# Patient Record
Sex: Male | Born: 1950 | Race: Black or African American | Hispanic: No | Smoking: Former smoker
Health system: Southern US, Community
[De-identification: ages and names within clinical notes are randomized; demographics above are authoritative.]

## PROBLEM LIST (undated history)

## (undated) DIAGNOSIS — I471 Supraventricular tachycardia, unspecified: Secondary | ICD-10-CM

## (undated) DIAGNOSIS — Z87891 Personal history of nicotine dependence: Secondary | ICD-10-CM

## (undated) DIAGNOSIS — G510 Bell's palsy: Secondary | ICD-10-CM

## (undated) DIAGNOSIS — E7800 Pure hypercholesterolemia, unspecified: Secondary | ICD-10-CM

## (undated) DIAGNOSIS — I2510 Atherosclerotic heart disease of native coronary artery without angina pectoris: Secondary | ICD-10-CM

## (undated) DIAGNOSIS — I10 Essential (primary) hypertension: Secondary | ICD-10-CM

## (undated) DIAGNOSIS — I499 Cardiac arrhythmia, unspecified: Secondary | ICD-10-CM

## (undated) HISTORY — DX: Supraventricular tachycardia, unspecified: I47.10

## (undated) HISTORY — PX: LITHOTRIPSY: SUR834

## (undated) HISTORY — DX: Supraventricular tachycardia: I47.1

## (undated) HISTORY — DX: Atherosclerotic heart disease of native coronary artery without angina pectoris: I25.10

## (undated) HISTORY — DX: Cardiac arrhythmia, unspecified: I49.9

## (undated) HISTORY — PX: CATARACT EXTRACTION, BILATERAL: SHX1313

## (undated) HISTORY — PX: CORONARY ARTERY BYPASS GRAFT: SHX141

## (undated) HISTORY — PX: CHOLECYSTECTOMY: SHX55

## (undated) HISTORY — DX: Personal history of nicotine dependence: Z87.891

## (undated) HISTORY — DX: Bell's palsy: G51.0

## (undated) HISTORY — DX: Essential (primary) hypertension: I10

## (undated) HISTORY — DX: Pure hypercholesterolemia, unspecified: E78.00

---

## 2005-09-23 ENCOUNTER — Encounter: Admission: RE | Admit: 2005-09-23 | Discharge: 2005-09-23 | Payer: Self-pay | Admitting: Cardiology

## 2005-09-24 ENCOUNTER — Ambulatory Visit (HOSPITAL_COMMUNITY): Admission: AD | Admit: 2005-09-24 | Discharge: 2005-09-25 | Payer: Self-pay | Admitting: Cardiology

## 2005-09-25 ENCOUNTER — Encounter: Payer: Self-pay | Admitting: Vascular Surgery

## 2005-09-29 ENCOUNTER — Inpatient Hospital Stay (HOSPITAL_COMMUNITY): Admission: RE | Admit: 2005-09-29 | Discharge: 2005-10-03 | Payer: Self-pay | Admitting: Cardiothoracic Surgery

## 2005-10-30 ENCOUNTER — Encounter: Admission: RE | Admit: 2005-10-30 | Discharge: 2005-10-30 | Payer: Self-pay | Admitting: Cardiothoracic Surgery

## 2006-01-26 ENCOUNTER — Ambulatory Visit (HOSPITAL_COMMUNITY): Admission: RE | Admit: 2006-01-26 | Discharge: 2006-01-26 | Payer: Self-pay | Admitting: Internal Medicine

## 2007-11-21 ENCOUNTER — Encounter: Admission: RE | Admit: 2007-11-21 | Discharge: 2007-11-21 | Payer: Self-pay | Admitting: Cardiology

## 2007-12-20 ENCOUNTER — Ambulatory Visit (HOSPITAL_COMMUNITY): Admission: RE | Admit: 2007-12-20 | Discharge: 2007-12-20 | Payer: Self-pay | Admitting: Cardiology

## 2007-12-30 ENCOUNTER — Ambulatory Visit (HOSPITAL_COMMUNITY): Admission: RE | Admit: 2007-12-30 | Discharge: 2007-12-30 | Payer: Self-pay | Admitting: Cardiology

## 2009-02-04 ENCOUNTER — Ambulatory Visit (HOSPITAL_COMMUNITY): Admission: RE | Admit: 2009-02-04 | Discharge: 2009-02-04 | Payer: Self-pay | Admitting: Urology

## 2010-01-12 ENCOUNTER — Emergency Department (HOSPITAL_COMMUNITY): Admission: EM | Admit: 2010-01-12 | Discharge: 2010-01-12 | Payer: Self-pay | Admitting: Emergency Medicine

## 2010-01-13 ENCOUNTER — Encounter (HOSPITAL_COMMUNITY): Admission: RE | Admit: 2010-01-13 | Discharge: 2010-03-19 | Payer: Self-pay | Admitting: Cardiology

## 2010-09-27 LAB — GLUCOSE, CAPILLARY: Glucose-Capillary: 190 mg/dL — ABNORMAL HIGH (ref 70–99)

## 2010-09-27 LAB — COMPREHENSIVE METABOLIC PANEL
ALT: 33 U/L (ref 0–53)
Albumin: 3.5 g/dL (ref 3.5–5.2)
Chloride: 109 mEq/L (ref 96–112)
Glucose, Bld: 202 mg/dL — ABNORMAL HIGH (ref 70–99)
Potassium: 3.7 mEq/L (ref 3.5–5.1)
Sodium: 142 mEq/L (ref 135–145)
Total Protein: 7.6 g/dL (ref 6.0–8.3)

## 2010-11-07 NOTE — Discharge Summary (Signed)
NAME:  Mark Fernandez, Mark Fernandez NO.:  [id number]   MEDICAL RECORD NO.:  [id number]          PATIENT TYPE:  INP   LOCATION:  2003                         FACILITY:  [facility]   PHYSICIAN:  Kerin Perna, M.D.  DATE OF BIRTH:  September 15, 1950   DATE OF ADMISSION:  09/29/2005  DATE OF DISCHARGE:                                 DISCHARGE SUMMARY   ADMISSION DIAGNOSIS:  Class IV unstable angina.   PAST MEDICAL HISTORY/DISCHARGE DIAGNOSES:  1.  History of nonsustained ventricular tachycardia.  2.  Diabetes mellitus type 2.  3.  Hypertension.  4.  Hypercholesterolemia.  5.  Angina.  6.  History of tobacco abuse.  7.  Paroxysmal supraventricular tachycardia.  8.  Ejection fraction of 47%.  9.  Coronary artery disease, status post coronary artery bypass grafting x4.   ALLERGIES:  No known drug allergies.   BRIEF HISTORY:  The patient is a 60 year old African-American male with a  history of hypertension, diabetes, and tobacco abuse, who has been followed  by Dr. Elsie Lincoln as an outpatient.  He presented with symptoms of progressive  and accelerating angina.  The patient's Persantine Cardiolite on September 22, 2005 was found to be abnormal with ischemia in the left anterior descending  territory.  He then underwent cardiac cath by Dr. Elsie Lincoln, which demonstrated  severe two vessel coronary artery disease not amenable to percutaneous  intervention.  Dr. Kathlee Nations Trigt of the CVTS service was then subsequently  consulted regarding surgical revascularization.  Dr. Donata Clay evaluated on  the patient on September 24, 2005, and it was his opinion that the patient should  proceed with coronary artery bypass graft surgery.  The patient was in  stable condition and was allowed to have a leave of absence and return when  his surgery could be scheduled.  The patient then returned on September 29, 2005  for surgical revascularization.   HOSPITAL COURSE:  The patient was admitted on September 29, 2005 and taken  to  the OR for coronary artery bypass grafting x4.  Endoscopic __________  harvesting was performed on the right lower extremity.  The left internal  mammary artery was grafted to the LAD, saphenous vein was grafted to the  diagonal, saphenous vein was grafted to the obtuse marginal, and saphenous  vein was grafted to the right coronary artery.  The patient tolerated the  procedure well and was hemodynamically stable immediately postoperatively.  The patient was transferred from the OR to the SICU in stable condition.  The patient was extubated without complication and woke up from anesthesia  neurologically intact.   The patient's postoperative course progressed as expected.  On postoperative  day #1, he was afebrile with stable vital signs and maintaining a normal  sinus rhythm.  All drips were weaned accordingly, and all chest tubes and  invasive lines were discontinued in a routine manner.  Patient had some  volume overload postoperatively and has been diuresed accordingly.  He will  require additional diuresis as an outpatient.   The patient was transfer from the SICU to unit 2000, again without  difficulty.  He began cardiac rehab on postoperative day #2 and has  increased his tolerance to a satisfactory level at this time.   On postoperative day #3, the patient has a low-grade temperature of 100.8.  His other vital signs are stable, and he is maintained in a normal sinus  rhythm.  His chest x-ray is clear.  On physical exam, his incisions are  healing well.  Cardiac is a regular rate and rhythm, and the lungs are clear  to auscultation.   The patient is currently in stable condition.  As long as he continues to  progress in the current manner and his fever resolves, he will be ready for  discharge home within the next 1-2 days, pending morning round re-  evaluation.   LABS:  CBC on October 02, 2005, hemoglobin 10.8, hematocrit 31.4, white count  11.8, platelets 175.  BNP on  October 02, 2005:  Sodium 140, potassium 3.8, BUN  11, creatinine 1, glucose 87.   CONDITION ON DISCHARGE:  Improved.   INSTRUCTIONS:  1.  Medications:  Aspirin 81 mg daily, Coreg 25 mg b.i.d., Altace 5 mg      daily, Zocor 40 mg daily, Plavix 75 mg daily, Lasix 40 mg daily x5 days,      K-Dur 20 mEq daily x5 days, Avandia 4 mg b.i.d., glipizide/Metformin      5/500 mg 2 tabs q.a.m. and q.p.m., Ultram 50 mg 1-2 q.4-6h. p.r.n. pain.  2.  Activity:  No driving.  No lifting more than 10 pounds.  The patient is      to continue daily breathing and walking exercises.  3.  Diet:  Low salt, low fat, carbohydrate-modified medium calorie.  4.  Wound care:  The patient may shower daily and clean the incisions with      soap and water.  If wound problems arise, patient is to contact the CVTS      office.   FOLLOW-UP APPOINTMENTS:  1.  Dr. Elsie Lincoln two weeks after discharge.  Patient should contact his office      for an appointment date and time.  2.  [facility] on Oct 30, 2005 at 9:45 a.m. for chest x-      ray, PA and lateral.  3.  With Dr. Donata Clay on Oct 30, 2005 at 10:45 a.m.      Pecola Leisure, [place]      Kerin Perna, M.D.  Electronically Signed    AY/MEDQ  D:  10/02/2005  T:  10/02/2005  Job:  [id number]   cc:   Madaline Savage, M.D.  Fax: [phone]

## 2010-11-07 NOTE — Op Note (Signed)
NAME:  Mark Fernandez, Mark Fernandez NO.:  [id number]   MEDICAL RECORD NO.:  [id number]          PATIENT TYPE:  INP   LOCATION:  2302                         FACILITY:  [facility]   PHYSICIAN:  Kerin Perna, M.D.  DATE OF BIRTH:  Sep 12, 1950   DATE OF PROCEDURE:  09/29/2005  DATE OF DISCHARGE:                                 OPERATIVE REPORT   OPERATION:  Coronary artery bypass grafting x4 (left internal mammary artery  to left anterior descending, saphenous vein graft to diagonal, saphenous  vein graft to obtuse marginal, saphenous vein graft to right coronary  artery).   PREOPERATIVE DIAGNOSIS:  Class IV unstable angina with severe three vessel  coronary artery disease.   POSTOPERATIVE DIAGNOSIS:  Class IV unstable angina with severe three vessel  coronary artery disease.   SURGEON:  Kerin Perna, M.D.   ASSISTANT:  Angelina Sheriff. Wortman, P.A.-C.   ANESTHESIA:  General by Dr. Diamantina Monks.   INDICATIONS:  The patient is a 60 year old of black male with hypertension,  diabetes, and smoking, who presented with symptoms of progressive and  accelerating angina.  Cardiac catheterization by Dr. Elsie Lincoln demonstrated  severe two-vessel coronary disease not amendable to percutaneous  intervention.  Coronary bypass grafting was recommended.   Prior to surgery, I examined the patient in his hospital room and reviewed  the results of the cardiac catheterization with the patient.  I discussed  the indications and expected benefits of coronary artery bypass surgery for  treatment of his coronary disease.  I reviewed the alternatives to surgical  therapy, as well.  I discussed with the patient the major aspects of the  planned procedure including the choice of conduit to include internal  mammary artery and endoscopically harvested saphenous vein, the location of  the surgical incisions, use of general anesthesia and cardiopulmonary  bypass, and expected postoperative hospital recovery.  I  discussed with the  patient the risks to him of coronary bypass surgery including risks of MI,  CVA, bleeding, infection, and death.  He understood these implications for  the surgery and agreed to proceed with the operation as planned under what I  felt was an informed consent.   OPERATIVE FINDINGS:  The patient had severe coronary artery disease with  targets that were too small to graft in the distal right and distal  circumflex circulation.  There were no focal areas of myocardial scar or  fibrosis.  The mammary artery was an excellent conduit.  The vein was an  above average conduit.  The patient received a unit of platelets after  reversal of heparin with protamine due to persistent coagulopathy.  The  patient received Amicar to help reduce bleeding complications.   PROCEDURE:  The patient was brought to operating room and placed supine on  the operating table.  General anesthesia was induced under invasive  hemodynamic monitoring.  The chest, abdomen and legs were prepped with  Betadine and draped as a sterile field.  A sternal incision was made and the  saphenous vein was harvested endoscopically from the right leg.  The entire  right  leg vein was used.  The left internal mammary artery was harvested as  a pedicle graft from its origin at the subclavian vessels and was a good  vessel with excellent flow.  Heparin was administered and ACT was documented  as being therapeutic.  The sternal retractor was placed and the pericardium  was opened and elevated.  Purse-strings were placed in the ascending aorta  and right atrium and the patient was cannulated and placed on bypass.  The  coronaries were identified for grafting.  The mammary artery and vein grafts  were prepared for the distal anastomoses.  Cardioplegia catheters placed in  both the ascending aorta and into the coronary sinus for antegrade and  retrograde cold blood cardioplegia.  The patient was cooled to 32 degrees.  The  aortic crossclamp was applied.  800 mL of cold blood cardioplegia was  delivered in split doses between the aortic and coronary sinus cardioplegia  catheters.  There was a good cardioplegic arrest and septal temperature  dropped to less than 12 degrees.  Topical iced saline was used to augment  myocardial preservation.   The distal coronary anastomoses were performed.  The first distal  anastomosis was the distal right coronary.  This was before the posterior  descending takeoff.  It was filled with heavy plaque and cholesterol.  A  reverse saphenous vein was sewn end-to-side with running 7-0 Prolene, there  was good flow through the graft.  The second distal anastomosis was the  first diagonal branch off the LAD.  There is a small 1.2-mm vessel with a  proximal 90% stenosis.  A reverse saphenous vein was sewn end-to-side with  running 7-0 Prolene.  There is adequate flow through graft.  Cardioplegia  was redosed.  The third distal anastomosis was the distal portion of the  OM1.  This was heavily and diffusely diseased.  It is a 1.5 mm vessel.  A  reverse saphenous vein was sewn end-to-side with running 7-0 Prolene, there  was good flow through the graft.  The fourth distal anastomosis was to the  mid aspect of the LAD.  This was a 1.75-mm vessel with a proximal 90%  stenosis.  The left internal mammary artery pedicle was brought through an  opening created in the left lateral pericardium and was brought down onto  the LAD and sewn end-to-side with a running 8-0 Prolene.  There was  excellent flow through the anastomosis after briefly releasing the pedicle  bulldog on the mammary pedicle.  The bulldog was replaced and the pedicle  was secured epicardium.   Cardioplegia was redosed.  While the crossclamp was still in place, three  proximal vein anastomoses were placed on the ascending aorta using a 4.0-mm punch and running 6-0 Prolene.  Prior to tying down the final proximal  anastomosis,  the air was vented from the coronaries and the left side of  heart using a dose of retrograde warm blood cardioplegia and the usual de-  airing maneuvers on bypass.  The final proximal anastomosis was then tied  down and the crossclamp was removed.   The heart was cardioverted back to a regular rhythm.  Air was aspirated from  the vein grafts with 27 gauge needle.  The vein grafts were opened and each  had good flow and hemostasis was documented at the proximal and distal  anastomoses.  The patient was rewarmed to 37 degrees.  Temporary pacing  wires were applied.  The lungs were re-expanded and the ventilator  was  resumed.  The patient was then weaned from bypass without inotropes.  Blood  pressure and cardiac output were stable.  Protamine was administered without  adverse reaction.  The cannulae were removed.  The mediastinum was irrigated  with warm antibiotic irrigation.  The leg incision was irrigated and closed  in a standard fashion.  The patient had persistent significant bleeding from  coagulopathy and was given platelets and more Amicar.  The patient continued  to be hemodynamically stable.  The superior pericardial fat was closed.  Two  mediastinal and a left pleural chest tube were placed and brought out  through separate incisions.  The sternum was  closed with interrupted steel wire.  The pectoralis fascia was closed with a  running #1 Vicryl.  The subcutaneous and skin layers were closed with  running Vicryl and sterile dressings were applied.  Total bypass time was  130 minutes, crossclamp time was 80 minutes.      Kerin Perna, M.D.  Electronically Signed     PV/MEDQ  D:  09/29/2005  T:  09/29/2005  Job:  [id number]   cc:   Madaline Savage, M.D.  Fax: [phone]

## 2010-11-07 NOTE — Consult Note (Signed)
NAME:  Mark Fernandez, Mark Fernandez NO.:  [id number]   MEDICAL RECORD NO.:  [id number]          PATIENT TYPE:  OIB   LOCATION:  2006                         FACILITY:  [facility]   PHYSICIAN:  Madaline Savage, M.D.DATE OF BIRTH:  [birth year]   DATE OF CONSULTATION:  DATE OF DISCHARGE:  09/25/2005                                   CONSULTATION   INTERIM LOA NOTE:   DATE OF LOA:  September 25, 2005.   Return for bypass grafting on September 29, 2005.   INTERIM DIAGNOSIS:  1.  Significant coronary artery disease with plans for coronary artery      bypass grafting September 29, 2005, by Dr. Kathlee Nations Trigt.  2.  History of nonsustained ventricular tachycardia.  3.  Diabetes mellitus 2.  4.  Hypertension.  5.  Hypercholesterolemia.  6.  Angina.  7.  History of tobacco use.  8.  Paroxysmal supraventricular tachycardia.  9.  Abnormal Persantine Cardiolite September 22, 2005, with ischemia in the left      anterior descending territory.  10. Left ventricular dysfunction, ejection fraction 47%.   DISCHARGE CONDITION ON LOA:  Stable.  No angina.  On Imdur.   INTERIM MEDICATIONS:  1.  Imdur 30 mg one daily.  2.  Vytorin 10/20 one daily.  3.  Metoprolol 50 mg 1/2 tablet twice a day.  4.  Avandia 4 mg twice a day.  5.  Hold glipizide, metformin until Saturday evening then as before.  6.  Lotrel 5/20 one daily.  7.  Aspirin 325 daily.  8.  Ativan 0.5 mg one every 4 hours as needed for anxiety.  9.  Take bisacodyl 5 mg on Monday one time only that morning.  10. Shower chin to toes twice the night before surgery with 16 mL of      Hibiclens.  11. Follow OR instructions, arriving at [facility] at 5:30 on September 29, 2005.  12. Nitroglycerin sublingual p.r.n. chest pain.   1.  Wash right groin cath site with soap and water.  Call if any bleeding,      swelling or drainage.  2.  No strenuous activity.   HISTORY OF PRESENT ILLNESS:  This 60 year old single African-American male  works for the  [employer].  Developed paroxysmal ventricular  tachycardia that was documented on his first visit in April 2007.  Unsure if  it is mis-dictation on September 23, 2005.  In the September 21, 2005, when he was in  the office with tachycardia it revealed supraventricular tachycardia with  retrograde P-waves at the tail end of the QRS, that actually broke in the  office and he went back to a sinus rhythm.  He was started on metoprolol and  then underwent 2D echo and Cardiolite study.  The Cardiolite study was  positive for ischemia in the LAD territory, an EF of 47%, and the 2D echo  with normal LV function, there was impaired LV relaxation, no significant  valvular disease.   FAMILY HISTORY:  Mother had hypertension and diabetes.  He unsure why  his  father died.  Brothers and sisters no obvious knowledge of coronary disease.   SOCIAL HISTORY:  Single with 4 children, 7 grandchildren.  He does not  exercise, but works with the [employer].  Continues to smoke 1/2 to  a whole pack a day and does do social alcohol.   REVIEW OF SYSTEMS:  See H&P.   PHYSICAL EXAMINATION:  VITALS AT LOA:  Blood pressure 146/88, pulse 58,  respiratory rate 20, afebrile.  Room air oxygen saturation 98%.  LUNGS:  Were clear.  HEART:  Regular rate and rhythm.  ABDOMEN:  Positive bowel sounds.  EXTREMITIES:  Without edema.   LABORATORY DATA:  Prior to discharge, INR 1.1, blood gases 7.396, pCO2 46,  pO2 83, bicarb 26, 96% sat on room air.  Sodium 138, potassium 3.6, BUN 10,  creatinine 1.0, glucose 114, total bili 0.7, AST 15, ALT 13, total protein  6.7, albumin 3.5, calcium 8.8.  Hematocrit 34, platelets 306 and WBC 6.  Dopplers of carotids:  No ICA stenosis bilaterally, vertebral artery flow  antegrade and lower extremity ABIs within normal limits.   HOSPITAL COURSE UNTIL RETURN:  Patient was found to have a positive  Cardiolite, underwent cardiac catheterization September 24, 2005, and was found  to have  significant disease, 75% RCA near the ostium, PLA 99%, PDA 99%, LAD  diagonal has 90%, LAD itself 85 and 75%, OM-1 75%, circ 95%, EF was 50-60%.  Patient was evaluated by Dr. Donata Clay, for CVTS, and bypass grafting was  recommended.   PLANS:  On the morning of LOA his heparin and nitroglycerin were dc'd, he  ambulated without any chest pain, he was placed on Imdur to cover him while  he was gone, as well as nitroglycerin sublingual, and he was sent out on LOA  to return on Tuesday morning for surgery.   Please note, this chart should go to [facility] for continuation of care.      Darcella Gasman. Annie Paras, N.P.    ______________________________  Madaline Savage, M.D.    LRI/MEDQ  D:  09/25/2005  T:  09/25/2005  Job:  [id number]

## 2010-11-07 NOTE — Cardiovascular Report (Signed)
NAME:  Mark Fernandez, Mark Fernandez NO.:  [id number]   MEDICAL RECORD NO.:  [id number]          PATIENT TYPE:  OIB   LOCATION:  2807                         FACILITY:  [facility]   PHYSICIAN:  Madaline Savage, M.D.DATE OF BIRTH:  [birth year]   DATE OF PROCEDURE:  09/24/2005  DATE OF DISCHARGE:                              CARDIAC CATHETERIZATION   PROCEDURES PERFORMED:  1.  Selective coronary angiography by Judkins technique.  2.  Retrograde left heart catheterization.  3.  Left ventricular angiography.  4.  Abdominal aortography with bifemoral runoff.   COMPLICATIONS:  None.   ENTRY SITE:  Right femoral.   DYE USED:  Omnipaque.   CATHETERS USED:  5-French Cordis catheters with Judkins configuration   PATIENT PROFILE:  The patient is a pleasant 60 year old, diabetic, African-  American gentleman who is a medical patient Dr. Margaretmary Bayley, who works for  the [employer].  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  For the last 1-1/2-to-2 weeks the patient  has had fairly persistent shortness of breath and chest discomfort that  waxes and wanes in terms of severity.  Prior to this time, he had had that  occasionally.  He saw our group, in the office, for the first time on March  6; and, at that time, he was complaining of rapid heart rate.  He was found  to be in paroxysmal supraventricular tachycardia and metoprolol and he  spontaneously reverted out of it.  We then got further testing and found out  that his Cardiolite stress test showed LAD ischemia.  We thereafter  scheduled him for outpatient Cardiolite today.  Past history shows that he  has been diabetic since 1995 and treated with oral agents.  He has also been  hypertensive since 1995.  He has hypertriglyceridemia and is taking aspirin.  He smokes 1/2 pack of cigarettes a day; and has smoked since he was 60 years  old.  Today the catheterization was completed on an  outpatient basis without  complications.  He is going to be admitted based on his anatomy.   RESULTS:  Pressures:  The left ventricular pressure was 160/12, end-  diastolic pressure 15, central aortic pressure 165/80, mean of 115, no  aortic valve gradient by pullback technique.   ANGIOGRAPHIC RESULTS:  1.  The coronary arteries show calcification along the proximal LAD.  No      valvular pericardial calcifications is seen.  2.  The left main coronary artery is fairly long, medium to large in size,      with no significant lesions, or any significant calcifications.  3.  The LAD contains proximal calcification and shows a lesion of 85% just      beyond the diagonal branch.  There is a second lesion in the LAD of      approximately 75% distal to it, making it a long segment of diseased mid      LAD.  4.  The distal LAD is bypassable and contains scattered irregularities.  5.  A diagonal branch of the LAD arising  before septal perforator branch #1      contains 90% ostial disease; and then has a lumpy-bumpy disease      proximally that is greater than 75% in multiple areas.  There is distal      diagonal that does appear to be bypassable.  6.  The left circumflex coronary artery is a large vessel giving rise to a      large first obtuse marginal branch.  A second fairly large obtuse      marginal branch, and then the circumflex terminates as a posterolateral      branch.  The circumflex, itself, appears normal until after the second      obtuse marginal branch at which point there is a concentric stenosis of      95% the distal runoff beyond the stenosis in the distal circumflex, is      questionably bypassable.  Both obtuse marginal branches #1 and #2      contained stenoses of 75% or greater proximally.  Both are bypassable      distally.  7.  Right coronary artery is a fairly small vessel with a long diffusely and      severely diseased posterior descending branch which is  questionably      bypassable.  The proximal RCA contains 75% eccentric stenoses.  There is      also a 95% stenosis at the proximal portion of an acute marginal branch.  8.  The left ventricle shows good contractility globally and an ejection      fraction estimate of 50-60%.  Ventricular ectopy precludes judgment on      whether or not there are regional wall motion abnormalities, although I      do not think so.  There is no mitral regurgitation seen in the LV      thrombus.  9.  Abdominal aortography shows a smooth abdominal aorta and both common      iliacs, and internal and external iliacs appear free of any significant      high-grade disease.  There are also normal renal arteries.  Renal artery      studies were done because the patient had had hypertension since 1995.  10. The left subclavian coronary artery was smooth and normal.  The left      internal mammary artery appears to be of adequate caliber to support      internal mammary artery grafting.   FINAL DIAGNOSIS:  1.  Longstanding history of cardiac risk factors including diabetes since      1995; hypertension since 1995; family history of coronary disease and      smoking.  2.  Multivessel coronary artery disease      1.  High-grade stenosis of LAD.      2.  High-grade stenosis of ostial diagonal.  3.  High-grade stenosis of distal circumflex; high-grade stenosis of both      obtuse marginal branches; high-grade stenosis proximal RCA and virtually      occluded posterior descending branch questionably bypassable.  4.  Normal LV systolic function.  5.  Normal abdominal aorta.  6.  Normal renal arteries.   PLAN:  The patient will be admitted for beta-blocker additional therapy,  initiation of heparin, and IV nitroglycerin, and a cardiovascular thoracic  surgical consult will be obtained.           ______________________________  Madaline Savage, M.D.    WHG/MEDQ  D:  09/24/2005  T:  09/25/2005  Job:   [id number]   cc:   Margaretmary Bayley, M.D.  Fax: [phone]   [facility]   Madaline Savage, M.D.  Fax: [phone]

## 2011-02-09 ENCOUNTER — Encounter: Payer: Self-pay | Admitting: Cardiology

## 2011-02-10 ENCOUNTER — Ambulatory Visit (INDEPENDENT_AMBULATORY_CARE_PROVIDER_SITE_OTHER): Payer: Medicare Other | Admitting: Cardiology

## 2011-02-10 ENCOUNTER — Encounter: Payer: Self-pay | Admitting: Cardiology

## 2011-02-10 VITALS — BP 148/88 | Ht 64.5 in | Wt 221.0 lb

## 2011-02-10 DIAGNOSIS — E663 Overweight: Secondary | ICD-10-CM | POA: Insufficient documentation

## 2011-02-10 DIAGNOSIS — E7800 Pure hypercholesterolemia, unspecified: Secondary | ICD-10-CM

## 2011-02-10 DIAGNOSIS — E782 Mixed hyperlipidemia: Secondary | ICD-10-CM

## 2011-02-10 DIAGNOSIS — I499 Cardiac arrhythmia, unspecified: Secondary | ICD-10-CM | POA: Insufficient documentation

## 2011-02-10 DIAGNOSIS — I428 Other cardiomyopathies: Secondary | ICD-10-CM

## 2011-02-10 DIAGNOSIS — I10 Essential (primary) hypertension: Secondary | ICD-10-CM

## 2011-02-10 DIAGNOSIS — I2510 Atherosclerotic heart disease of native coronary artery without angina pectoris: Secondary | ICD-10-CM | POA: Insufficient documentation

## 2011-02-10 DIAGNOSIS — I429 Cardiomyopathy, unspecified: Secondary | ICD-10-CM | POA: Insufficient documentation

## 2011-02-10 MED ORDER — PRAVASTATIN SODIUM 20 MG PO TABS: 20.0000 mg | ORAL_TABLET | Freq: Every evening | ORAL | Status: DC

## 2011-02-10 NOTE — Patient Instructions (Signed)
Follow up in 1 year with Dr Hochrein.  You will receive a letter in the mail 2 months before you are due.  Please call us when you receive this letter to schedule your follow up appointment.  The current medical regimen is effective;  continue present plan and medications.  

## 2011-02-10 NOTE — Assessment & Plan Note (Signed)
I will look at the EF on his nuclear study.  He has no CHF symptoms.   No change in therapy is indicated.

## 2011-02-10 NOTE — Assessment & Plan Note (Signed)
The patient understands the need to lose weight with diet and exercise. We have discussed specific strategies for this.  

## 2011-02-10 NOTE — Assessment & Plan Note (Signed)
His BP is very slightly elevated.  I asked him to continue current meds and to lose 5 lbs or more to get him to target.

## 2011-02-10 NOTE — Assessment & Plan Note (Signed)
He has no symptomatic palpitations. No change in therapy is indicated.

## 2011-02-10 NOTE — Assessment & Plan Note (Signed)
I will do for management of this and he is diabetes to Dr. Chestine Spore. The patient is on pravastatin.

## 2011-02-10 NOTE — Progress Notes (Signed)
HPI The patient presents to establish with a new cardiologist.  He has a history of CAD.  He does report having a stress nuclear study that was "normal" within the past year.  He has his cardiovascular risk factors call followed closely by Dr. Chestine Spore. 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 For years he's had some dyspnea with exertion and he says this has not been changed. The patient denies any new symptoms such as chest discomfort, neck or arm discomfort. There has been no new shortness of breath, PND or orthopnea. There have been no reported palpitations, presyncope or syncope.  No Known Allergies  Current Outpatient Prescriptions  Medication Sig Dispense Refill  . aspirin 325 MG tablet Take 325 mg by mouth daily.        . carvedilol (COREG) 12.5 MG tablet Take 12.5 mg by mouth 2 (two) times daily with a meal.        . clopidogrel (PLAVIX) 75 MG tablet Take 75 mg by mouth daily.        . diclofenac (VOLTAREN) 75 MG EC tablet Take 75 mg by mouth 2 (two) times daily.        . hydrochlorothiazide (,MICROZIDE/HYDRODIURIL,) 12.5 MG capsule Take 12.5 mg by mouth daily.        . insulin lispro protamine-insulin lispro (HUMALOG 75/25) (75-25) 100 UNIT/ML SUSP Inject 100 Units into the skin 2 (two) times daily with a meal.        . metFORMIN (GLUMETZA) 500 MG (MOD) 24 hr tablet Take 500 mg by mouth 2 (two) times daily with a meal.        . Omega-3 Fatty Acids (FISH OIL) 1000 MG CAPS Take 1 capsule by mouth 2 (two) times daily.        Marland Kitchen omeprazole (PRILOSEC) 20 MG capsule Take 20 mg by mouth daily.        . vitamin B-12 (CYANOCOBALAMIN) 500 MCG tablet Take 500 mcg by mouth 2 (two) times daily.        . pravastatin (PRAVACHOL) 20 MG tablet Take 1 tablet (20 mg total) by mouth every evening.  30 tablet  11    Past Medical History  Diagnosis Date  . Arrhythmia     NSVT  . Diabetes mellitus     type II  . Hypertension   . Hypercholesteremia   . History of  tobacco use   . Coronary artery disease     status post CABG X 4  (left internal mammary artery to left anterior descending, saphenous vein graft to diagonal, saphenous  vein graft to obtuse marginal, saphenous vein graft to right coronary   artery).  . SVT (supraventricular tachycardia)   . Cardiomyopathy     EF 45%  . Bell's palsy     Past Surgical History  Procedure Date  . Cholecystectomy   . Lithotripsy   . Coronary artery bypass graft   . Cataract extraction, bilateral     Family History  Problem Relation Age of Onset  . Hypertension Mother   . Diabetes Mother     History   Social History  . Marital Status: Widowed    Spouse Name: N/A    Number of Children: N/A  . Years of Education: N/A   Occupational History  . Not on file.   Social History Main Topics  . Smoking status: Former Smoker    Quit date: 02/10/2003  . Smokeless tobacco: Not on file  .  Alcohol Use: Yes  . Drug Use: Not on file  . Sexually Active: Not on file   Other Topics Concern  . Not on file   Social History Narrative    Single with 4 children, 7 grandchildren.  He does not exercise, but works with the [employer].  Continues to smoke 1/2 to a whole pack a day and does do social alcohol.    ROS:  Past positive for constipation, urinary frequency  , headaches and dizziness.  Otherwise as stated in the HPI and negative for all other systems.   PHYSICAL EXAM BP 148/88  Ht 5' 4.5" (1.638 m)  Wt 221 lb (100.245 kg)  BMI 37.35 kg/m2 GENERAL:  Well appearing HEENT:  Pupils equal round and reactive, fundi not visualized, oral mucosa unremarkable NECK:  No jugular venous distention, waveform within normal limits, carotid upstroke brisk and symmetric, no bruits, no thyromegaly LYMPHATICS:  No cervical, inguinal adenopathy LUNGS:  Clear to auscultation bilaterally BACK:  No CVA tenderness CHEST:  Well healed sternotomy scar. HEART:  PMI not displaced or sustained,S1 and S2 within  normal limits, no S3, no S4, no clicks, no rubs, no murmurs ABD:  Flat, positive bowel sounds normal in frequency in pitch, no bruits, no rebound, no guarding, no midline pulsatile mass, no hepatomegaly, no splenomegaly EXT:  2 plus pulses throughout, no edema, no cyanosis no clubbing SKIN:  No rashes no nodules NEURO:  Mild facial drop left side, motor grossly intact throughout PSYCH:  Cognitively intact, oriented to person place and time  EKG:  Sinus rhythm, rate 80, axis within normal limits, intervals within normal limits, early transition in lead V2, no acute ST-T wave changes.  ASSESSMENT AND PLAN

## 2011-02-10 NOTE — Assessment & Plan Note (Signed)
I will try to obtain the stress test done this year.  Otherwise, I will focus on risk reduction.

## 2011-05-06 ENCOUNTER — Ambulatory Visit (HOSPITAL_COMMUNITY)
Admission: RE | Admit: 2011-05-06 | Discharge: 2011-05-06 | Disposition: A | Discharge status: Home / Self Care | Payer: Medicare Other | Source: Ambulatory Visit | Attending: Internal Medicine | Admitting: Internal Medicine

## 2011-05-06 ENCOUNTER — Emergency Department (HOSPITAL_COMMUNITY)
Admission: EM | Admit: 2011-05-06 | Discharge: 2011-05-06 | Discharge status: Against Medical Advice | Payer: Medicare Other

## 2011-05-06 DIAGNOSIS — I10 Essential (primary) hypertension: Secondary | ICD-10-CM | POA: Insufficient documentation

## 2011-05-06 DIAGNOSIS — R52 Pain, unspecified: Secondary | ICD-10-CM

## 2011-05-06 DIAGNOSIS — M79609 Pain in unspecified limb: Secondary | ICD-10-CM | POA: Insufficient documentation

## 2011-05-06 DIAGNOSIS — R0989 Other specified symptoms and signs involving the circulatory and respiratory systems: Secondary | ICD-10-CM

## 2011-05-06 NOTE — Progress Notes (Signed)
*  PRELIMINARY RESULTS* Bilateral ABIs completed ABIs and Doppler waveforms are normal at rest. Symptoms are not consistent with claudication.  Mark Fernandez, [place] D 05/06/2011, 10:18 AM

## 2012-02-16 ENCOUNTER — Other Ambulatory Visit: Payer: Self-pay | Admitting: *Deleted

## 2012-02-16 ENCOUNTER — Encounter: Payer: Self-pay | Admitting: Cardiology

## 2012-02-16 ENCOUNTER — Ambulatory Visit (INDEPENDENT_AMBULATORY_CARE_PROVIDER_SITE_OTHER): Payer: Medicare Other | Admitting: Cardiology

## 2012-02-16 VITALS — BP 132/74 | HR 76 | Ht 64.0 in | Wt 223.0 lb

## 2012-02-16 DIAGNOSIS — I499 Cardiac arrhythmia, unspecified: Secondary | ICD-10-CM

## 2012-02-16 DIAGNOSIS — E782 Mixed hyperlipidemia: Secondary | ICD-10-CM

## 2012-02-16 DIAGNOSIS — E7800 Pure hypercholesterolemia, unspecified: Secondary | ICD-10-CM

## 2012-02-16 DIAGNOSIS — I2510 Atherosclerotic heart disease of native coronary artery without angina pectoris: Secondary | ICD-10-CM

## 2012-02-16 DIAGNOSIS — I10 Essential (primary) hypertension: Secondary | ICD-10-CM

## 2012-02-16 DIAGNOSIS — E663 Overweight: Secondary | ICD-10-CM

## 2012-02-16 MED ORDER — PRAVASTATIN SODIUM 20 MG PO TABS: 20.0000 mg | ORAL_TABLET | Freq: Every evening | ORAL | Status: DC

## 2012-02-16 MED ORDER — HYDROCHLOROTHIAZIDE 12.5 MG PO CAPS: 12.5000 mg | ORAL_CAPSULE | Freq: Every day | ORAL | Status: DC

## 2012-02-16 MED ORDER — CARVEDILOL 12.5 MG PO TABS: 12.5000 mg | ORAL_TABLET | Freq: Two times a day (BID) | ORAL | Status: DC

## 2012-02-16 NOTE — Patient Instructions (Addendum)
Please stop your Plavix. Continue all other medications as listed.  Follow up in 1 year with Dr Hochrein.  You will receive a letter in the mail 2 months before you are due.  Please call us when you receive this letter to schedule your follow up appointment.  

## 2012-02-16 NOTE — Progress Notes (Signed)
HPI The patient presents to follow up with a history of CAD.  Since I last saw him he has done well.  The patient denies any new symptoms such as chest discomfort, neck or arm discomfort. There has been no new shortness of breath, PND or orthopnea. There have been no reported palpitations, presyncope or syncope.  He does push a lawnmower once a week. He walks occasionally for exercise. With this he gets none of the symptoms that was his previous angina. He does admit to not following any particular diet.  No Known Allergies  Current Outpatient Prescriptions  Medication Sig Dispense Refill  . aspirin 325 MG tablet Take 325 mg by mouth daily.        . carvedilol (COREG) 12.5 MG tablet Take 12.5 mg by mouth 2 (two) times daily with a meal.        . clopidogrel (PLAVIX) 75 MG tablet Take 75 mg by mouth daily.        . diclofenac (VOLTAREN) 75 MG EC tablet Take 75 mg by mouth 2 (two) times daily.        . hydrochlorothiazide (,MICROZIDE/HYDRODIURIL,) 12.5 MG capsule Take 12.5 mg by mouth daily.        . insulin lispro protamine-insulin lispro (HUMALOG 75/25) (75-25) 100 UNIT/ML SUSP Inject 100 Units into the skin 2 (two) times daily with a meal.        . metFORMIN (GLUMETZA) 500 MG (MOD) 24 hr tablet Take 500 mg by mouth 2 (two) times daily with a meal.        . Omega-3 Fatty Acids (FISH OIL) 1000 MG CAPS Take 1 capsule by mouth 2 (two) times daily.        Marland Kitchen omeprazole (PRILOSEC) 20 MG capsule Take 20 mg by mouth daily.        . pravastatin (PRAVACHOL) 20 MG tablet Take 1 tablet (20 mg total) by mouth every evening.  30 tablet  11  . vitamin B-12 (CYANOCOBALAMIN) 500 MCG tablet Take 500 mcg by mouth 2 (two) times daily.          Past Medical History  Diagnosis Date  . Arrhythmia     NSVT  . Diabetes mellitus     type II  . Hypertension   . Hypercholesteremia   . History of tobacco use   . Coronary artery disease     status post CABG X 4  (left internal mammary artery to left anterior  descending, saphenous vein graft to diagonal, saphenous  vein graft to obtuse marginal, saphenous vein graft to right coronary   artery).  . SVT (supraventricular tachycardia)   . Cardiomyopathy     EF 45%  . Bell's palsy     Past Surgical History  Procedure Date  . Cholecystectomy   . Lithotripsy   . Coronary artery bypass graft   . Cataract extraction, bilateral     ROS:  As stated in the HPI and negative for all other systems.   PHYSICAL EXAM BP 132/74  Pulse 76  Ht 5\' 4"  (1.626 m)  Wt 223 lb (101.152 kg)  BMI 38.28 kg/m2 GENERAL:  Well appearing HEENT:  Pupils equal round and reactive, fundi not visualized, oral mucosa unremarkable NECK:  No jugular venous distention, waveform within normal limits, carotid upstroke brisk and symmetric, no bruits, no thyromegaly LYMPHATICS:  No cervical, inguinal adenopathy LUNGS:  Clear to auscultation bilaterally BACK:  No CVA tenderness CHEST:  Well healed sternotomy scar. HEART:  PMI not  displaced or sustained,S1 and S2 within normal limits, no S3, no S4, no clicks, no rubs, no murmurs ABD:  Flat, positive bowel sounds normal in frequency in pitch, no bruits, no rebound, no guarding, no midline pulsatile mass, no hepatomegaly, no splenomegaly EXT:  2 plus pulses throughout, no edema, no cyanosis no clubbing SKIN:  No rashes no nodules NEURO:  Mild facial drop left side, motor grossly intact throughout PSYCH:  Cognitively intact, oriented to person place and time  EKG:  Sinus rhythm, rate 71, axis within normal limits, intervals within normal limits, no acute ST-T wave changes.  02/16/2012   ASSESSMENT AND PLAN  Coronary artery disease -  He had a negative stress perfusion study last year. He has no new symptoms. No further imaging is indicated. He can stop his Plavix.   Cardiomyopathy - EF was 51%.  He seems to be euvolemic.  At this point, no change in therapy is indicated.  We have reviewed salt and fluid restrictions.  No  further cardiovascular testing is indicated.   Overweight - The patient understands the need to lose weight with diet and exercise. We have discussed specific strategies for this.   Hypertension -  The blood pressure is at target. No change in medications is indicated. We will continue with therapeutic lifestyle changes (TLC).   Hypercholesteremia - I will defer to Dr. Kemper Durie with a goal LD less than 100 and HDL greater than 40.

## 2012-10-25 ENCOUNTER — Encounter: Payer: Self-pay | Admitting: *Deleted

## 2012-11-23 NOTE — Telephone Encounter (Signed)
This encounter was created in error - please disregard.

## 2013-02-24 ENCOUNTER — Other Ambulatory Visit: Payer: Self-pay | Admitting: Cardiology

## 2013-04-19 ENCOUNTER — Other Ambulatory Visit (HOSPITAL_COMMUNITY): Payer: Self-pay | Admitting: Cardiology

## 2013-04-19 DIAGNOSIS — R0600 Dyspnea, unspecified: Secondary | ICD-10-CM

## 2013-04-19 DIAGNOSIS — I70219 Atherosclerosis of native arteries of extremities with intermittent claudication, unspecified extremity: Secondary | ICD-10-CM

## 2013-04-19 DIAGNOSIS — Z951 Presence of aortocoronary bypass graft: Secondary | ICD-10-CM

## 2013-05-10 ENCOUNTER — Encounter (HOSPITAL_COMMUNITY): Payer: Medicare Other

## 2013-05-10 ENCOUNTER — Ambulatory Visit (HOSPITAL_COMMUNITY): Payer: Medicare Other | Attending: Cardiology

## 2014-07-30 DIAGNOSIS — Z961 Presence of intraocular lens: Secondary | ICD-10-CM | POA: Diagnosis not present

## 2014-07-30 DIAGNOSIS — H40013 Open angle with borderline findings, low risk, bilateral: Secondary | ICD-10-CM | POA: Diagnosis not present

## 2014-07-30 DIAGNOSIS — E119 Type 2 diabetes mellitus without complications: Secondary | ICD-10-CM | POA: Diagnosis not present

## 2014-07-30 DIAGNOSIS — H04123 Dry eye syndrome of bilateral lacrimal glands: Secondary | ICD-10-CM | POA: Diagnosis not present

## 2014-09-03 DIAGNOSIS — I2510 Atherosclerotic heart disease of native coronary artery without angina pectoris: Secondary | ICD-10-CM | POA: Diagnosis not present

## 2014-09-03 DIAGNOSIS — E119 Type 2 diabetes mellitus without complications: Secondary | ICD-10-CM | POA: Diagnosis not present

## 2014-09-03 DIAGNOSIS — M259 Joint disorder, unspecified: Secondary | ICD-10-CM | POA: Diagnosis not present

## 2014-09-03 DIAGNOSIS — E780 Pure hypercholesterolemia: Secondary | ICD-10-CM | POA: Diagnosis not present

## 2014-09-03 DIAGNOSIS — I10 Essential (primary) hypertension: Secondary | ICD-10-CM | POA: Diagnosis not present

## 2014-11-13 DIAGNOSIS — H40013 Open angle with borderline findings, low risk, bilateral: Secondary | ICD-10-CM | POA: Diagnosis not present

## 2014-12-31 DIAGNOSIS — I2510 Atherosclerotic heart disease of native coronary artery without angina pectoris: Secondary | ICD-10-CM | POA: Diagnosis not present

## 2014-12-31 DIAGNOSIS — M259 Joint disorder, unspecified: Secondary | ICD-10-CM | POA: Diagnosis not present

## 2014-12-31 DIAGNOSIS — E119 Type 2 diabetes mellitus without complications: Secondary | ICD-10-CM | POA: Diagnosis not present

## 2014-12-31 DIAGNOSIS — E780 Pure hypercholesterolemia: Secondary | ICD-10-CM | POA: Diagnosis not present

## 2014-12-31 DIAGNOSIS — I10 Essential (primary) hypertension: Secondary | ICD-10-CM | POA: Diagnosis not present

## 2015-01-01 ENCOUNTER — Ambulatory Visit (INDEPENDENT_AMBULATORY_CARE_PROVIDER_SITE_OTHER): Payer: Self-pay | Admitting: Cardiology

## 2015-01-01 ENCOUNTER — Encounter: Payer: Self-pay | Admitting: Cardiology

## 2015-01-01 VITALS — BP 148/70 | HR 85 | Ht 64.0 in | Wt 225.8 lb

## 2015-01-01 DIAGNOSIS — I2510 Atherosclerotic heart disease of native coronary artery without angina pectoris: Secondary | ICD-10-CM | POA: Diagnosis not present

## 2015-01-01 NOTE — Patient Instructions (Signed)
Your physician wants you to follow-up in: 1 Year You will receive a reminder letter in the mail two months in advance. If you don't receive a letter, please call our office to schedule the follow-up appointment.  Your physician has requested that you have a lexiscan myoview. For further information please visit [email or web address]. Please follow instruction sheet, as given.    

## 2015-01-01 NOTE — Progress Notes (Signed)
HPI The patient presents to follow up with a history of CAD.  It has been a few years since I last saw him.  He was referred back because he had a near syncopal episode when he was doing some yard work for his daughter. He said was too hot that day. He also has had dyspnea. This may be slowly progressive. He gets short of breath walking a few 100 yards on level ground. He's not describing any chest pressure, neck or arm discomfort. He's not describing any palpitations, presyncope or syncope. She had no PND or orthopnea. He doesn't do a lot of activities because of leg discomfort from back pain.  No Known Allergies  Current Outpatient Prescriptions  Medication Sig Dispense Refill  . aspirin 325 MG tablet Take 325 mg by mouth daily.      . carvedilol (COREG) 12.5 MG tablet take 1 tablet by mouth twice a day with meals 180 tablet 1  . diclofenac (VOLTAREN) 75 MG EC tablet Take 75 mg by mouth 2 (two) times daily.      . hydrochlorothiazide (MICROZIDE) 12.5 MG capsule take 1 capsule by mouth once daily 90 capsule 1  . insulin lispro protamine-insulin lispro (HUMALOG 75/25) (75-25) 100 UNIT/ML SUSP Inject 100 Units into the skin 2 (two) times daily with a meal.      . metFORMIN (GLUMETZA) 500 MG (MOD) 24 hr tablet Take 500 mg by mouth 2 (two) times daily with a meal.      . Omega-3 Fatty Acids (FISH OIL) 1000 MG CAPS Take 1 capsule by mouth 2 (two) times daily.      Marland Kitchen. omeprazole (PRILOSEC) 20 MG capsule Take 20 mg by mouth daily.      . vitamin B-12 (CYANOCOBALAMIN) 500 MCG tablet Take 500 mcg by mouth 2 (two) times daily.      . pravastatin (PRAVACHOL) 20 MG tablet Take 1 tablet (20 mg total) by mouth every evening. 90 tablet 3   No current facility-administered medications for this visit.    Past Medical History  Diagnosis Date  . Arrhythmia     NSVT  . Diabetes mellitus     type II  . Hypertension   . Hypercholesteremia   . History of tobacco use   . Coronary artery disease    status post CABG X 4  (left internal mammary artery to left anterior descending, saphenous vein graft to diagonal, saphenous  vein graft to obtuse marginal, saphenous vein graft to right coronary   artery).  . SVT (supraventricular tachycardia)   . Cardiomyopathy     EF 45%  . Bell's palsy     Past Surgical History  Procedure Laterality Date  . Cholecystectomy    . Lithotripsy    . Coronary artery bypass graft    . Cataract extraction, bilateral      ROS:  As stated in the HPI and negative for all other systems.   PHYSICAL EXAM BP 148/70 mmHg  Pulse 85  Ht 5\' 4"  (1.626 m)  Wt 225 lb 12.8 oz (102.422 kg)  BMI 38.74 kg/m2 GENERAL:  Well appearing NECK:  No jugular venous distention, waveform within normal limits, carotid upstroke brisk and symmetric, no bruits, no thyromegaly LYMPHATICS:  No cervical, inguinal adenopathy LUNGS:  Clear to auscultation bilaterally BACK:  No CVA tenderness CHEST:  Well healed sternotomy scar. HEART:  PMI not displaced or sustained,S1 and S2 within normal limits, no S3, no S4, no clicks, no rubs, no murmurs  ABD:  Flat, positive bowel sounds normal in frequency in pitch, no bruits, no rebound, no guarding, no midline pulsatile mass, no hepatomegaly, no splenomegaly, obese EXT:  2 plus pulses throughout, no edema, no cyanosis no clubbing   EKG:  Sinus rhythm, rate 85, axis within normal limits, intervals within normal limits, no acute ST-T wave changes. PVCs.  Poor anterior R wave progression.  01/01/2015   ASSESSMENT AND PLAN  Coronary artery disease -  Given his dyspnea and what he described as poorly controlled diabetes he needs screening with a treadmill test. However, with his leg and back discomfort it not be able to walk on a treadmill and therefore he will have a Lexiscan Myoview.   Cardiomyopathy - EF was 51%.  He seems to be euvolemic.  We will assess this with the study above and have a low threshold for an echocardiogram his EF seems to  be much lower than previous.   Overweight - The patient understands the need to lose weight with diet and exercise. We have discussed specific strategies for this.  We discussed this again today   Hypertension -  The blood pressure is at target. No change in medications is indicated. We will continue with therapeutic lifestyle changes (TLC).   Hypercholesteremia - I will defer to Dr. Kemper Durie with a goal LD less than 70 and HDL greater than 40.

## 2015-01-15 ENCOUNTER — Telehealth (HOSPITAL_COMMUNITY): Payer: Self-pay

## 2015-01-15 NOTE — Telephone Encounter (Signed)
Encounter complete. 

## 2015-01-17 ENCOUNTER — Ambulatory Visit (HOSPITAL_COMMUNITY)
Admission: RE | Admit: 2015-01-17 | Discharge: 2015-01-17 | Disposition: A | Discharge status: Home / Self Care | Payer: Medicare Other | Source: Ambulatory Visit | Attending: Internal Medicine | Admitting: Internal Medicine

## 2015-01-17 DIAGNOSIS — R9439 Abnormal result of other cardiovascular function study: Secondary | ICD-10-CM | POA: Insufficient documentation

## 2015-01-17 DIAGNOSIS — I517 Cardiomegaly: Secondary | ICD-10-CM | POA: Insufficient documentation

## 2015-01-17 DIAGNOSIS — I2510 Atherosclerotic heart disease of native coronary artery without angina pectoris: Secondary | ICD-10-CM

## 2015-01-17 LAB — MYOCARDIAL PERFUSION IMAGING
CHL CUP NUCLEAR SRS: 3
CHL CUP RESTING HR STRESS: 69 {beats}/min
LVDIAVOL: 118 mL
LVSYSVOL: 69 mL
Peak HR: 81 {beats}/min
SDS: 3
SSS: 6
TID: 1.25

## 2015-01-17 MED ORDER — REGADENOSON 0.4 MG/5ML IV SOLN
0.4000 mg | Freq: Once | INTRAVENOUS | Status: AC
  Administered 2015-01-17: 0.4 mg via INTRAVENOUS

## 2015-01-17 MED ORDER — TECHNETIUM TC 99M SESTAMIBI GENERIC - CARDIOLITE
10.2000 | Freq: Once | INTRAVENOUS | Status: AC | PRN
  Administered 2015-01-17: 10 via INTRAVENOUS

## 2015-01-17 MED ORDER — TECHNETIUM TC 99M SESTAMIBI GENERIC - CARDIOLITE
30.6000 | Freq: Once | INTRAVENOUS | Status: AC | PRN
  Administered 2015-01-17: 31 via INTRAVENOUS

## 2015-01-18 ENCOUNTER — Telehealth: Payer: Self-pay | Admitting: Cardiology

## 2015-01-18 NOTE — Telephone Encounter (Signed)
Closed encounter °

## 2015-03-25 ENCOUNTER — Encounter: Payer: Self-pay | Admitting: Cardiology

## 2015-03-25 ENCOUNTER — Ambulatory Visit (INDEPENDENT_AMBULATORY_CARE_PROVIDER_SITE_OTHER): Payer: Medicare Other | Admitting: Cardiology

## 2015-03-25 VITALS — BP 130/62 | HR 72 | Ht 64.0 in | Wt 228.4 lb

## 2015-03-25 DIAGNOSIS — R9439 Abnormal result of other cardiovascular function study: Secondary | ICD-10-CM

## 2015-03-25 DIAGNOSIS — I2510 Atherosclerotic heart disease of native coronary artery without angina pectoris: Secondary | ICD-10-CM

## 2015-03-25 NOTE — Progress Notes (Signed)
HPI The patient presents to follow up with a history of CAD.  When I last saw him it was to evaluate a syncopal episode when he was doing some yard work for his daughter. He said was too hot that day.  I did send him for a stress perfusion study that demonstrated an overall ejection fraction of about 42% which made this an intermediate risk study. 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  He does have chronic dyspnea as described previously. He says he has shortness of breath climbing a flight of stairs. He's not describing any palpitations, presyncope or syncope. She had no PND or orthopnea. He doesn't do a lot of activities because of leg discomfort from back pain.     No Known Allergies  Current Outpatient Prescriptions  Medication Sig Dispense Refill  . aspirin 325 MG tablet Take 325 mg by mouth daily.      . carvedilol (COREG) 12.5 MG tablet take 1 tablet by mouth twice a day with meals 180 tablet 1  . diclofenac (VOLTAREN) 75 MG EC tablet Take 75 mg by mouth 2 (two) times daily.      . hydrochlorothiazide (MICROZIDE) 12.5 MG capsule take 1 capsule by mouth once daily 90 capsule 1  . insulin lispro protamine-insulin lispro (HUMALOG 75/25) (75-25) 100 UNIT/ML SUSP Inject 100 Units into the skin 2 (two) times daily with a meal.      . metFORMIN (GLUMETZA) 500 MG (MOD) 24 hr tablet Take 500 mg by mouth 2 (two) times daily with a meal.      . Omega-3 Fatty Acids (FISH OIL) 1000 MG CAPS Take 1 capsule by mouth 2 (two) times daily.      Marland Kitchen omeprazole (PRILOSEC) 20 MG capsule Take 20 mg by mouth daily.      . vitamin B-12 (CYANOCOBALAMIN) 500 MCG tablet Take 500 mcg by mouth 2 (two) times daily.      . pravastatin (PRAVACHOL) 20 MG tablet Take 1 tablet (20 mg total) by mouth every evening. 90 tablet 3   No current facility-administered medications for this visit.    Past Medical History  Diagnosis Date  . Arrhythmia     NSVT  . Diabetes mellitus     type II  . Hypertension   .  Hypercholesteremia   . History of tobacco use   . Coronary artery disease     2007 status post CABG X 4  (left internal mammary artery to left anterior descending, saphenous vein graft to diagonal, saphenous  vein graft to obtuse marginal, saphenous vein graft to right coronary   artery).  . SVT (supraventricular tachycardia)   . Cardiomyopathy     EF 45%  . Bell's palsy     Past Surgical History  Procedure Laterality Date  . Cholecystectomy    . Lithotripsy    . Coronary artery bypass graft    . Cataract extraction, bilateral      ROS:  As stated in the HPI and negative for all other systems.   PHYSICAL EXAM BP 130/62 mmHg  Pulse 72  Ht  (1.626 m)  Wt 228 lb 6.4 oz (103.602 kg)  BMI 39.19 kg/m2 GENERAL:  Well appearing NECK:  No jugular venous distention, waveform within normal limits, carotid upstroke brisk and symmetric, no bruits, no thyromegaly LYMPHATICS:  No cervical, inguinal adenopathy LUNGS:  Clear to auscultation bilaterally BACK:  No CVA tenderness CHEST:  Well healed sternotomy scar. HEART:  PMI not displaced or sustained,S1  and S2 within normal limits, no S3, no S4, no clicks, no rubs, no murmurs ABD:  Flat, positive bowel sounds normal in frequency in pitch, no bruits, no rebound, no guarding, no midline pulsatile mass, no hepatomegaly, no splenomegaly, obese EXT:  2 plus pulses throughout, no edema, no cyanosis no clubbing   EKG:  Sinus rhythm, rate 85, axis within normal limits, intervals within normal limits, no acute ST-T wave changes. PVCs.  Poor anterior R wave progression.  03/25/2015   ASSESSMENT AND PLAN  Coronary artery disease -  he does have an intermediate risk perfusion study but no active symptoms other than some chronic dyspnea. I will check an echocardiogram. However, at this time medical management is indicated. We did discuss at risk risk reduction.  He can reduce to 81 mg ASA.  I also suggested that he talk with his primary physician  about why he is taking Voltaren twice daily as this does carry some small increased risk for acute cardiac events.    Cardiomyopathy -  He will get an echocardiogram and then med titration is indicated.   Overweight - The patient understands the need to lose weight with diet and exercise. We have discussed specific strategies for this.  We discussed this again today   Hypertension -  The blood pressure is at target. No change in medications is indicated. We will continue with therapeutic lifestyle changes (TLC).   Hypercholesteremia - I will defer to Dr. Kemper Durie with a goal LD less than 70 and HDL greater than 40.

## 2015-03-25 NOTE — Patient Instructions (Signed)
Your physician wants you to follow-up in: 1 Year. You will receive a reminder letter in the mail two months in advance. If you don't receive a letter, please call our office to schedule the follow-up appointment.  Your physician has requested that you have an echocardiogram. Echocardiography is a painless test that uses sound waves to create images of your heart. It provides your doctor with information about the size and shape of your heart and how well your heart's chambers and valves are working. This procedure takes approximately one hour. There are no restrictions for this procedure.  Your physician has recommended you make the following change in your medication: Decrease Aspirin 81 mg daily

## 2015-03-26 ENCOUNTER — Ambulatory Visit: Payer: Medicare Other | Admitting: Cardiology

## 2015-04-25 ENCOUNTER — Other Ambulatory Visit (HOSPITAL_COMMUNITY): Payer: Self-pay

## 2015-12-09 ENCOUNTER — Emergency Department (HOSPITAL_COMMUNITY): Payer: Medicare Other

## 2015-12-09 ENCOUNTER — Ambulatory Visit (HOSPITAL_COMMUNITY): Admit: 2015-12-09 | Payer: Self-pay | Admitting: Cardiovascular Disease

## 2015-12-09 ENCOUNTER — Inpatient Hospital Stay (HOSPITAL_COMMUNITY)
Admission: EM | Admit: 2015-12-09 | Discharge: 2015-12-11 | DRG: 281 | Disposition: A | Discharge status: Home / Self Care | Payer: Medicare Other | Attending: Cardiology | Admitting: Cardiology

## 2015-12-09 ENCOUNTER — Other Ambulatory Visit: Payer: Self-pay

## 2015-12-09 ENCOUNTER — Encounter (HOSPITAL_COMMUNITY)
Admission: EM | Disposition: A | Discharge status: Home / Self Care | Payer: Self-pay | Source: Home / Self Care | Attending: Cardiology

## 2015-12-09 ENCOUNTER — Encounter (HOSPITAL_COMMUNITY): Payer: Self-pay | Admitting: Emergency Medicine

## 2015-12-09 DIAGNOSIS — Z87891 Personal history of nicotine dependence: Secondary | ICD-10-CM | POA: Diagnosis not present

## 2015-12-09 DIAGNOSIS — I429 Cardiomyopathy, unspecified: Secondary | ICD-10-CM

## 2015-12-09 DIAGNOSIS — I25710 Atherosclerosis of autologous vein coronary artery bypass graft(s) with unstable angina pectoris: Secondary | ICD-10-CM | POA: Diagnosis present

## 2015-12-09 DIAGNOSIS — E7800 Pure hypercholesterolemia, unspecified: Secondary | ICD-10-CM | POA: Diagnosis present

## 2015-12-09 DIAGNOSIS — Z8249 Family history of ischemic heart disease and other diseases of the circulatory system: Secondary | ICD-10-CM | POA: Diagnosis not present

## 2015-12-09 DIAGNOSIS — Z6839 Body mass index (BMI) 39.0-39.9, adult: Secondary | ICD-10-CM

## 2015-12-09 DIAGNOSIS — R109 Unspecified abdominal pain: Secondary | ICD-10-CM | POA: Diagnosis present

## 2015-12-09 DIAGNOSIS — N179 Acute kidney failure, unspecified: Secondary | ICD-10-CM | POA: Diagnosis present

## 2015-12-09 DIAGNOSIS — I25110 Atherosclerotic heart disease of native coronary artery with unstable angina pectoris: Secondary | ICD-10-CM

## 2015-12-09 DIAGNOSIS — Z7982 Long term (current) use of aspirin: Secondary | ICD-10-CM | POA: Diagnosis not present

## 2015-12-09 DIAGNOSIS — E119 Type 2 diabetes mellitus without complications: Secondary | ICD-10-CM

## 2015-12-09 DIAGNOSIS — Z794 Long term (current) use of insulin: Secondary | ICD-10-CM | POA: Diagnosis not present

## 2015-12-09 DIAGNOSIS — I10 Essential (primary) hypertension: Secondary | ICD-10-CM | POA: Diagnosis present

## 2015-12-09 DIAGNOSIS — E1165 Type 2 diabetes mellitus with hyperglycemia: Secondary | ICD-10-CM | POA: Diagnosis present

## 2015-12-09 DIAGNOSIS — E669 Obesity, unspecified: Secondary | ICD-10-CM | POA: Diagnosis present

## 2015-12-09 DIAGNOSIS — Z833 Family history of diabetes mellitus: Secondary | ICD-10-CM

## 2015-12-09 DIAGNOSIS — I214 Non-ST elevation (NSTEMI) myocardial infarction: Secondary | ICD-10-CM | POA: Diagnosis present

## 2015-12-09 DIAGNOSIS — R079 Chest pain, unspecified: Secondary | ICD-10-CM | POA: Diagnosis not present

## 2015-12-09 DIAGNOSIS — I2510 Atherosclerotic heart disease of native coronary artery without angina pectoris: Secondary | ICD-10-CM | POA: Diagnosis present

## 2015-12-09 DIAGNOSIS — R072 Precordial pain: Secondary | ICD-10-CM | POA: Diagnosis not present

## 2015-12-09 LAB — URINALYSIS, ROUTINE W REFLEX MICROSCOPIC
BILIRUBIN URINE: NEGATIVE
Glucose, UA: >1000 mg/dL — AB
HGB URINE DIPSTICK: NEGATIVE
KETONES UR: NEGATIVE mg/dL
Leukocytes, UA: NEGATIVE
NITRITE: NEGATIVE
Protein, ur: NEGATIVE mg/dL
SPECIFIC GRAVITY, URINE: 1.01 (ref 1.005–1.030)
pH: 6 (ref 5.0–8.0)

## 2015-12-09 LAB — DIFFERENTIAL
Basophils Absolute: 0 10*3/uL (ref 0.0–0.1)
Basophils Relative: 0 %
EOS ABS: 0.4 10*3/uL (ref 0.0–0.7)
EOS PCT: 6 %
LYMPHS PCT: 42 %
Lymphs Abs: 2.3 10*3/uL (ref 0.7–4.0)
Monocytes Absolute: 0.4 10*3/uL (ref 0.1–1.0)
Monocytes Relative: 7 %
NEUTROS PCT: 45 %
Neutro Abs: 2.5 10*3/uL (ref 1.7–7.7)

## 2015-12-09 LAB — COMPREHENSIVE METABOLIC PANEL
ALT: 23 U/L (ref 17–63)
ANION GAP: 9 (ref 5–15)
AST: 31 U/L (ref 15–41)
Albumin: 3.5 g/dL (ref 3.5–5.0)
Alkaline Phosphatase: 60 U/L (ref 38–126)
BILIRUBIN TOTAL: 0.9 mg/dL (ref 0.3–1.2)
BUN: 11 mg/dL (ref 6–20)
CALCIUM: 9.4 mg/dL (ref 8.9–10.3)
CO2: 23 mmol/L (ref 22–32)
CREATININE: 1.64 mg/dL — AB (ref 0.61–1.24)
Chloride: 103 mmol/L (ref 101–111)
GFR, EST AFRICAN AMERICAN: 48 mL/min — AB (ref >60)
GFR, EST NON AFRICAN AMERICAN: 42 mL/min — AB (ref >60)
GLUCOSE: 412 mg/dL — AB (ref 65–99)
POTASSIUM: 3.4 mmol/L — AB (ref 3.5–5.1)
Sodium: 135 mmol/L (ref 135–145)
Total Protein: 7.6 g/dL (ref 6.5–8.1)

## 2015-12-09 LAB — CBC
HCT: 37.9 % — ABNORMAL LOW (ref 39.0–52.0)
Hemoglobin: 12.2 g/dL — ABNORMAL LOW (ref 13.0–17.0)
MCH: 26.5 pg (ref 26.0–34.0)
MCHC: 32.2 g/dL (ref 30.0–36.0)
MCV: 82.2 fL (ref 78.0–100.0)
PLATELETS: 327 10*3/uL (ref 150–400)
RBC: 4.61 MIL/uL (ref 4.22–5.81)
RDW: 13.8 % (ref 11.5–15.5)
WBC: 5.6 10*3/uL (ref 4.0–10.5)

## 2015-12-09 LAB — PROTIME-INR
INR: 1.09 (ref 0.00–1.49)
INR: 1.19 (ref 0.00–1.49)
PROTHROMBIN TIME: 14.3 s (ref 11.6–15.2)
Prothrombin Time: 15.3 seconds — ABNORMAL HIGH (ref 11.6–15.2)

## 2015-12-09 LAB — TROPONIN I
TROPONIN I: 0.03 ng/mL (ref <0.031)
Troponin I: 3.04 ng/mL (ref <0.031)

## 2015-12-09 LAB — LIPID PANEL
CHOL/HDL RATIO: 8.5 ratio
CHOLESTEROL: 213 mg/dL — AB (ref 0–200)
HDL: 25 mg/dL — ABNORMAL LOW (ref >40)
LDL CALC: 147 mg/dL — AB (ref 0–99)
Triglycerides: 207 mg/dL — ABNORMAL HIGH (ref <150)
VLDL: 41 mg/dL — AB (ref 0–40)

## 2015-12-09 LAB — GLUCOSE, CAPILLARY
GLUCOSE-CAPILLARY: 198 mg/dL — AB (ref 65–99)
Glucose-Capillary: 270 mg/dL — ABNORMAL HIGH (ref 65–99)

## 2015-12-09 LAB — LIPASE, BLOOD: LIPASE: 26 U/L (ref 11–51)

## 2015-12-09 LAB — MRSA PCR SCREENING: MRSA BY PCR: NEGATIVE

## 2015-12-09 LAB — URINE MICROSCOPIC-ADD ON
BACTERIA UA: NONE SEEN
RBC / HPF: NONE SEEN RBC/hpf (ref 0–5)

## 2015-12-09 LAB — APTT: aPTT: 29 seconds (ref 24–37)

## 2015-12-09 LAB — TSH: TSH: 1.117 u[IU]/mL (ref 0.350–4.500)

## 2015-12-09 LAB — MAGNESIUM: Magnesium: 2 mg/dL (ref 1.7–2.4)

## 2015-12-09 LAB — HEPARIN LEVEL (UNFRACTIONATED): Heparin Unfractionated: 0.44 IU/mL (ref 0.30–0.70)

## 2015-12-09 SURGERY — LEFT HEART CATH AND CORONARY ANGIOGRAPHY

## 2015-12-09 MED ORDER — IOPAMIDOL (ISOVUE-300) INJECTION 61%
INTRAVENOUS | Status: AC
  Administered 2015-12-09: 100 mL
  Filled 2015-12-09: qty 100

## 2015-12-09 MED ORDER — SODIUM CHLORIDE 0.9 % IV BOLUS (SEPSIS)
1000.0000 mL | Freq: Once | INTRAVENOUS | Status: AC
  Administered 2015-12-09: 1000 mL via INTRAVENOUS

## 2015-12-09 MED ORDER — SODIUM CHLORIDE 0.9 % IV SOLN: 250.0000 mL | INTRAVENOUS | Status: DC | PRN

## 2015-12-09 MED ORDER — ASPIRIN 81 MG PO CHEW
81.0000 mg | CHEWABLE_TABLET | ORAL | Status: AC
  Administered 2015-12-10: 81 mg via ORAL
  Filled 2015-12-09: qty 1

## 2015-12-09 MED ORDER — ACETAMINOPHEN 325 MG PO TABS
650.0000 mg | ORAL_TABLET | ORAL | Status: DC | PRN
  Administered 2015-12-11: 650 mg via ORAL
  Filled 2015-12-09: qty 2

## 2015-12-09 MED ORDER — CARVEDILOL 12.5 MG PO TABS
12.5000 mg | ORAL_TABLET | Freq: Two times a day (BID) | ORAL | Status: DC
  Administered 2015-12-09 – 2015-12-11 (×4): 12.5 mg via ORAL
  Filled 2015-12-09 (×4): qty 1

## 2015-12-09 MED ORDER — ASPIRIN 300 MG RE SUPP: 300.0000 mg | RECTAL | Status: DC

## 2015-12-09 MED ORDER — SODIUM CHLORIDE 0.9 % WEIGHT BASED INFUSION: 1.0000 mL/kg/h | INTRAVENOUS | Status: DC

## 2015-12-09 MED ORDER — HEPARIN SODIUM (PORCINE) 5000 UNIT/ML IJ SOLN
INTRAMUSCULAR | Status: AC
  Filled 2015-12-09: qty 1

## 2015-12-09 MED ORDER — NITROGLYCERIN 0.4 MG SL SUBL: 0.4000 mg | SUBLINGUAL_TABLET | SUBLINGUAL | Status: DC | PRN

## 2015-12-09 MED ORDER — INSULIN ASPART 100 UNIT/ML ~~LOC~~ SOLN: 0.0000 [IU] | Freq: Every day | SUBCUTANEOUS | Status: DC

## 2015-12-09 MED ORDER — HEPARIN SODIUM (PORCINE) 5000 UNIT/ML IJ SOLN
4000.0000 [IU] | INTRAMUSCULAR | Status: AC
  Administered 2015-12-09: 4000 [IU] via INTRAVENOUS

## 2015-12-09 MED ORDER — AMLODIPINE BESYLATE 10 MG PO TABS
10.0000 mg | ORAL_TABLET | Freq: Every day | ORAL | Status: DC
  Administered 2015-12-09 – 2015-12-11 (×3): 10 mg via ORAL
  Filled 2015-12-09 (×3): qty 1

## 2015-12-09 MED ORDER — ATORVASTATIN CALCIUM 80 MG PO TABS
80.0000 mg | ORAL_TABLET | Freq: Every day | ORAL | Status: DC
  Administered 2015-12-09: 80 mg via ORAL
  Filled 2015-12-09: qty 1

## 2015-12-09 MED ORDER — MORPHINE SULFATE (PF) 4 MG/ML IV SOLN
4.0000 mg | Freq: Once | INTRAVENOUS | Status: AC
  Administered 2015-12-09: 4 mg via INTRAVENOUS
  Filled 2015-12-09: qty 1

## 2015-12-09 MED ORDER — ALPRAZOLAM 0.25 MG PO TABS: 0.2500 mg | ORAL_TABLET | Freq: Two times a day (BID) | ORAL | Status: DC | PRN

## 2015-12-09 MED ORDER — PANTOPRAZOLE SODIUM 40 MG PO TBEC
40.0000 mg | DELAYED_RELEASE_TABLET | Freq: Every day | ORAL | Status: DC
  Administered 2015-12-09 – 2015-12-11 (×3): 40 mg via ORAL
  Filled 2015-12-09 (×3): qty 1

## 2015-12-09 MED ORDER — ONDANSETRON HCL 4 MG/2ML IJ SOLN: 4.0000 mg | Freq: Four times a day (QID) | INTRAMUSCULAR | Status: DC | PRN

## 2015-12-09 MED ORDER — SODIUM CHLORIDE 0.9% FLUSH: 3.0000 mL | INTRAVENOUS | Status: DC | PRN

## 2015-12-09 MED ORDER — INSULIN ASPART PROT & ASPART (70-30 MIX) 100 UNIT/ML ~~LOC~~ SUSP
10.0000 [IU] | Freq: Two times a day (BID) | SUBCUTANEOUS | Status: DC
  Administered 2015-12-09: 10 [IU] via SUBCUTANEOUS
  Filled 2015-12-09: qty 10

## 2015-12-09 MED ORDER — SODIUM CHLORIDE 0.9 % IV SOLN: 10.0000 mL/h | INTRAVENOUS | Status: DC

## 2015-12-09 MED ORDER — INSULIN ASPART 100 UNIT/ML ~~LOC~~ SOLN
0.0000 [IU] | Freq: Three times a day (TID) | SUBCUTANEOUS | Status: DC
  Administered 2015-12-09: 5 [IU] via SUBCUTANEOUS
  Administered 2015-12-10: 7 [IU] via SUBCUTANEOUS
  Administered 2015-12-10: 10 [IU] via SUBCUTANEOUS
  Administered 2015-12-11: 2 [IU] via SUBCUTANEOUS
  Administered 2015-12-11: 5 [IU] via SUBCUTANEOUS

## 2015-12-09 MED ORDER — ZOLPIDEM TARTRATE 5 MG PO TABS: 5.0000 mg | ORAL_TABLET | Freq: Every evening | ORAL | Status: DC | PRN

## 2015-12-09 MED ORDER — ASPIRIN 81 MG PO CHEW: 324.0000 mg | CHEWABLE_TABLET | Freq: Once | ORAL | Status: DC

## 2015-12-09 MED ORDER — POTASSIUM CHLORIDE CRYS ER 20 MEQ PO TBCR
20.0000 meq | EXTENDED_RELEASE_TABLET | Freq: Once | ORAL | Status: AC
  Administered 2015-12-09: 20 meq via ORAL
  Filled 2015-12-09: qty 1

## 2015-12-09 MED ORDER — SODIUM CHLORIDE 0.9% FLUSH: 3.0000 mL | Freq: Two times a day (BID) | INTRAVENOUS | Status: DC

## 2015-12-09 MED ORDER — SODIUM CHLORIDE 0.9 % WEIGHT BASED INFUSION: 3.0000 mL/kg/h | INTRAVENOUS | Status: DC

## 2015-12-09 MED ORDER — ASPIRIN EC 81 MG PO TBEC: 81.0000 mg | DELAYED_RELEASE_TABLET | Freq: Every day | ORAL | Status: DC

## 2015-12-09 MED ORDER — ONDANSETRON HCL 4 MG/2ML IJ SOLN
4.0000 mg | Freq: Once | INTRAMUSCULAR | Status: AC
  Administered 2015-12-09: 4 mg via INTRAVENOUS
  Filled 2015-12-09: qty 2

## 2015-12-09 MED ORDER — SODIUM CHLORIDE 0.9 % IV SOLN
INTRAVENOUS | Status: DC
  Administered 2015-12-10: 50 mL/h via INTRAVENOUS

## 2015-12-09 MED ORDER — HEPARIN (PORCINE) IN NACL 100-0.45 UNIT/ML-% IJ SOLN
1000.0000 [IU]/h | INTRAMUSCULAR | Status: DC
  Administered 2015-12-09: 1000 [IU]/h via INTRAVENOUS
  Filled 2015-12-09: qty 250

## 2015-12-09 MED ORDER — ASPIRIN 81 MG PO CHEW: 324.0000 mg | CHEWABLE_TABLET | ORAL | Status: DC

## 2015-12-09 MED ORDER — NITROGLYCERIN IN D5W 200-5 MCG/ML-% IV SOLN
3.0000 ug/min | INTRAVENOUS | Status: DC
  Administered 2015-12-09: 5 ug/min via INTRAVENOUS
  Filled 2015-12-09: qty 250

## 2015-12-09 NOTE — ED Notes (Signed)
Cardiology at bedside.

## 2015-12-09 NOTE — H&P (Signed)
Mark Perla, MD Physician Signed Cardiology Consult Note 12/09/2015 10:36 AM    Expand All Collapse All           Reason for Consult: chest and abd pain and troponin 3.04   Referring Physician: Dr. Carlis Abbott   PCP: Foye Spurling, MD  Primary Cardiologist:Dr. Margarette Asal is an 65 y.o. male.   Chief Complaint: Mark Fernandez to PCP with chest pain.   HPI: 60 yroM with hx of CAD-2007 status post CABG X 4 (left internal mammary artery to left anterior descending, saphenous vein graft to diagonal, saphenous vein graft to obtuse marginal, saphenous vein graft to right coronary artery) EF 45%, SVT HDL, DM-2, HTN. Last nuc 12/2014 with EF 42%, sm. Defect of mild severity anteroseptal and anteroapical. Last cath prior to CABG.  Today pt went to PCP for routine visit but pain developed pain in chest (dull pressure) and on arrival to Dr. Ainsley Spinner office and was diaphoretic, SOB and nauseated. 2 NTG helped chest pain. EKG with ST ? Changes in ant leads but it was similar to previous EKG. He also has abd pain. His troponin is 3.04.   Currently no chest pain but + abd pain, similar dull pressure. abd tender to palpation with + BS. He is for CT of ABD. He had normal BM yesterday and no constipation. He had stopped his atorvastatin a week ago due to muscle cramps.   EKG with SR ST depression inf lat leads. K+ 3.4 Cr 1.64, troponin 3.04 T chol 213, TG 207, HDL 25, LDL 147,  CXR with Ill-defined reticular nodular opacities bilaterally Waiting CT of abd.   Past Medical History  Diagnosis Date  . Arrhythmia     NSVT  . Diabetes mellitus     type II  . Hypertension   . Hypercholesteremia   . History of tobacco use   . Coronary artery disease     2007 status post CABG X 4 (left internal mammary artery to left anterior descending, saphenous vein graft to diagonal, saphenous vein graft to obtuse marginal, saphenous vein graft to right  coronary artery).  . SVT (supraventricular tachycardia) ([place])   . Cardiomyopathy     EF 45%  . Bell's palsy     Past Surgical History  Procedure Laterality Date  . Cholecystectomy    . Lithotripsy    . Coronary artery bypass graft    . Cataract extraction, bilateral      Family History  Problem Relation Age of Onset  . Hypertension Mother   . Diabetes Mother    Social History:  reports that he quit smoking about 12 years ago. He does not have any smokeless tobacco history on file. He reports that he drinks alcohol. He reports that he does not use illicit drugs.  Allergies: No Known Allergies  OUTPATIENT MEDICATIONS: Meds: systane 1 drop both eyes as needed, was on atorvastatin 80 mg, + insulin, centrum silver, KDur 20 meq daily, HCTZ 25 mg daily, omeprazole 20 mg daily, zantac prn, ASA 81 mg daily, amlodipine 10 mg daily, coreg 25 mg BID,     Lab Results Last 48 Hours    Results for orders placed or performed during the hospital encounter of 12/09/15 (from the past 48 hour(s))  Lipase, blood Status: None   Collection Time: 12/09/15 9:40 AM  Result Value Ref Range   Lipase 26 11 - 51 U/L  CBC Status: Abnormal   Collection Time: 12/09/15 9:44 AM  Result Value Ref  Range   WBC 5.6 4.0 - 10.5 K/uL   RBC 4.61 4.22 - 5.81 MIL/uL   Hemoglobin 12.2 (L) 13.0 - 17.0 g/dL   HCT 37.9 (L) 39.0 - 52.0 %   MCV 82.2 78.0 - 100.0 fL   MCH 26.5 26.0 - 34.0 pg   MCHC 32.2 30.0 - 36.0 g/dL   RDW 13.8 11.5 - 15.5 %   Platelets 327 150 - 400 K/uL  Differential Status: None   Collection Time: 12/09/15 9:44 AM  Result Value Ref Range   Neutrophils Relative % 45 %   Neutro Abs 2.5 1.7 - 7.7 K/uL   Lymphocytes Relative 42 %   Lymphs Abs 2.3 0.7 - 4.0 K/uL   Monocytes Relative 7 %   Monocytes Absolute 0.4 0.1 - 1.0 K/uL   Eosinophils  Relative 6 %   Eosinophils Absolute 0.4 0.0 - 0.7 K/uL   Basophils Relative 0 %   Basophils Absolute 0.0 0.0 - 0.1 K/uL  Protime-INR Status: None   Collection Time: 12/09/15 9:44 AM  Result Value Ref Range   Prothrombin Time 14.3 11.6 - 15.2 seconds   INR 1.09 0.00 - 1.49  APTT Status: None   Collection Time: 12/09/15 9:44 AM  Result Value Ref Range   aPTT 29 24 - 37 seconds  Comprehensive metabolic panel Status: Abnormal   Collection Time: 12/09/15 9:44 AM  Result Value Ref Range   Sodium 135 135 - 145 mmol/L   Potassium 3.4 (L) 3.5 - 5.1 mmol/L   Chloride 103 101 - 111 mmol/L   CO2 23 22 - 32 mmol/L   Glucose, Bld 412 (H) 65 - 99 mg/dL   BUN 11 6 - 20 mg/dL   Creatinine, Ser 1.64 (H) 0.61 - 1.24 mg/dL   Calcium 9.4 8.9 - 10.3 mg/dL   Total Protein 7.6 6.5 - 8.1 g/dL   Albumin 3.5 3.5 - 5.0 g/dL   AST 31 15 - 41 U/L   ALT 23 17 - 63 U/L   Alkaline Phosphatase 60 38 - 126 U/L   Total Bilirubin 0.9 0.3 - 1.2 mg/dL   GFR calc non Af Amer 42 (L) >60 mL/min   GFR calc Af Amer 48 (L) >60 mL/min    Comment: (NOTE) The eGFR has been calculated using the CKD EPI equation. This calculation has not been validated in all clinical situations. eGFR's persistently <60 mL/min signify possible Chronic Kidney Disease.    Anion gap 9 5 - 15  Troponin I Status: Abnormal   Collection Time: 12/09/15 9:44 AM  Result Value Ref Range   Troponin I 3.04 (HH) <0.031 ng/mL    Comment:   POSSIBLE MYOCARDIAL ISCHEMIA. SERIAL TESTING RECOMMENDED. CRITICAL RESULT CALLED TO, READ BACK BY AND VERIFIED WITH: JESSICA BRANCH,RN AT 9163 12/09/15 BY ZBEECH.   Lipid panel Status: Abnormal   Collection Time: 12/09/15 9:49 AM  Result Value Ref Range   Cholesterol 213 (H) 0 - 200 mg/dL   Triglycerides 207 (H) <150 mg/dL   HDL 25  (L) >40 mg/dL   Total CHOL/HDL Ratio 8.5 RATIO   VLDL 41 (H) 0 - 40 mg/dL   LDL Cholesterol 147 (H) 0 - 99 mg/dL    Comment:   Total Cholesterol/HDL:CHD Risk Coronary Heart Disease Risk Table  Men Women 1/2 Average Risk 3.4 3.3 Average Risk 5.0 4.4 2 X Average Risk 9.6 7.1 3 X Average Risk 23.4 11.0   Use the calculated Patient Ratio above and the CHD Risk Table to  determine the patient's CHD Risk.   ATP III CLASSIFICATION (LDL): <100 mg/dL Optimal 100-129 mg/dL Near or Above  Optimal 130-159 mg/dL Borderline 160-189 mg/dL High >190 mg/dL Very High       Imaging Results (Last 48 hours)    Dg Chest Port 1 View  12/09/2015 CLINICAL DATA: 65 year old male with a history of chest pain EXAM: PORTABLE CHEST 1 VIEW COMPARISON: 11/21/2007 FINDINGS: Cardiomediastinal silhouette unchanged. Surgical changes of median sternotomy and CABG. Ill-defined reticulonodular opacity bilateral lungs. No pneumothorax. No large confluent airspace disease. No large pleural effusion. No displaced fracture. IMPRESSION: Ill-defined reticular nodular opacities bilaterally. This is nonspecific, potentially secondary to atelectasis given the lung volumes, however, acute pneumonitis for atypical infection could have this appearance. Surgical changes of median sternotomy and CABG. Signed, Dulcy Fanny. Earleen Newport, DO [facility] Radiology Electronically Signed By: Corrie Mckusick D.O. On: 12/09/2015 10:02     ROS: General:no colds or fevers, no weight changes Skin:no rashes or ulcers HEENT:no blurred vision, no congestion CV:see HPI PUL:see HPI GI:no diarrhea constipation or melena, no indigestion GU:no hematuria, no dysuria MS:no joint pain, no claudication Neuro:no syncope, no lightheadedness Endo:+ diabetes, no thyroid disease  Blood  pressure 174/80, pulse 59, SpO2 100 %.  Wt Readings from Last 3 Encounters:  03/25/15 228 lb 6.4 oz (103.602 kg)  01/17/15 225 lb (102.059 kg)  01/01/15 225 lb 12.8 oz (102.422 kg)    PE: General:Pleasant affect, obviously does not fel well. Skin:Warm and dry, brisk capillary refill HEENT:normocephalic, sclera clear, mucus membranes moist Neck:supple, no JVD, no bruits  Heart:S1S2 RRR without murmur, gallup, rub or click, 2+ pedal bil, 2+ radial bil  Lungs:clear without rales, rhonchi, or wheezes AOZ:HYQMV, soft, + diffuse tenderness, + BS, do not palpate liver spleen or masses Ext:no lower ext edema, 2+ pedal pulses, 2+ radial pulses Neuro:alert and oriented X 3, MAE, follows commands, + facial symmetry  SB   Assessment/Plan Principal Problem:  Chest pain, with elevated troponin, now chest pain improved, if abd CT stable will add IV heparin and proceed with cardiac cath today or tomorrow depending on results and stablility. If give fluids overnight to improve kidney function would be optimal with CT of abd as well today.  ---agree with admit and serial troponins. Resume statin.  --heparin if CT abd stable.  Active Problems:  Abdominal pain- for abd CT  Hypertension- controlled  Coronary artery disease hx CABG 2007 last nuc 12/2014 was stable.   Cardiomyopathy ([place]) hx EF 42%  Hypercholesteremia- held his atorvastatin for last week due to muscle cramping, but has not changed symptoms ,  CKD- new elevation- IV fluids. monitor DM-2 on insulin with hyperglycemia   Dr. Stanford Breed to see.  Cecilie Kicks Nurse Practitioner Certified [place] Pager [phone] or after 5pm or weekends call [phone] 12/09/2015, 11:43 AM  As above, patient seen and examined. Briefly he is a 65 year old male with past medical history of coronary artery disease s/p CABG, diabetes mellitus, hypertension, hyperlipidemia with non-ST elevation myocardial  infarction. Patient states he has had worsening exertional angina recently. This morning he developed chest and abdominal pain. It was described as a tightness without radiation. Similar to previous infarct. Associated nausea, diaphoresis and dyspnea. Chest pain resolved in ER with nitroglycerin. Abdominal pain persists but improved. Note he was hospitalized at the [facility] for abdominal pain similar to this 6 months ago with results unavailable. He has had mild intermittent abdominal pain since. Creatinine is minimally elevated at  1.64. Troponin 3.04. Electrocardiogram shows sinus rhythm, septal infarct and nonspecific ST changes. 1 Non-ST elevation myocardial infarction-Patient has had progressive angina and has now ruled in. Plan aspirin, heparin and continue beta blocker. Add Lipitor 80 mg daily. Patient will require cardiac catheterization. However his creatinine is newly elevated. He also received contrast for abdominal CT in the emergency room. Hold hydrochlorothiazide. Hydrate. Recheck renal function tomorrow morning. If stable proceed with catheterization without ventriculogram in AM. 2 abdominal pain-etiology unclear. CT scan unremarkable. Liver functions normal. He apparently had a similar episode 6 months ago and has had mild pain intermittent pain since. Will follow. Further evaluation for recurrent symptoms. 3 Acute kidney disease-creatinine minimally elevated. Hydrate and follow renal function 4 Diabetes mellitus-continue preadmission medications but hold Glucophage for 48 hours following catheterization. 5 hypertension-continue preadmission medications. Kirk Ruths

## 2015-12-09 NOTE — Consult Note (Signed)
Reason for Consult: chest and abd pain and troponin 3.04   Referring Physician: Dr. Carlis Fernandez   PCP:  Mark Spurling, MD  Primary Cardiologist:Dr. Margarette Fernandez is an 65 y.o. male.    Chief Complaint:  Mark Fernandez to PCP with chest pain.    HPI: [name] with hx of CAD-2007 status post CABG X 4 (left internal mammary artery to left anterior descending, saphenous vein graft to diagonal, saphenous vein graft to obtuse marginal, saphenous vein graft to right coronary artery) EF 45%, SVT HDL, DM-2, HTN.  Last nuc 12/2014 with EF 42%, sm. Defect of mild severity anteroseptal and anteroapical.  Last cath prior to CABG.  Today pt went to PCP for routine visit but pain developed pain in chest (dull pressure) and on arrival to Dr. Ainsley Fernandez office and was diaphoretic, SOB and nauseated.  2 NTG helped chest pain. EKG with ST ? Changes in ant leads but it was similar to previous EKG. He also has abd pain.  His troponin is 3.04.    Currently no chest pain but + abd pain, similar dull pressure.  abd tender to palpation with + BS.  He is for CT of ABD.  He had normal BM yesterday and no constipation.  He had stopped his atorvastatin a week ago due to muscle cramps.      EKG with SR ST depression inf lat leads.  K+ 3.4 Cr 1.64, troponin 3.04  T chol 213, TG 207, HDL 25, LDL 147,  CXR with  Ill-defined reticular nodular opacities bilaterally Waiting CT of abd.   Past Medical History  Diagnosis Date  . Arrhythmia     NSVT  . Diabetes mellitus     type II  . Hypertension   . Hypercholesteremia   . History of tobacco use   . Coronary artery disease     2007 status post CABG X 4  (left internal mammary artery to left anterior descending, saphenous vein graft to diagonal, saphenous  vein graft to obtuse marginal, saphenous vein graft to right coronary   artery).  . SVT (supraventricular tachycardia) ([place] Fernandez)   . Cardiomyopathy     EF 45%  . Bell's palsy     Past Surgical History    Procedure Laterality Date  . Cholecystectomy    . Lithotripsy    . Coronary artery bypass graft    . Cataract extraction, bilateral      Family History  Problem Relation Age of Onset  . Hypertension Mother   . Diabetes Mother    Social History:  reports that he quit smoking about 12 years ago. He does not have any smokeless tobacco history on file. He reports that he drinks alcohol. He reports that he does not use illicit drugs.  Allergies: No Known Allergies  OUTPATIENT MEDICATIONS: Meds:  systane 1 drop both eyes as needed, was on atorvastatin 80 mg, + insulin, centrum silver, KDur 20 meq daily, HCTZ 25 mg daily, omeprazole 20 mg daily, zantac prn, ASA 81 mg daily, amlodipine 10 mg daily, coreg 25 mg BID,    Results for orders placed or performed during the hospital encounter of 12/09/15 (from the past 48 hour(s))  Lipase, blood     Status: None   Collection Time: 12/09/15  9:40 AM  Result Value Ref Range   Lipase 26 11 - 51 U/L  CBC     Status: Abnormal   Collection Time: 12/09/15  9:44 AM  Result Value Ref Range   WBC 5.6 4.0 - 10.5 K/uL   RBC 4.61 4.22 - 5.81 MIL/uL   Hemoglobin 12.2 (L) 13.0 - 17.0 g/dL   HCT 01.8 (L) 99.2 - 12.3 %   MCV 82.2 78.0 - 100.0 fL   MCH 26.5 26.0 - 34.0 pg   MCHC 32.2 30.0 - 36.0 g/dL   RDW 56.2 68.5 - 39.5 %   Platelets 327 150 - 400 K/uL  Differential     Status: None   Collection Time: 12/09/15  9:44 AM  Result Value Ref Range   Neutrophils Relative % 45 %   Neutro Abs 2.5 1.7 - 7.7 K/uL   Lymphocytes Relative 42 %   Lymphs Abs 2.3 0.7 - 4.0 K/uL   Monocytes Relative 7 %   Monocytes Absolute 0.4 0.1 - 1.0 K/uL   Eosinophils Relative 6 %   Eosinophils Absolute 0.4 0.0 - 0.7 K/uL   Basophils Relative 0 %   Basophils Absolute 0.0 0.0 - 0.1 K/uL  Protime-INR     Status: None   Collection Time: 12/09/15  9:44 AM  Result Value Ref Range   Prothrombin Time 14.3 11.6 - 15.2 seconds   INR 1.09 0.00 - 1.49  APTT     Status: None    Collection Time: 12/09/15  9:44 AM  Result Value Ref Range   aPTT 29 24 - 37 seconds  Comprehensive metabolic panel     Status: Abnormal   Collection Time: 12/09/15  9:44 AM  Result Value Ref Range   Sodium 135 135 - 145 mmol/L   Potassium 3.4 (L) 3.5 - 5.1 mmol/L   Chloride 103 101 - 111 mmol/L   CO2 23 22 - 32 mmol/L   Glucose, Bld 412 (H) 65 - 99 mg/dL   BUN 11 6 - 20 mg/dL   Creatinine, Ser 9.20 (H) 0.61 - 1.24 mg/dL   Calcium 9.4 8.9 - 64.6 mg/dL   Total Protein 7.6 6.5 - 8.1 g/dL   Albumin 3.5 3.5 - 5.0 g/dL   AST 31 15 - 41 U/L   ALT 23 17 - 63 U/L   Alkaline Phosphatase 60 38 - 126 U/L   Total Bilirubin 0.9 0.3 - 1.2 mg/dL   GFR calc non Af Amer 42 (L) >60 mL/min   GFR calc Af Amer 48 (L) >60 mL/min    Comment: (NOTE) The eGFR has been calculated using the CKD EPI equation. This calculation has not been validated in all clinical situations. eGFR's persistently <60 mL/min signify possible Chronic Kidney Disease.    Anion gap 9 5 - 15  Troponin I     Status: Abnormal   Collection Time: 12/09/15  9:44 AM  Result Value Ref Range   Troponin I 3.04 (HH) <0.031 ng/mL    Comment:        POSSIBLE MYOCARDIAL ISCHEMIA. SERIAL TESTING RECOMMENDED. CRITICAL RESULT CALLED TO, READ BACK BY AND VERIFIED WITH: JESSICA BRANCH,RN AT 1034 12/09/15 BY ZBEECH.   Lipid panel     Status: Abnormal   Collection Time: 12/09/15  9:49 AM  Result Value Ref Range   Cholesterol 213 (H) 0 - 200 mg/dL   Triglycerides 318 (H) <150 mg/dL   HDL 25 (L) >08 mg/dL   Total CHOL/HDL Ratio 8.5 RATIO   VLDL 41 (H) 0 - 40 mg/dL   LDL Cholesterol 138 (H) 0 - 99 mg/dL    Comment:        Total Cholesterol/HDL:CHD Risk Coronary  Heart Disease Risk Table                     Men   Women  1/2 Average Risk   3.4   3.3  Average Risk       5.0   4.4  2 X Average Risk   9.6   7.1  3 X Average Risk  23.4   11.0        Use the calculated Patient Ratio above and the CHD Risk Table to determine the patient's  CHD Risk.        ATP III CLASSIFICATION (LDL):  <100     mg/dL   Optimal  100-129  mg/dL   Near or Above                    Optimal  130-159  mg/dL   Borderline  160-189  mg/dL   High  >190     mg/dL   Very High    Dg Chest Port 1 View  12/09/2015  CLINICAL DATA:  65 year old male with a history of chest pain EXAM: PORTABLE CHEST 1 VIEW COMPARISON:  11/21/2007 FINDINGS: Cardiomediastinal silhouette unchanged. Surgical changes of median sternotomy and CABG. Ill-defined reticulonodular opacity bilateral lungs. No pneumothorax. No large confluent airspace disease. No large pleural effusion. No displaced fracture. IMPRESSION: Ill-defined reticular nodular opacities bilaterally. This is nonspecific, potentially secondary to atelectasis given the lung volumes, however, acute pneumonitis for atypical infection could have this appearance. Surgical changes of median sternotomy and CABG. Signed, Dulcy Fanny. Earleen Newport, DO [facility] Radiology Electronically Signed   By: Corrie Mckusick D.O.   On: 12/09/2015 10:02    ROS: General:no colds or fevers, no weight changes Skin:no rashes or ulcers HEENT:no blurred vision, no congestion CV:see HPI PUL:see HPI GI:no diarrhea constipation or melena, no indigestion GU:no hematuria, no dysuria MS:no joint pain, no claudication Neuro:no syncope, no lightheadedness Endo:+ diabetes, no thyroid disease   Blood pressure 174/80, pulse 59, SpO2 100 %.  Wt Readings from Last 3 Encounters:  03/25/15 228 lb 6.4 oz (103.602 kg)  01/17/15 225 lb (102.059 kg)  01/01/15 225 lb 12.8 oz (102.422 kg)    PE: General:Pleasant affect, obviously does not fel well. Skin:Warm and dry, brisk capillary refill HEENT:normocephalic, sclera clear, mucus membranes moist Neck:supple, no JVD, no bruits  Heart:S1S2 RRR without murmur, gallup, rub or click, 2+ pedal bil, 2+ radial bil  Lungs:clear without rales, rhonchi, or  wheezes GGE:ZMOQH, soft, + diffuse tenderness, + BS, do not palpate liver spleen or masses Ext:no lower ext edema, 2+ pedal pulses, 2+ radial pulses Neuro:alert and oriented X 3, MAE, follows commands, + facial symmetry   SB   Assessment/Plan Principal Problem:   Chest pain, with elevated troponin, now chest pain improved, if abd CT stable will add IV heparin and proceed with cardiac cath today or tomorrow depending on results and stablility.  If give fluids overnight to improve kidney function would be optimal with CT of abd as well today.  ---agree with admit and serial troponins.  Resume statin.  --heparin if CT abd stable.  Active Problems:   Abdominal pain- for abd CT   Hypertension- controlled   Coronary artery disease hx CABG 2007 last nuc 12/2014 was stable.    Cardiomyopathy ([place]) hx EF 42%   Hypercholesteremia- held his atorvastatin for last week due to muscle cramping, but has not changed symptoms ,  CKD- new elevation- IV  fluids.  monitor DM-2 on insulin with hyperglycemia   Dr. Stanford Breed to see.  Cecilie Kicks  Nurse Practitioner Certified [place] Pager [phone] or after 5pm or weekends call [phone] 12/09/2015, 11:43 AM  As above, patient seen and examined. Briefly he is a 65 year old male with past medical history of coronary artery disease s/p CABG, diabetes mellitus, hypertension, hyperlipidemia with non-ST elevation myocardial infarction. Patient states he has had worsening exertional angina recently. This morning he developed chest and abdominal pain. It was described as a tightness without radiation. Similar to previous infarct. Associated nausea, diaphoresis and dyspnea. Chest pain resolved in ER with nitroglycerin. Abdominal pain persists but improved.  Note he was hospitalized at the [facility] for abdominal pain similar to this 6 months ago with results unavailable. He has had mild intermittent abdominal pain since. Creatinine is minimally  elevated at 1.64. Troponin 3.04. Electrocardiogram shows sinus rhythm, septal infarct and nonspecific ST changes. 1 Non-ST elevation myocardial infarction-Patient has had progressive angina and has now ruled in. Plan aspirin, heparin and continue beta blocker. Add Lipitor 80 mg daily. Patient will require cardiac catheterization. However his creatinine is newly elevated.  He also received contrast for abdominal CT in the emergency room. Hold hydrochlorothiazide. Hydrate. Recheck renal function tomorrow morning.  If stable proceed with catheterization without ventriculogram in AM. 2 abdominal pain-etiology unclear. CT scan unremarkable. Liver functions normal. He apparently had a similar episode 6 months ago and has had mild pain intermittent pain since. Will follow. Further evaluation for recurrent symptoms. 3 Acute kidney disease-creatinine minimally elevated. Hydrate and follow renal function 4  Diabetes mellitus-continue preadmission medications but hold Glucophage for 48 hours following catheterization. 5 hypertension-continue preadmission medications. Kirk Ruths

## 2015-12-09 NOTE — H&P (Signed)
Expand All Collapse All           Reason for Consult: chest and abd pain and troponin 3.04   Referring Physician: Dr. Chestine Fernandez   PCP: Mark Slimmer, Mark Fernandez  Primary Cardiologist:Mark Fernandez is an 65 y.o. male.   Chief Complaint: Mark Fernandez to PCP with chest pain.   HPI: 35 yroM with hx of CAD-2007 status post CABG X 4 (left internal mammary artery to left anterior descending, saphenous vein graft to diagonal, saphenous vein graft to obtuse marginal, saphenous vein graft to right coronary artery) EF 45%, SVT HDL, DM-2, HTN. Last nuc 12/2014 with EF 42%, sm. Defect of mild severity anteroseptal and anteroapical. Last cath prior to CABG.  Today pt went to PCP for routine visit but pain developed pain in chest (dull pressure) and on arrival to Mark Fernandez office and was diaphoretic, SOB and nauseated. 2 NTG helped chest pain. EKG with ST ? Changes in ant leads but it was similar to previous EKG. He also has abd pain. His troponin is 3.04.   Currently no chest pain but + abd pain, similar dull pressure. abd tender to palpation with + BS. He is for CT of ABD. He had normal BM yesterday and no constipation. He had stopped his atorvastatin a week ago due to muscle cramps.   EKG with SR ST depression inf lat leads. K+ 3.4 Cr 1.64, troponin 3.04 T chol 213, TG 207, HDL 25, LDL 147,  CXR with Ill-defined reticular nodular opacities bilaterally Waiting CT of abd.   Past Medical History  Diagnosis Date  . Arrhythmia     NSVT  . Diabetes mellitus     type II  . Hypertension   . Hypercholesteremia   . History of tobacco use   . Coronary artery disease     2007 status post CABG X 4 (left internal mammary artery to left anterior descending, saphenous vein graft to diagonal, saphenous vein graft to obtuse marginal, saphenous vein graft to right coronary artery).  . SVT (supraventricular tachycardia) (HCC)   . Cardiomyopathy      EF 45%  . Bell's palsy     Past Surgical History  Procedure Laterality Date  . Cholecystectomy    . Lithotripsy    . Coronary artery bypass graft    . Cataract extraction, bilateral      Family History  Problem Relation Age of Onset  . Hypertension Mother   . Diabetes Mother    Social History:  reports that he quit smoking about 12 years ago. He does not have any smokeless tobacco history on file. He reports that he drinks alcohol. He reports that he does not use illicit drugs.  Allergies: No Known Allergies  OUTPATIENT MEDICATIONS: Meds: systane 1 drop both eyes as needed, was on atorvastatin 80 mg, + insulin, centrum silver, KDur 20 meq daily, HCTZ 25 mg daily, omeprazole 20 mg daily, zantac prn, ASA 81 mg daily, amlodipine 10 mg daily, coreg 25 mg BID,     Lab Results Last 48 Hours    Results for orders placed or performed during the hospital encounter of 12/09/15 (from the past 48 hour(s))  Lipase, blood Status: None   Collection Time: 12/09/15 9:40 AM  Result Value Ref Range   Lipase 26 11 - 51 U/L  CBC Status: Abnormal   Collection Time: 12/09/15 9:44 AM  Result Value Ref Range   WBC 5.6 4.0 - 10.5 K/uL   RBC 4.61 4.22 -  5.81 MIL/uL   Hemoglobin 12.2 (L) 13.0 - 17.0 g/dL   HCT 68.3 (L) 87.0 - 65.8 %   MCV 82.2 78.0 - 100.0 fL   MCH 26.5 26.0 - 34.0 pg   MCHC 32.2 30.0 - 36.0 g/dL   RDW 26.0 88.8 - 35.8 %   Platelets 327 150 - 400 K/uL  Differential Status: None   Collection Time: 12/09/15 9:44 AM  Result Value Ref Range   Neutrophils Relative % 45 %   Neutro Abs 2.5 1.7 - 7.7 K/uL   Lymphocytes Relative 42 %   Lymphs Abs 2.3 0.7 - 4.0 K/uL   Monocytes Relative 7 %   Monocytes Absolute 0.4 0.1 - 1.0 K/uL   Eosinophils Relative 6 %   Eosinophils Absolute 0.4 0.0 - 0.7 K/uL   Basophils Relative 0 %    Basophils Absolute 0.0 0.0 - 0.1 K/uL  Protime-INR Status: None   Collection Time: 12/09/15 9:44 AM  Result Value Ref Range   Prothrombin Time 14.3 11.6 - 15.2 seconds   INR 1.09 0.00 - 1.49  APTT Status: None   Collection Time: 12/09/15 9:44 AM  Result Value Ref Range   aPTT 29 24 - 37 seconds  Comprehensive metabolic panel Status: Abnormal   Collection Time: 12/09/15 9:44 AM  Result Value Ref Range   Sodium 135 135 - 145 mmol/L   Potassium 3.4 (L) 3.5 - 5.1 mmol/L   Chloride 103 101 - 111 mmol/L   CO2 23 22 - 32 mmol/L   Glucose, Bld 412 (H) 65 - 99 mg/dL   BUN 11 6 - 20 mg/dL   Creatinine, Ser 4.46 (H) 0.61 - 1.24 mg/dL   Calcium 9.4 8.9 - 52.0 mg/dL   Total Protein 7.6 6.5 - 8.1 g/dL   Albumin 3.5 3.5 - 5.0 g/dL   AST 31 15 - 41 U/L   ALT 23 17 - 63 U/L   Alkaline Phosphatase 60 38 - 126 U/L   Total Bilirubin 0.9 0.3 - 1.2 mg/dL   GFR calc non Af Amer 42 (L) >60 mL/min   GFR calc Af Amer 48 (L) >60 mL/min    Comment: (NOTE) The eGFR has been calculated using the CKD EPI equation. This calculation has not been validated in all clinical situations. eGFR's persistently <60 mL/min signify possible Chronic Kidney Disease.    Anion gap 9 5 - 15  Troponin I Status: Abnormal   Collection Time: 12/09/15 9:44 AM  Result Value Ref Range   Troponin I 3.04 (HH) <0.031 ng/mL    Comment:   POSSIBLE MYOCARDIAL ISCHEMIA. SERIAL TESTING RECOMMENDED. CRITICAL RESULT CALLED TO, READ BACK BY AND VERIFIED WITH: Mark BRANCH,RN AT 1034 12/09/15 BY ZBEECH.   Lipid panel Status: Abnormal   Collection Time: 12/09/15 9:49 AM  Result Value Ref Range   Cholesterol 213 (H) 0 - 200 mg/dL   Triglycerides 761 (H) <150 mg/dL   HDL 25 (L) >91 mg/dL   Total CHOL/HDL Ratio 8.5 RATIO   VLDL 41 (H) 0 - 40 mg/dL   LDL  Cholesterol 550 (H) 0 - 99 mg/dL    Comment:   Total Cholesterol/HDL:CHD Risk Coronary Heart Disease Risk Table  Men Women 1/2 Average Risk 3.4 3.3 Average Risk 5.0 4.4 2 X Average Risk 9.6 7.1 3 X Average Risk 23.4 11.0   Use the calculated Patient Ratio above and the CHD Risk Table to determine the patient's CHD Risk.   ATP III CLASSIFICATION (LDL): <100 mg/dL Optimal 271-423  mg/dL Near or Above  Optimal 130-159 mg/dL Borderline 160-189 mg/dL High >190 mg/dL Very High       Imaging Results (Last 48 hours)    Dg Chest Port 1 View  12/09/2015 CLINICAL DATA: 65 year old male with a history of chest pain EXAM: PORTABLE CHEST 1 VIEW COMPARISON: 11/21/2007 FINDINGS: Cardiomediastinal silhouette unchanged. Surgical changes of median sternotomy and CABG. Ill-defined reticulonodular opacity bilateral lungs. No pneumothorax. No large confluent airspace disease. No large pleural effusion. No displaced fracture. IMPRESSION: Ill-defined reticular nodular opacities bilaterally. This is nonspecific, potentially secondary to atelectasis given the lung volumes, however, acute pneumonitis for atypical infection could have this appearance. Surgical changes of median sternotomy and CABG. Signed, Dulcy Fanny. Earleen Newport, DO [facility] Radiology Electronically Signed By: Corrie Mckusick D.O. On: 12/09/2015 10:02     ROS: General:no colds or fevers, no weight changes Skin:no rashes or ulcers HEENT:no blurred vision, no congestion CV:see HPI PUL:see HPI GI:no diarrhea constipation or melena, no indigestion GU:no hematuria, no dysuria MS:no joint pain, no claudication Neuro:no syncope, no lightheadedness Endo:+ diabetes, no thyroid disease  Blood pressure 174/80, pulse 59, SpO2 100 %.  Wt Readings from Last 3 Encounters:  03/25/15  228 lb 6.4 oz (103.602 kg)  01/17/15 225 lb (102.059 kg)  01/01/15 225 lb 12.8 oz (102.422 kg)    PE: General:Pleasant affect, obviously does not fel well. Skin:Warm and dry, brisk capillary refill HEENT:normocephalic, sclera clear, mucus membranes moist Neck:supple, no JVD, no bruits  Heart:S1S2 RRR without murmur, gallup, rub or click, 2+ pedal bil, 2+ radial bil  Lungs:clear without rales, rhonchi, or wheezes HKV:QQVZD, soft, + diffuse tenderness, + BS, do not palpate liver spleen or masses Ext:no lower ext edema, 2+ pedal pulses, 2+ radial pulses Neuro:alert and oriented X 3, MAE, follows commands, + facial symmetry  SB   Assessment/Plan Principal Problem:  Chest pain, with elevated troponin, now chest pain improved, if abd CT stable will add IV heparin and proceed with cardiac cath today or tomorrow depending on results and stablility. If give fluids overnight to improve kidney function would be optimal with CT of abd as well today.  ---agree with admit and serial troponins. Resume statin.  --heparin if CT abd stable.  Active Problems:  Abdominal pain- for abd CT  Hypertension- controlled  Coronary artery disease hx CABG 2007 last nuc 12/2014 was stable.   Cardiomyopathy ([place]) hx EF 42%  Hypercholesteremia- held his atorvastatin for last week due to muscle cramping, but has not changed symptoms ,  CKD- new elevation- IV fluids. monitor DM-2 on insulin with hyperglycemia   Dr. Stanford Breed to see.  Cecilie Kicks Nurse Practitioner Certified [place] Pager [phone] or after 5pm or weekends call [phone] 12/09/2015, 11:43 AM  As above, patient seen and examined. Briefly he is a 65 year old male with past medical history of coronary artery disease s/p CABG, diabetes mellitus, hypertension, hyperlipidemia with non-ST elevation myocardial infarction. Patient states he has had worsening exertional angina recently. This morning he  developed chest and abdominal pain. It was described as a tightness without radiation. Similar to previous infarct. Associated nausea, diaphoresis and dyspnea. Chest pain resolved in ER with nitroglycerin. Abdominal pain persists but improved. Note he was hospitalized at the [facility] for abdominal pain similar to this 6 months ago with results unavailable. He has had mild intermittent abdominal pain since. Creatinine is minimally elevated at 1.64. Troponin 3.04. Electrocardiogram shows sinus rhythm, septal infarct and nonspecific ST changes. 1 Non-ST  elevation myocardial infarction-Patient has had progressive angina and has now ruled in. Plan aspirin, heparin and continue beta blocker. Add Lipitor 80 mg daily. Patient will require cardiac catheterization. However his creatinine is newly elevated. He also received contrast for abdominal CT in the emergency room. Hold hydrochlorothiazide. Hydrate. Recheck renal function tomorrow morning. If stable proceed with catheterization without ventriculogram in AM. 2 abdominal pain-etiology unclear. CT scan unremarkable. Liver functions normal. He apparently had a similar episode 6 months ago and has had mild pain intermittent pain since. Will follow. Further evaluation for recurrent symptoms. 3 Acute kidney disease-creatinine minimally elevated. Hydrate and follow renal function 4 Diabetes mellitus-continue preadmission medications but hold Glucophage for 48 hours following catheterization. 5 hypertension-continue preadmission medications. Kirk Ruths

## 2015-12-09 NOTE — Progress Notes (Signed)
   12/09/15 0900  Clinical Encounter Type  Visited With Patient  Visit Type ED  Referral From Nurse  Spiritual Encounters  Spiritual Needs Emotional  Stress Factors  Patient Stress Factors Health changes;Loss of control  Family Stress Factors Not reviewed   Chaplain responded to Code Stemi in ED. Pt arrived with no family from routine medical appt.  EMS stated no family came with pt.

## 2015-12-09 NOTE — ED Provider Notes (Signed)
CSN: [id number]     Arrival date & time 12/09/15  0941 History   First MD Initiated Contact with Patient 12/09/15 [phone]     Chief Complaint  Patient presents with  . Code STEMI   PT IS A 65 YO BM BROUGHT TO THE ED WITH A CODE STEMI.  THE PT WENT TO HIS DR THIS AM FOR A ROUTINE VISIT.  BY THE TIME HE ARRIVED AT HIS DR'S OFFICE, HE WAS DIAPHORETIC AND HE WAS C/O CP.  THE DR CALLED EMS AND EMS DID AN EKG WHICH DID SHOW SOME ELEVATION IN THE LATERAL LEADS.  THEY DID GIVE PT 2 NITRO WHICH HELPED HIS CP.  THE PT ALSO C/O ABDOMINAL PAIN  AND STILL HAS THAT NOW.   (Consider location/radiation/quality/duration/timing/severity/associated sxs/prior Treatment) The history is provided by the patient.    Past Medical History  Diagnosis Date  . Arrhythmia     NSVT  . Diabetes mellitus     type II  . Hypertension   . Hypercholesteremia   . History of tobacco use   . Coronary artery disease     2007 status post CABG X 4  (left internal mammary artery to left anterior descending, saphenous vein graft to diagonal, saphenous  vein graft to obtuse marginal, saphenous vein graft to right coronary   artery).  . SVT (supraventricular tachycardia) (HCC)   . Cardiomyopathy     EF 45%  . Bell's palsy    Past Surgical History  Procedure Laterality Date  . Cholecystectomy    . Lithotripsy    . Coronary artery bypass graft    . Cataract extraction, bilateral     Family History  Problem Relation Age of Onset  . Hypertension Mother   . Diabetes Mother    Social History  Substance Use Topics  . Smoking status: Former Smoker    Quit date: 02/10/2003  . Smokeless tobacco: None  . Alcohol Use: Yes    Review of Systems  Cardiovascular: Positive for chest pain.  Gastrointestinal: Positive for abdominal pain.  All other systems reviewed and are negative.     Allergies  Review of patient's allergies indicates no known allergies.  Home Medications   Prior to Admission medications   Medication  Sig Start Date End Date Taking? Authorizing Provider  aspirin 325 MG tablet Take 325 mg by mouth daily.     Yes Historical Provider, MD  carvedilol (COREG) 12.5 MG tablet take 1 tablet by mouth twice a day with meals 02/24/13  Yes Rollene Rotunda, MD  diclofenac (VOLTAREN) 75 MG EC tablet Take 75 mg by mouth 2 (two) times daily as needed (pain).    Yes Historical Provider, MD  HUMULIN 70/30 (70-30) 100 UNIT/ML injection Inject 100 Units into the skin 2 (two) times daily. 11/05/15  Yes Historical Provider, MD  hydrochlorothiazide (MICROZIDE) 12.5 MG capsule take 1 capsule by mouth once daily 02/24/13  Yes Rollene Rotunda, MD  metFORMIN (GLUMETZA) 500 MG (MOD) 24 hr tablet Take 500 mg by mouth 2 (two) times daily with a meal.     Yes Historical Provider, MD  Omega-3 Fatty Acids (FISH OIL) 1000 MG CAPS Take 1 capsule by mouth 2 (two) times daily.     Yes Historical Provider, MD  omeprazole (PRILOSEC) 20 MG capsule Take 20 mg by mouth daily.     Yes Historical Provider, MD  vitamin B-12 (CYANOCOBALAMIN) 500 MCG tablet Take 500 mcg by mouth 2 (two) times daily.     Yes Historical  Provider, MD   BP 150/77 mmHg  Pulse 62  Resp 15  SpO2 100% Physical Exam  Constitutional: He appears well-developed and well-nourished.  HENT:  Head: Normocephalic and atraumatic.  Right Ear: External ear normal.  Left Ear: External ear normal.  Nose: Nose normal.  Mouth/Throat: Oropharynx is clear and moist.  Eyes: Conjunctivae and EOM are normal. Pupils are equal, round, and reactive to light.  Neck: Normal range of motion. Neck supple.  Cardiovascular: Normal rate, regular rhythm, normal heart sounds and intact distal pulses.   Pulmonary/Chest: Effort normal and breath sounds normal.  Abdominal: There is tenderness.  Musculoskeletal: Normal range of motion.  Neurological: He is alert.  Skin: Skin is warm. He is diaphoretic.  Psychiatric: He has a normal mood and affect. His behavior is normal. Judgment and thought  content normal.  Nursing note and vitals reviewed.   ED Course  Procedures (including critical care time) Labs Review Labs Reviewed  CBC - Abnormal; Notable for the following:    Hemoglobin 12.2 (*)    HCT 37.9 (*)    All other components within normal limits  COMPREHENSIVE METABOLIC PANEL - Abnormal; Notable for the following:    Potassium 3.4 (*)    Glucose, Bld 412 (*)    Creatinine, Ser 1.64 (*)    GFR calc non Af Amer 42 (*)    GFR calc Af Amer 48 (*)    All other components within normal limits  TROPONIN I - Abnormal; Notable for the following:    Troponin I 3.04 (*)    All other components within normal limits  LIPID PANEL - Abnormal; Notable for the following:    Cholesterol 213 (*)    Triglycerides 207 (*)    HDL 25 (*)    VLDL 41 (*)    LDL Cholesterol 147 (*)    All other components within normal limits  DIFFERENTIAL  PROTIME-INR  APTT  LIPASE, BLOOD  URINALYSIS, ROUTINE W REFLEX MICROSCOPIC (NOT [facility])  MAGNESIUM  TSH  HEMOGLOBIN A1C  HEPARIN LEVEL (UNFRACTIONATED)    Imaging Review Ct Abdomen Pelvis W Contrast  12/09/2015  CLINICAL DATA:  65 year old male with a history of abdominal pain EXAM: CT ABDOMEN AND PELVIS WITH CONTRAST TECHNIQUE: Multidetector CT imaging of the abdomen and pelvis was performed using the standard protocol following bolus administration of intravenous contrast. CONTRAST:  1 ISOVUE-300 IOPAMIDOL (ISOVUE-300) INJECTION 61% COMPARISON:  CT 01/10/2009 FINDINGS: Lower chest: Surgical changes median sternotomy. Heart size within normal limits.  No pericardial fluid/thickening. Calcifications in the distribution the native coronary arteries. No lower mediastinal adenopathy. Unremarkable appearance of the distal esophagus. No hiatal hernia. No confluent airspace disease, pleural fluid, or pneumothorax within visualized lung. Atelectasis/scarring at the lung bases. Abdomen/pelvis: Unremarkable appearance of liver and spleen. Unremarkable  appearance of bilateral adrenal glands. Unremarkable pancreas. Cholecystectomy No intrahepatic or extrahepatic biliary ductal dilatation. No intra-peritoneal free air or significant free-fluid. No abnormally dilated small bowel or colon. No transition point. No inflammatory changes of the mesenteries. Normal appendix identified. Colonic diverticular disease.  No associated inflammatory changes. Right Kidney/Ureter: No hydronephrosis. Punctate calcifications in the region the collecting system. Small hypodense lesion on the posterior cortex of the right kidney, too small to completely characterize. Unremarkable course of the right ureter. Left Kidney/Ureter: Nonobstructive stone measures 9 mm at the posterior collecting system of the right kidney, stone actually measures smaller than the comparison CT. No hydronephrosis. Small hypodensities on lateral cortex the left kidney, too small to characterize.  Unremarkable course of the left ureter. Unremarkable appearance of the urinary bladder. Unremarkable appearance of the prostate. Atherosclerotic calcifications.  No aneurysm or periaortic fluid. Musculoskeletal: No displaced fracture identified. No significant degenerative changes of the spine. IMPRESSION: No acute CT finding to account for the patient's symptoms of abdominal pain. Bilateral nonobstructive nephrolithiasis. Diverticular disease without evidence of acute diverticulitis. Signed, Yvone Neu. Loreta Ave, DO [facility] Radiology Electronically Signed   By: Gilmer Mor D.O.   On: 12/09/2015 12:03   Dg Chest Port 1 View  12/09/2015  CLINICAL DATA:  65 year old male with a history of chest pain EXAM: PORTABLE CHEST 1 VIEW COMPARISON:  11/21/2007 FINDINGS: Cardiomediastinal silhouette unchanged. Surgical changes of median sternotomy and CABG. Ill-defined reticulonodular opacity bilateral lungs. No pneumothorax. No large confluent airspace disease. No large pleural  effusion. No displaced fracture. IMPRESSION: Ill-defined reticular nodular opacities bilaterally. This is nonspecific, potentially secondary to atelectasis given the lung volumes, however, acute pneumonitis for atypical infection could have this appearance. Surgical changes of median sternotomy and CABG. Signed, Yvone Neu. Loreta Ave, DO [facility] Radiology Electronically Signed   By: Gilmer Mor D.O.   On: 12/09/2015 10:02   I have personally reviewed and evaluated these images and lab results as part of my medical decision-making.   EKG Interpretation   Date/Time:  Monday December 09 2015 09:42:03 EDT Ventricular Rate:  57 PR Interval:    QRS Duration: 100 QT Interval:  484 QTC Calculation: 472 R Axis:   -15 Text Interpretation:  Sinus rhythm Borderline left axis deviation  Anteroseptal infarct, old Minimal ST depression, diffuse leads Confirmed  by Mithran Strike MD, Jermario Kalmar ([id number]) on 12/09/2015 11:18:02 AM      MDM  PT SEEN IN THE ED IMMEDIATELY BY DR. Clifton Dane AND HE THOUGHT PT DID NOT NEED TO GO DIRECTLY TO CATH LAB AS PT WAS HAVING ABD PAIN TOO, AND WE WANTED TO EVALUATE THAT AS WELL.  AFTER CT ABD CAME BACK UNREMARKABLE, THE PT WAS EVAL BY DR. Jens Som WHO WILL ADMIT PT FOR NSTEMI. Final diagnoses:  NSTEMI (non-ST elevated myocardial infarction) (HCC)       Jacalyn Lefevre, MD 12/09/15 [phone]

## 2015-12-09 NOTE — Progress Notes (Signed)
Second troponin is neg.  Dr. Jens Somrenshaw would still like pt cathed if Cr stable.

## 2015-12-09 NOTE — ED Notes (Signed)
CBG 434 

## 2015-12-09 NOTE — Progress Notes (Signed)
ANTICOAGULATION CONSULT NOTE - Initial Consult  Pharmacy Consult for Heparin Indication: chest pain/ACS  No Known Allergies  Patient Measurements: TBW 104 kg IBW 59.2 kg Heparin Dosing Weight: 83 kg  Vital Signs: BP: 163/79 mmHg (06/19 1214) Pulse Rate: 62 (06/19 1214)  Labs:  Recent Labs  12/09/15 0944  HGB 12.2*  HCT 37.9*  PLT 327  APTT 29  LABPROT 14.3  INR 1.09  CREATININE 1.64*  TROPONINI 3.04*    CrCl cannot be calculated (Unknown ideal weight.).   Medical History: Past Medical History  Diagnosis Date  . Arrhythmia     NSVT  . Diabetes mellitus     type II  . Hypertension   . Hypercholesteremia   . History of tobacco use   . Coronary artery disease     2007 status post CABG X 4  (left internal mammary artery to left anterior descending, saphenous vein graft to diagonal, saphenous  vein graft to obtuse marginal, saphenous vein graft to right coronary   artery).  . SVT (supraventricular tachycardia) (HCC)   . Cardiomyopathy     EF 45%  . Bell's palsy     Assessment: [name] presents on 6/19 with chest pain. Pharmacy consulted to start heparin. 4,000 units of heparin [place] given about 2 hrs ago. No anticoag PTA. Hgb low at 12.2, plts wnl. No s/s of bleed.  Goal of Therapy:  Heparin level 0.3-0.7 units/ml Monitor platelets by anticoagulation protocol: Yes   Plan:  Start heparin gtt at 1,000 units/hr Check 6 hr HL Monitor daily HL, CBC, s/s of bleed   Mark Fernandez 12/09/2015,12:43 PM

## 2015-12-09 NOTE — ED Notes (Signed)
Per EMS: pt here from PCP with CP and abd pain; pt pale and diaphoretic; pain started this am; pt given 2 SL nitro and 4mg  Zofran; pt given 3 325mg  ASA by PCP; 20g right hand; pt sts pain is 8 at present

## 2015-12-09 NOTE — Progress Notes (Signed)
ANTICOAGULATION CONSULT NOTE  Pharmacy Consult for Heparin Indication: chest pain/ACS  No Known Allergies  Patient Measurements: TBW 104 kg IBW 59.2 kg Heparin Dosing Weight: 83 kg  Vital Signs: Temp: 97.5 F (36.4 C) (06/19 2009) Temp Source: Oral (06/19 2009) BP: 146/80 mmHg (06/19 2009) Pulse Rate: 68 (06/19 2009)  Labs:  Recent Labs  12/09/15 0944 12/09/15 1547 12/09/15 2048  HGB 12.2*  --   --   HCT 37.9*  --   --   PLT 327  --   --   APTT 29  --   --   LABPROT 14.3 15.3*  --   INR 1.09 1.19  --   HEPARINUNFRC  --   --  0.44  CREATININE 1.64*  --   --   TROPONINI 3.04* 0.03  --     Estimated Creatinine Clearance: 47.6 mL/min (by C-G formula based on Cr of 1.64).   Assessment: [name] presents on 6/19 with chest pain. Pharmacy consulted to start heparin. No anticoag PTA. Hgb low at 12.2, plts wnl. No s/s of bleed.  First heparin level in range at 0.44 units/mL. No bleeding noted.  Goal of Therapy:  Heparin level 0.3-0.7 units/ml Monitor platelets by anticoagulation protocol: Yes   Plan:  Continue heparin gtt at 1,000 units/hr Monitor daily HL, CBC, s/s of bleed Follow for cardiology plans for cath based on renal function  Kingson Lohmeyer D. Janeva Peaster, PharmD, BCPS Clinical Pharmacist Pager: [phone] 12/09/2015 9:24 PM

## 2015-12-10 ENCOUNTER — Inpatient Hospital Stay (HOSPITAL_COMMUNITY): Payer: Medicare Other

## 2015-12-10 ENCOUNTER — Encounter (HOSPITAL_COMMUNITY)
Admission: EM | Disposition: A | Discharge status: Home / Self Care | Payer: Self-pay | Source: Home / Self Care | Attending: Cardiology

## 2015-12-10 ENCOUNTER — Encounter (HOSPITAL_COMMUNITY): Payer: Self-pay | Admitting: Cardiovascular Disease

## 2015-12-10 DIAGNOSIS — I25110 Atherosclerotic heart disease of native coronary artery with unstable angina pectoris: Secondary | ICD-10-CM

## 2015-12-10 DIAGNOSIS — R079 Chest pain, unspecified: Secondary | ICD-10-CM

## 2015-12-10 HISTORY — PX: CARDIAC CATHETERIZATION: SHX172

## 2015-12-10 LAB — ECHOCARDIOGRAM COMPLETE
E/e' ratio: 15.18
EWDT: 165 ms
FS: 28 % (ref 28–44)
HEIGHTINCHES: 64 in
IV/PV OW: 1.03
LA diam index: 1.89 cm/m2
LA vol index: 32.5 mL/m2
LASIZE: 39 mm
LAVOL: 67 mL
LAVOLA4C: 73.7 mL
LEFT ATRIUM END SYS DIAM: 39 mm
LV TDI E'MEDIAL: 6.2
LV e' LATERAL: 7.51 cm/s
LVEEAVG: 15.18
LVEEMED: 15.18
LVOT area: 2.84 cm2
LVOT diameter: 19 mm
MV Dec: 165
MV Peak grad: 5 mmHg
MV pk E vel: 114 m/s
MVPKAVEL: 108 m/s
PW: 13.4 mm — AB (ref 0.6–1.1)
TAPSE: 21.9 mm
TDI e' lateral: 7.51
WEIGHTICAEL: 3604.8 [oz_av]

## 2015-12-10 LAB — GLUCOSE, CAPILLARY
GLUCOSE-CAPILLARY: 370 mg/dL — AB (ref 65–99)
GLUCOSE-CAPILLARY: 418 mg/dL — AB (ref 65–99)
GLUCOSE-CAPILLARY: 491 mg/dL — AB (ref 65–99)
GLUCOSE-CAPILLARY: 334 mg/dL — AB (ref 65–99)
GLUCOSE-CAPILLARY: 202 mg/dL — AB (ref 65–99)
Glucose-Capillary: 422 mg/dL — ABNORMAL HIGH (ref 65–99)
Glucose-Capillary: 427 mg/dL — ABNORMAL HIGH (ref 65–99)

## 2015-12-10 LAB — HEMOGLOBIN A1C
Hgb A1c MFr Bld: 10.9 % — ABNORMAL HIGH (ref 4.8–5.6)
Mean Plasma Glucose: 266 mg/dL

## 2015-12-10 LAB — CREATININE, SERUM
Creatinine, Ser: 1.41 mg/dL — ABNORMAL HIGH (ref 0.61–1.24)
GFR calc non Af Amer: 50 mL/min — ABNORMAL LOW (ref >60)
GFR, EST AFRICAN AMERICAN: 58 mL/min — AB (ref >60)

## 2015-12-10 LAB — CBC
HCT: 34.5 % — ABNORMAL LOW (ref 39.0–52.0)
Hemoglobin: 11.2 g/dL — ABNORMAL LOW (ref 13.0–17.0)
MCH: 27.3 pg (ref 26.0–34.0)
MCHC: 32.5 g/dL (ref 30.0–36.0)
MCV: 84.1 fL (ref 78.0–100.0)
PLATELETS: 275 10*3/uL (ref 150–400)
RBC: 4.1 MIL/uL — AB (ref 4.22–5.81)
RDW: 14.2 % (ref 11.5–15.5)
WBC: 6.4 10*3/uL (ref 4.0–10.5)

## 2015-12-10 LAB — BASIC METABOLIC PANEL
Anion gap: 9 (ref 5–15)
BUN: 17 mg/dL (ref 6–20)
CHLORIDE: 104 mmol/L (ref 101–111)
CO2: 26 mmol/L (ref 22–32)
Calcium: 9.2 mg/dL (ref 8.9–10.3)
Creatinine, Ser: 1.51 mg/dL — ABNORMAL HIGH (ref 0.61–1.24)
GFR calc Af Amer: 53 mL/min — ABNORMAL LOW (ref >60)
GFR calc non Af Amer: 46 mL/min — ABNORMAL LOW (ref >60)
Glucose, Bld: 264 mg/dL — ABNORMAL HIGH (ref 65–99)
POTASSIUM: 4 mmol/L (ref 3.5–5.1)
SODIUM: 139 mmol/L (ref 135–145)

## 2015-12-10 LAB — TROPONIN I: Troponin I: 0.03 ng/mL (ref <0.031)

## 2015-12-10 LAB — HEPARIN LEVEL (UNFRACTIONATED): HEPARIN UNFRACTIONATED: 0.52 [IU]/mL (ref 0.30–0.70)

## 2015-12-10 SURGERY — LEFT HEART CATH AND CORS/GRAFTS ANGIOGRAPHY

## 2015-12-10 MED ORDER — ONDANSETRON HCL 4 MG/2ML IJ SOLN: 4.0000 mg | Freq: Four times a day (QID) | INTRAMUSCULAR | Status: DC | PRN

## 2015-12-10 MED ORDER — BISACODYL 5 MG PO TBEC
5.0000 mg | DELAYED_RELEASE_TABLET | Freq: Every evening | ORAL | Status: DC | PRN
  Administered 2015-12-10: 5 mg via ORAL
  Filled 2015-12-10: qty 1

## 2015-12-10 MED ORDER — IOPAMIDOL (ISOVUE-370) INJECTION 76%
INTRAVENOUS | Status: AC
  Filled 2015-12-10: qty 125

## 2015-12-10 MED ORDER — INSULIN ASPART PROT & ASPART (70-30 MIX) 100 UNIT/ML ~~LOC~~ SUSP
25.0000 [IU] | Freq: Every day | SUBCUTANEOUS | Status: DC
  Administered 2015-12-10: 25 [IU] via SUBCUTANEOUS
  Filled 2015-12-10: qty 10

## 2015-12-10 MED ORDER — LIDOCAINE HCL (PF) 1 % IJ SOLN
INTRAMUSCULAR | Status: DC | PRN
  Administered 2015-12-10: 20 mL

## 2015-12-10 MED ORDER — LIDOCAINE HCL (PF) 1 % IJ SOLN
INTRAMUSCULAR | Status: AC
  Filled 2015-12-10: qty 30

## 2015-12-10 MED ORDER — MIDAZOLAM HCL 2 MG/2ML IJ SOLN
INTRAMUSCULAR | Status: AC
  Filled 2015-12-10: qty 2

## 2015-12-10 MED ORDER — ASPIRIN EC 81 MG PO TBEC
81.0000 mg | DELAYED_RELEASE_TABLET | Freq: Every day | ORAL | Status: DC
  Administered 2015-12-11: 81 mg via ORAL
  Filled 2015-12-10: qty 1

## 2015-12-10 MED ORDER — DIAZEPAM 5 MG PO TABS: 5.0000 mg | ORAL_TABLET | ORAL | Status: DC | PRN

## 2015-12-10 MED ORDER — SODIUM CHLORIDE 0.9% FLUSH
3.0000 mL | Freq: Two times a day (BID) | INTRAVENOUS | Status: DC
  Administered 2015-12-10: 10 mL via INTRAVENOUS
  Administered 2015-12-11: 3 mL via INTRAVENOUS

## 2015-12-10 MED ORDER — SODIUM CHLORIDE 0.9 % IV SOLN
INTRAVENOUS | Status: DC
Start: 2015-12-10 — End: 2015-12-10

## 2015-12-10 MED ORDER — ACETAMINOPHEN 325 MG PO TABS
650.0000 mg | ORAL_TABLET | ORAL | Status: DC | PRN
Start: 2015-12-10 — End: 2015-12-10
  Administered 2015-12-10: 650 mg via ORAL
  Filled 2015-12-10: qty 2

## 2015-12-10 MED ORDER — FENTANYL CITRATE (PF) 100 MCG/2ML IJ SOLN
INTRAMUSCULAR | Status: AC
  Filled 2015-12-10: qty 2

## 2015-12-10 MED ORDER — MIDAZOLAM HCL 2 MG/2ML IJ SOLN
INTRAMUSCULAR | Status: DC | PRN
  Administered 2015-12-10: 2 mg via INTRAVENOUS

## 2015-12-10 MED ORDER — IOPAMIDOL (ISOVUE-370) INJECTION 76%
INTRAVENOUS | Status: DC | PRN
  Administered 2015-12-10: 105 mL via INTRA_ARTERIAL

## 2015-12-10 MED ORDER — INSULIN ASPART PROT & ASPART (70-30 MIX) 100 UNIT/ML ~~LOC~~ SUSP
45.0000 [IU] | Freq: Every day | SUBCUTANEOUS | Status: DC
  Administered 2015-12-11: 45 [IU] via SUBCUTANEOUS
  Filled 2015-12-10: qty 10

## 2015-12-10 MED ORDER — FENTANYL CITRATE (PF) 100 MCG/2ML IJ SOLN
INTRAMUSCULAR | Status: DC | PRN
  Administered 2015-12-10: 25 ug via INTRAVENOUS

## 2015-12-10 MED ORDER — ENOXAPARIN SODIUM 40 MG/0.4ML ~~LOC~~ SOLN
40.0000 mg | SUBCUTANEOUS | Status: DC
  Administered 2015-12-11: 40 mg via SUBCUTANEOUS
  Filled 2015-12-10: qty 0.4

## 2015-12-10 MED ORDER — ATORVASTATIN CALCIUM 80 MG PO TABS
80.0000 mg | ORAL_TABLET | Freq: Every day | ORAL | Status: DC
  Administered 2015-12-10: 80 mg via ORAL
  Filled 2015-12-10: qty 1

## 2015-12-10 MED ORDER — INSULIN ASPART PROT & ASPART (70-30 MIX) 100 UNIT/ML ~~LOC~~ SUSP: 25.0000 [IU] | Freq: Two times a day (BID) | SUBCUTANEOUS | Status: DC

## 2015-12-10 MED ORDER — NITROGLYCERIN 1 MG/10 ML FOR IR/CATH LAB
INTRA_ARTERIAL | Status: DC | PRN
  Administered 2015-12-10: 09:00:00

## 2015-12-10 MED ORDER — HEPARIN (PORCINE) IN NACL 2-0.9 UNIT/ML-% IJ SOLN
INTRAMUSCULAR | Status: AC
  Filled 2015-12-10: qty 1000

## 2015-12-10 MED ORDER — INSULIN ASPART 100 UNIT/ML ~~LOC~~ SOLN
15.0000 [IU] | Freq: Once | SUBCUTANEOUS | Status: AC
  Administered 2015-12-10: 15 [IU] via SUBCUTANEOUS

## 2015-12-10 MED ORDER — SODIUM CHLORIDE 0.9 % IV SOLN: 250.0000 mL | INTRAVENOUS | Status: DC | PRN

## 2015-12-10 MED ORDER — SODIUM CHLORIDE 0.9% FLUSH: 3.0000 mL | INTRAVENOUS | Status: DC | PRN

## 2015-12-10 MED ORDER — IOPAMIDOL (ISOVUE-370) INJECTION 76%
INTRAVENOUS | Status: AC
  Filled 2015-12-10: qty 50

## 2015-12-10 SURGICAL SUPPLY — 10 items
CATH INFINITI 5 FR IM (CATHETERS) ×2 IMPLANT
CATH INFINITI 5 FR RCB (CATHETERS) ×2 IMPLANT
CATH INFINITI 5FR MULTPACK ANG (CATHETERS) ×2 IMPLANT
GUIDEWIRE ANGLED .035X150CM (WIRE) ×2 IMPLANT
KIT HEART LEFT (KITS) ×3 IMPLANT
PACK CARDIAC CATHETERIZATION (CUSTOM PROCEDURE TRAY) ×3 IMPLANT
SHEATH PINNACLE 5F 10CM (SHEATH) ×2 IMPLANT
TRANSDUCER W/STOPCOCK (MISCELLANEOUS) ×3 IMPLANT
WIRE EMERALD 3MM-J .035X150CM (WIRE) ×2 IMPLANT
WIRE EMERALD 3MM-J .035X260CM (WIRE) ×2 IMPLANT

## 2015-12-10 NOTE — Progress Notes (Signed)
Site area: RFA Site Prior to Removal:  Level 0 Pressure Applied For:20min Manual:  yes  Patient Status During Pull:  stable Post Pull Site:  Level 0 Post Pull Instructions Given: yes  Post Pull Pulses Present: palpable Dressing Applied: tegaderm  Bedrest begins @ 1010 till 1410 Comments:   

## 2015-12-10 NOTE — Progress Notes (Signed)
Inpatient Diabetes Program Recommendations  AACE/ADA: New Consensus Statement on Inpatient Glycemic Control (2015)  Target Ranges:  Prepandial:   less than 140 mg/dL      Peak postprandial:   less than 180 mg/dL (1-2 hours)      Critically ill patients:  140 - 180 mg/dL    Results for [name], Alben C (MRN [id number]) as of 12/10/2015 14:56  Ref. Range 12/09/2015 16:27 12/09/2015 20:47 12/10/2015 09:44 12/10/2015 11:58 12/10/2015 14:23  Glucose-Capillary Latest Ref Range: 65-99 mg/dL 409270 (H) 811198 (H) 914370 (H) 418 (H) 491 (H)    Admit CP  History: DM, CABG  Home DM Meds: Humulin 70/30 Insulin 100 units bidwc       Metformin 500 mg bid  Current Orders: 70/30 Insulin 45 units AM/ 25 units PM      Novolog Sensitive Correction Scale/ SSI (0-9 units) TID AC + HS     -Note patient's CBGs extremely elevated this AM.  -Per PA note from today, patient supposedly taking Humulin 70/30 Insulin at home- 45 units AM with breakfast and 25 units in the evening with dinner.  However, in the home med rec, Humulin 70/30 insulin is listed as 100 units bid with meals.  -Patient was only given 10 units 70/30 insulin last PM along with SSI.  This AM, patient was NPO and all insulin was held.  CBG at 12pm was up to 418 mg/dl and patient was given 10 units Novolog.  CBG now 491 mg/dl at 7:82NF2:43pm.  Patient received 15 units Novolog for this CBG.  -Note home dose of 70/30 insulin to start tonight at supper.  -May want to get some basal insulin on board for patient before suppertime tonight.  Could give small dose of NPH insulin and then start home dose of 70/30 insulin with supper tonight.  Called Oliver Pilaayne Dunn, PA to discuss.  Plan is to give 15 units Novolog for CBG of 491 mg/dl (given at 3pm by RN) and then restart 70/30 at 5pm today.     --Will follow patient during hospitalization--  Ambrose FinlandJeannine Johnston Draxton Luu RN, MSN, CDE Diabetes Coordinator Inpatient Glycemic Control Team Team Pager: [phone]  (8a-5p)

## 2015-12-10 NOTE — Progress Notes (Signed)
Spoke with pharmacist re: concurrent orders for continuous IV heparin and subcutaneous lovenox injection. Heparin to be discontinued; has not been infusing since prior to cardiac catheterization today. Lovenox will start tomorrow per orders.  Continuing to monitor.

## 2015-12-10 NOTE — Progress Notes (Signed)
  Echocardiogram 2D Echocardiogram has been performed.  Arvil ChacoFoster, Mark Fernandez 12/10/2015, 2:47 PM

## 2015-12-10 NOTE — Progress Notes (Signed)
Called by nurse earlier for hyperglycemia - CBG 418. He is on Humulin 45 units QAM, 25 units QPM at home. This was ordered on admission as 10 units BID. He did not get any this AM due to being NPO. I gave 10 units of Novolog for meal coverage -> CBG 2 hours later is still 491. D/w internal medicine curbside. Will give 15 units of Novolog now as coverage and increase Novolog mix 70/30 back to home dose. Will also order diabetes coordinator consult. Marquelle Balow PA-C

## 2015-12-10 NOTE — Interval H&P Note (Signed)
Cath Lab Visit (complete for each Cath Lab visit)  Clinical Evaluation Leading to the Procedure:   ACS: Yes.    Non-ACS:    Anginal Classification: CCS III  Anti-ischemic medical therapy: Maximal Therapy (2 or more classes of medications)  Non-Invasive Test Results: No non-invasive testing performed  Prior CABG: Previous CABG      History and Physical Interval Note:  12/10/2015 8:05 AM  Mark Fernandez  has presented today for surgery, with the diagnosis of mi - n stemi  The various methods of treatment have been discussed with the patient and family. After consideration of risks, benefits and other options for treatment, the patient has consented to  Procedure(s): Left Heart Cath and Cors/Grafts Angiography (N/A) as a surgical intervention .  The patient's history has been reviewed, patient examined, no change in status, stable for surgery.  I have reviewed the patient's chart and labs.  Questions were answered to the patient's satisfaction.     Mark Fernandez

## 2015-12-10 NOTE — H&P (View-Only) (Signed)
Reason for Consult: chest and abd pain and troponin 3.04   Referring Physician: Dr. Carlis Abbott   PCP:  Foye Spurling, MD  Primary Cardiologist:Dr. Margarette Asal is an 65 y.o. male.    Chief Complaint:  Martin Majestic to PCP with chest pain.    HPI: [name] with hx of CAD-2007 status post CABG X 4 (left internal mammary artery to left anterior descending, saphenous vein graft to diagonal, saphenous vein graft to obtuse marginal, saphenous vein graft to right coronary artery) EF 45%, SVT HDL, DM-2, HTN.  Last nuc 12/2014 with EF 42%, sm. Defect of mild severity anteroseptal and anteroapical.  Last cath prior to CABG.  Today pt went to PCP for routine visit but pain developed pain in chest (dull pressure) and on arrival to Dr. Ainsley Spinner office and was diaphoretic, SOB and nauseated.  2 NTG helped chest pain. EKG with ST ? Changes in ant leads but it was similar to previous EKG. He also has abd pain.  His troponin is 3.04.    Currently no chest pain but + abd pain, similar dull pressure.  abd tender to palpation with + BS.  He is for CT of ABD.  He had normal BM yesterday and no constipation.  He had stopped his atorvastatin a week ago due to muscle cramps.      EKG with SR ST depression inf lat leads.  K+ 3.4 Cr 1.64, troponin 3.04  T chol 213, TG 207, HDL 25, LDL 147,  CXR with  Ill-defined reticular nodular opacities bilaterally Waiting CT of abd.   Past Medical History  Diagnosis Date  . Arrhythmia     NSVT  . Diabetes mellitus     type II  . Hypertension   . Hypercholesteremia   . History of tobacco use   . Coronary artery disease     2007 status post CABG X 4  (left internal mammary artery to left anterior descending, saphenous vein graft to diagonal, saphenous  vein graft to obtuse marginal, saphenous vein graft to right coronary   artery).  . SVT (supraventricular tachycardia) ([place])   . Cardiomyopathy     EF 45%  . Bell's palsy     Past Surgical History    Procedure Laterality Date  . Cholecystectomy    . Lithotripsy    . Coronary artery bypass graft    . Cataract extraction, bilateral      Family History  Problem Relation Age of Onset  . Hypertension Mother   . Diabetes Mother    Social History:  reports that he quit smoking about 12 years ago. He does not have any smokeless tobacco history on file. He reports that he drinks alcohol. He reports that he does not use illicit drugs.  Allergies: No Known Allergies  OUTPATIENT MEDICATIONS: Meds:  systane 1 drop both eyes as needed, was on atorvastatin 80 mg, + insulin, centrum silver, KDur 20 meq daily, HCTZ 25 mg daily, omeprazole 20 mg daily, zantac prn, ASA 81 mg daily, amlodipine 10 mg daily, coreg 25 mg BID,    Results for orders placed or performed during the hospital encounter of 12/09/15 (from the past 48 hour(s))  Lipase, blood     Status: None   Collection Time: 12/09/15  9:40 AM  Result Value Ref Range   Lipase 26 11 - 51 U/L  CBC     Status: Abnormal   Collection Time: 12/09/15  9:44 AM  Result Value Ref Range   WBC 5.6 4.0 - 10.5 K/uL   RBC 4.61 4.22 - 5.81 MIL/uL   Hemoglobin 12.2 (L) 13.0 - 17.0 g/dL   HCT 51.9 (L) 82.4 - 29.9 %   MCV 82.2 78.0 - 100.0 fL   MCH 26.5 26.0 - 34.0 pg   MCHC 32.2 30.0 - 36.0 g/dL   RDW 80.6 99.9 - 67.2 %   Platelets 327 150 - 400 K/uL  Differential     Status: None   Collection Time: 12/09/15  9:44 AM  Result Value Ref Range   Neutrophils Relative % 45 %   Neutro Abs 2.5 1.7 - 7.7 K/uL   Lymphocytes Relative 42 %   Lymphs Abs 2.3 0.7 - 4.0 K/uL   Monocytes Relative 7 %   Monocytes Absolute 0.4 0.1 - 1.0 K/uL   Eosinophils Relative 6 %   Eosinophils Absolute 0.4 0.0 - 0.7 K/uL   Basophils Relative 0 %   Basophils Absolute 0.0 0.0 - 0.1 K/uL  Protime-INR     Status: None   Collection Time: 12/09/15  9:44 AM  Result Value Ref Range   Prothrombin Time 14.3 11.6 - 15.2 seconds   INR 1.09 0.00 - 1.49  APTT     Status: None    Collection Time: 12/09/15  9:44 AM  Result Value Ref Range   aPTT 29 24 - 37 seconds  Comprehensive metabolic panel     Status: Abnormal   Collection Time: 12/09/15  9:44 AM  Result Value Ref Range   Sodium 135 135 - 145 mmol/L   Potassium 3.4 (L) 3.5 - 5.1 mmol/L   Chloride 103 101 - 111 mmol/L   CO2 23 22 - 32 mmol/L   Glucose, Bld 412 (H) 65 - 99 mg/dL   BUN 11 6 - 20 mg/dL   Creatinine, Ser 2.77 (H) 0.61 - 1.24 mg/dL   Calcium 9.4 8.9 - 37.5 mg/dL   Total Protein 7.6 6.5 - 8.1 g/dL   Albumin 3.5 3.5 - 5.0 g/dL   AST 31 15 - 41 U/L   ALT 23 17 - 63 U/L   Alkaline Phosphatase 60 38 - 126 U/L   Total Bilirubin 0.9 0.3 - 1.2 mg/dL   GFR calc non Af Amer 42 (L) >60 mL/min   GFR calc Af Amer 48 (L) >60 mL/min    Comment: (NOTE) The eGFR has been calculated using the CKD EPI equation. This calculation has not been validated in all clinical situations. eGFR's persistently <60 mL/min signify possible Chronic Kidney Disease.    Anion gap 9 5 - 15  Troponin I     Status: Abnormal   Collection Time: 12/09/15  9:44 AM  Result Value Ref Range   Troponin I 3.04 (HH) <0.031 ng/mL    Comment:        POSSIBLE MYOCARDIAL ISCHEMIA. SERIAL TESTING RECOMMENDED. CRITICAL RESULT CALLED TO, READ BACK BY AND VERIFIED WITH: JESSICA BRANCH,RN AT 1034 12/09/15 BY ZBEECH.   Lipid panel     Status: Abnormal   Collection Time: 12/09/15  9:49 AM  Result Value Ref Range   Cholesterol 213 (H) 0 - 200 mg/dL   Triglycerides 051 (H) <150 mg/dL   HDL 25 (L) >07 mg/dL   Total CHOL/HDL Ratio 8.5 RATIO   VLDL 41 (H) 0 - 40 mg/dL   LDL Cholesterol 125 (H) 0 - 99 mg/dL    Comment:        Total Cholesterol/HDL:CHD Risk Coronary  Heart Disease Risk Table                     Men   Women  1/2 Average Risk   3.4   3.3  Average Risk       5.0   4.4  2 X Average Risk   9.6   7.1  3 X Average Risk  23.4   11.0        Use the calculated Patient Ratio above and the CHD Risk Table to determine the patient's  CHD Risk.        ATP III CLASSIFICATION (LDL):  <100     mg/dL   Optimal  100-129  mg/dL   Near or Above                    Optimal  130-159  mg/dL   Borderline  160-189  mg/dL   High  >190     mg/dL   Very High    Dg Chest Port 1 View  12/09/2015  CLINICAL DATA:  65 year old male with a history of chest pain EXAM: PORTABLE CHEST 1 VIEW COMPARISON:  11/21/2007 FINDINGS: Cardiomediastinal silhouette unchanged. Surgical changes of median sternotomy and CABG. Ill-defined reticulonodular opacity bilateral lungs. No pneumothorax. No large confluent airspace disease. No large pleural effusion. No displaced fracture. IMPRESSION: Ill-defined reticular nodular opacities bilaterally. This is nonspecific, potentially secondary to atelectasis given the lung volumes, however, acute pneumonitis for atypical infection could have this appearance. Surgical changes of median sternotomy and CABG. Signed, Dulcy Fanny. Earleen Newport, DO [facility] Radiology Electronically Signed   By: Corrie Mckusick D.O.   On: 12/09/2015 10:02    ROS: General:no colds or fevers, no weight changes Skin:no rashes or ulcers HEENT:no blurred vision, no congestion CV:see HPI PUL:see HPI GI:no diarrhea constipation or melena, no indigestion GU:no hematuria, no dysuria MS:no joint pain, no claudication Neuro:no syncope, no lightheadedness Endo:+ diabetes, no thyroid disease   Blood pressure 174/80, pulse 59, SpO2 100 %.  Wt Readings from Last 3 Encounters:  03/25/15 228 lb 6.4 oz (103.602 kg)  01/17/15 225 lb (102.059 kg)  01/01/15 225 lb 12.8 oz (102.422 kg)    PE: General:Pleasant affect, obviously does not fel well. Skin:Warm and dry, brisk capillary refill HEENT:normocephalic, sclera clear, mucus membranes moist Neck:supple, no JVD, no bruits  Heart:S1S2 RRR without murmur, gallup, rub or click, 2+ pedal bil, 2+ radial bil  Lungs:clear without rales, rhonchi, or  wheezes GGE:ZMOQH, soft, + diffuse tenderness, + BS, do not palpate liver spleen or masses Ext:no lower ext edema, 2+ pedal pulses, 2+ radial pulses Neuro:alert and oriented X 3, MAE, follows commands, + facial symmetry   SB   Assessment/Plan Principal Problem:   Chest pain, with elevated troponin, now chest pain improved, if abd CT stable will add IV heparin and proceed with cardiac cath today or tomorrow depending on results and stablility.  If give fluids overnight to improve kidney function would be optimal with CT of abd as well today.  ---agree with admit and serial troponins.  Resume statin.  --heparin if CT abd stable.  Active Problems:   Abdominal pain- for abd CT   Hypertension- controlled   Coronary artery disease hx CABG 2007 last nuc 12/2014 was stable.    Cardiomyopathy ([place]) hx EF 42%   Hypercholesteremia- held his atorvastatin for last week due to muscle cramping, but has not changed symptoms ,  CKD- new elevation- IV  fluids.  monitor DM-2 on insulin with hyperglycemia   Dr. Stanford Breed to see.  Cecilie Kicks  Nurse Practitioner Certified [place] Pager [phone] or after 5pm or weekends call [phone] 12/09/2015, 11:43 AM  As above, patient seen and examined. Briefly he is a 65 year old male with past medical history of coronary artery disease s/p CABG, diabetes mellitus, hypertension, hyperlipidemia with non-ST elevation myocardial infarction. Patient states he has had worsening exertional angina recently. This morning he developed chest and abdominal pain. It was described as a tightness without radiation. Similar to previous infarct. Associated nausea, diaphoresis and dyspnea. Chest pain resolved in ER with nitroglycerin. Abdominal pain persists but improved.  Note he was hospitalized at the [facility] for abdominal pain similar to this 6 months ago with results unavailable. He has had mild intermittent abdominal pain since. Creatinine is minimally  elevated at 1.64. Troponin 3.04. Electrocardiogram shows sinus rhythm, septal infarct and nonspecific ST changes. 1 Non-ST elevation myocardial infarction-Patient has had progressive angina and has now ruled in. Plan aspirin, heparin and continue beta blocker. Add Lipitor 80 mg daily. Patient will require cardiac catheterization. However his creatinine is newly elevated.  He also received contrast for abdominal CT in the emergency room. Hold hydrochlorothiazide. Hydrate. Recheck renal function tomorrow morning.  If stable proceed with catheterization without ventriculogram in AM. 2 abdominal pain-etiology unclear. CT scan unremarkable. Liver functions normal. He apparently had a similar episode 6 months ago and has had mild pain intermittent pain since. Will follow. Further evaluation for recurrent symptoms. 3 Acute kidney disease-creatinine minimally elevated. Hydrate and follow renal function 4  Diabetes mellitus-continue preadmission medications but hold Glucophage for 48 hours following catheterization. 5 hypertension-continue preadmission medications. Kirk Ruths

## 2015-12-11 ENCOUNTER — Other Ambulatory Visit: Payer: Self-pay

## 2015-12-11 ENCOUNTER — Other Ambulatory Visit (HOSPITAL_COMMUNITY): Payer: Medicare Other

## 2015-12-11 DIAGNOSIS — R072 Precordial pain: Secondary | ICD-10-CM

## 2015-12-11 LAB — BASIC METABOLIC PANEL
ANION GAP: 9 (ref 5–15)
BUN: 11 mg/dL (ref 6–20)
CHLORIDE: 103 mmol/L (ref 101–111)
CO2: 27 mmol/L (ref 22–32)
Calcium: 9.1 mg/dL (ref 8.9–10.3)
Creatinine, Ser: 1.16 mg/dL (ref 0.61–1.24)
GFR calc non Af Amer: >60 mL/min (ref >60)
Glucose, Bld: 202 mg/dL — ABNORMAL HIGH (ref 65–99)
Potassium: 3.2 mmol/L — ABNORMAL LOW (ref 3.5–5.1)
SODIUM: 139 mmol/L (ref 135–145)

## 2015-12-11 LAB — CBC
HCT: 33.6 % — ABNORMAL LOW (ref 39.0–52.0)
HEMOGLOBIN: 11 g/dL — AB (ref 13.0–17.0)
MCH: 26.8 pg (ref 26.0–34.0)
MCHC: 32.7 g/dL (ref 30.0–36.0)
MCV: 82 fL (ref 78.0–100.0)
Platelets: 297 10*3/uL (ref 150–400)
RBC: 4.1 MIL/uL — AB (ref 4.22–5.81)
RDW: 13.8 % (ref 11.5–15.5)
WBC: 6 10*3/uL (ref 4.0–10.5)

## 2015-12-11 LAB — GLUCOSE, CAPILLARY
Glucose-Capillary: 198 mg/dL — ABNORMAL HIGH (ref 65–99)
Glucose-Capillary: 258 mg/dL — ABNORMAL HIGH (ref 65–99)

## 2015-12-11 MED ORDER — ISOSORBIDE MONONITRATE ER 30 MG PO TB24
30.0000 mg | ORAL_TABLET | Freq: Every day | ORAL | Status: DC
  Administered 2015-12-11: 30 mg via ORAL
  Filled 2015-12-11: qty 1

## 2015-12-11 MED ORDER — AMLODIPINE BESYLATE 10 MG PO TABS: 10.0000 mg | ORAL_TABLET | Freq: Every day | ORAL | Status: DC

## 2015-12-11 MED ORDER — POTASSIUM CHLORIDE CRYS ER 20 MEQ PO TBCR
40.0000 meq | EXTENDED_RELEASE_TABLET | Freq: Once | ORAL | Status: AC
  Administered 2015-12-11: 40 meq via ORAL
  Filled 2015-12-11: qty 2

## 2015-12-11 MED ORDER — ASPIRIN 81 MG PO TBEC: 81.0000 mg | DELAYED_RELEASE_TABLET | Freq: Every day | ORAL | Status: AC

## 2015-12-11 MED ORDER — PANTOPRAZOLE SODIUM 40 MG PO TBEC: 40.0000 mg | DELAYED_RELEASE_TABLET | Freq: Every day | ORAL | Status: DC

## 2015-12-11 MED ORDER — ISOSORBIDE MONONITRATE ER 30 MG PO TB24
30.0000 mg | ORAL_TABLET | Freq: Every day | ORAL | Status: DC
Start: 2015-12-11 — End: 2018-04-15

## 2015-12-11 MED ORDER — ATORVASTATIN CALCIUM 80 MG PO TABS: 80.0000 mg | ORAL_TABLET | Freq: Every day | ORAL | Status: DC

## 2015-12-11 MED ORDER — NITROGLYCERIN 0.4 MG SL SUBL: 0.4000 mg | SUBLINGUAL_TABLET | SUBLINGUAL | Status: DC | PRN

## 2015-12-11 NOTE — Plan of Care (Signed)
Problem: Consults Goal: Skin Care Protocol Initiated - if Braden Score 18 or less If consults are not indicated, leave blank or document N/A  Outcome: Not Applicable Date Met:  94/32/76 braden > 18 Goal: Tobacco Cessation referral if indicated Outcome: Not Applicable Date Met:  14/70/92 Pt does not smoke

## 2015-12-11 NOTE — Progress Notes (Signed)
    Subjective:  Denies CP or dyspnea   Objective:  Filed Vitals:   12/10/15 2020 12/10/15 2342 12/11/15 0418 12/11/15 0824  BP: 156/71 141/64 146/66 133/66  Pulse: 72 73 71 70  Temp: 98 F (36.7 C) 98.2 F (36.8 C) 98.2 F (36.8 C)   TempSrc: Oral Oral Oral   Resp: 20 20 20    Height:      Weight:   227 lb 11.2 oz (103.284 kg)   SpO2: 100% 95% 98%     Intake/Output from previous day:  Intake/Output Summary (Last 24 hours) at 12/11/15 1011 Last data filed at 12/11/15 0525  Gross per 24 hour  Intake   1210 ml  Output    525 ml  Net    685 ml    Physical Exam: Physical exam: Well-developed obese in no acute distress.  Skin is warm and dry.  HEENT is normal.  Neck is supple.  Chest is clear to auscultation with normal expansion.  Cardiovascular exam is regular rate and rhythm.  Abdominal exam nontender or distended. No masses palpated. Right groin with no hematoma and no bruit Extremities show no edema. neuro grossly intact    Lab Results: Basic Metabolic Panel:  Recent Labs  08/65/7806/19/17 1224 12/10/15 0402 12/10/15 1126 12/11/15 0430  NA  --  139  --  139  K  --  4.0  --  3.2*  CL  --  104  --  103  CO2  --  26  --  27  GLUCOSE  --  264*  --  202*  BUN  --  17  --  11  CREATININE  --  1.51* 1.41* 1.16  CALCIUM  --  9.2  --  9.1  MG 2.0  --   --   --    CBC:  Recent Labs  12/09/15 0944 12/10/15 1126 12/11/15 0430  WBC 5.6 6.4 6.0  NEUTROABS 2.5  --   --   HGB 12.2* 11.2* 11.0*  HCT 37.9* 34.5* 33.6*  MCV 82.2 84.1 82.0  PLT 327 275 297   Cardiac Enzymes:  Recent Labs  12/09/15 1547 12/09/15 2048 12/10/15 0402  TROPONINI 0.03 <0.03 0.03     Assessment/Plan:  1 UA-Cath results noted; plan medical therapy. Continue ASA, statin and norvasc. Has had headache with nitroglycerin in past. Will try imdur 30 mg daily to see if he tolerates. 2 abdominal pain-etiology unclear. CT scan unremarkable. Liver functions normal. Symptoms resolved. FU  primary care as outpt. 3 Acute kidney disease-Cr improved; continue off HCTZ 4 Diabetes mellitus-continue preadmission medications but hold Glucophage for 48 hours following catheterization. 5 hypertension-continue preadmission medications. DC today and Fu with Dr Antoine PocheHochrein 2-4 weeks > 30 min PA and physician time D2 Olga MillersBrian Kenyana Husak 12/11/2015, 10:11 AM

## 2015-12-11 NOTE — Discharge Summary (Signed)
Discharge Summary    Patient ID: Mark Fernandez,  MRN: [id number], DOB/AGE: March 23, 1951 65 y.o.  Admit date: 12/09/2015 Discharge date: 12/11/2015  Primary Care Provider: Laurena Fernandez Primary Cardiologist: Dr. Antoine Fernandez  Discharge Diagnoses    Principal Problem:   Chest pain Active Problems:   Hypertension   Coronary artery disease   Cardiomyopathy [facility])   Hypercholesteremia   Abdominal pain   Diabetes (HCC)   NSTEMI (non-ST elevated myocardial infarction) [facility])   Allergies No Known Allergies  Diagnostic Studies/Procedures    LHC 12/10/2015   Conclusion     Ost LAD to Prox LAD lesion, 70% stenosed.  Mid RCA lesion, 100% stenosed.  2nd Mrg lesion, 99% stenosed.  Lat 1st Mrg lesion, 99% stenosed.  1st Mrg lesion, 80% stenosed.  LIMA .  Dist LAD lesion, 80% stenosed.  Mid Cx lesion, 80% stenosed.  Prox RCA lesion, 30% stenosed.  SVG .  Prox Graft lesion, 40% stenosed.  Ost RPDA to RPDA lesion, 95% stenosed.  SVG .  Origin lesion, 100% stenosed.  SVG .  The LIMA graft was widely patent and anastomosed into the mid LAD. The LAD beyond the anastomosis was small caliber. There was diffuse narrowing of 80% in the mid distal LAD segment.  The SVG to the distal RCA was a large graft. There was 40% smooth eccentric proximal stenosis focally. The graft anastomosing to the distal RCA. The first PDA branch was diffusely narrowed 99%. The distal RCA and is in a small posterolateral branch.  The SVG to the diagonal vessel was totally occluded at its origin.  The SVG supplying the marginal vessel was widely patent. There was narrowing in a branch of the marginal after the anastomosis of about 70%.   Severe multi-vessel native LAD disease with diffuse 70% proximal LAD stenosis; 90 and 99% stenoses in the OM1 and OM 2 branches of the circumflex coronary artery with 80% mid AV groove circumflex stenosis after the takeoff of the second marginal branch; 30%  proximal in total occlusion of the mid RCA after an RV marginal branch.  Patent LIMA graft supplying the mid LAD but with diffusely diseased small caliber LAD with narrowing of 80% in the mid distal segment.  Patent SVG to circumflex marginal branch.  Occluded SVG which previously supplied the diagonal vessel.  Pain SVG to the distal RCA, but with 40% smooth eccentric focal proximal stenosis in the proximal segment of the graft and severe diffuse subtotal PDA stenosis in the native RCA beyond the anastomosis.  LVEDP 20 mmHg.  RECOMMENDATION: Increased medical therapy. The patient is already on amlodipine as well as carvedilol. With diffuse native CAD consider the addition of Ranexa and oral nitrates. A 2-D echo Doppler study will be ordered to assess LV function. The patient will be hydrated post procedure with his underlying renal insufficiency.   Diagnostic Diagram         2-D echo: 12/10/2015  Study Conclusions  - Left ventricle: The cavity size was normal. There was mild  concentric hypertrophy. Systolic function was normal. The  estimated ejection fraction was in the range of 50% to 55%. Wall  motion was normal; there were no regional wall motion  abnormalities. Doppler parameters are consistent with abnormal  left ventricular relaxation (grade 1 diastolic dysfunction).  Doppler parameters are consistent with elevated ventricular  end-diastolic filling pressure. - Aortic valve: Transvalvular velocity was within the normal range.  There was no stenosis. There was no regurgitation. - Mitral valve:  Calcified annulus. Transvalvular velocity was  within the normal range. There was no evidence for stenosis.  There was trivial regurgitation. - Left atrium: The atrium was moderately dilated. - Right ventricle: The cavity size was normal. Wall thickness was  normal. Systolic function was normal. - Atrial septum: No defect or patent foramen ovale was identified   by color flow Doppler. - Tricuspid valve: There was trivial regurgitation. - Pulmonary arteries: Systolic pressure was within the normal  range. _____________   History of Present Illness     Mr. Mark Fernandez is an 65 yo male with hx of CAD-2007 status post CABG X 4 (left internal mammary artery to left anterior descending, saphenous vein graft to diagonal, saphenous vein graft to obtuse marginal, saphenous vein graft to right coronary artery) EF 45%, SVT HDL, DM-2, HTN. Last nuc 12/2014 with EF 42%, sm. Defect of mild severity anteroseptal and anteroapical. Last cath prior to CABG. He presented to his PCP on 6/19/92017 for routine visit but then developed pain in chest (dull pressure) and on arrival to Dr. Ophelia Charter office and was diaphoretic, SOB and nauseated. 2 NTG helped chest pain. EKG with ST ? Changes in ant leads but it was similar to previous EKG. He also has abd pain. His troponin is 3.04. He had stopped his atorvastatin a week ago due to muscle cramps.EKG with SR ST depression inf lat leads. K+ 3.4 Cr 1.64, troponin 3.04 T chol 213, TG 207, HDL 25, LDL 147,  CXR with Ill-defined reticular nodular opacities bilaterally on admission. Did receive a CT scan of the abdomen in the ER which was unremarkable.   Hospital Course     Consult: Diabetes Coordinater  He was admitted from the [facility] ED and started on IV heparin drip. As above his initial troponin resulted at 3.04 but cycled x3 and negative there after. He was taken for a left heart cath on 12/10/2015 with Dr. Tresa Endo showing Severe multivessel native LAD disease with diffuse 70% proximal LAD stenosis, stenosis in the OM1 and OM 2 branches of circumflex artery with 80% mid AV groove circumflex stenosis, 30% proximal total occlusion of mid RCA after marginal branch. Did have patent LIMA graft supplying mid LAD but with diffusely diseased small caliber LAD with narrowing of 80%. Did have patent SVG to circumflex, along with patent SVG  to distal RCA but 40% proximal stenosis in the proximal segment of the graft. LVEDP 20 mmHg. It was recommended that we increase medical therapy, with consideration of addition of Ranexa and oral nitrates. He did undergo a 2-D echo showing stable EF of 50-55%, with no regional wall motion abnormality, and grade 1 diastolic dysfunction. He was observed overnight and hydrated post procedure given his underlying renal insufficiency. His creatinine post cath was 1.16, along with stable hemoglobin of 11. Plans to continue medical therapy with aspirin statin Norvasc and addition of Imdur 30 mg daily. Of note he has not tolerated nitroglycerin well in the past and had headaches, will need to follow-up regarding patient's tolerance of Imdur. CT scan of the abdomen as noted above was unremarkable and had normal liver function test, plans to follow-up with primary care as outpatient regarding this. His HC TZ was held during this admission and is not scheduled to be resumed on discharge. Have instructed the patient to hold his metformin for 48 hours following this catheterization. He was seen and assessed by Dr. Jens Som on 12/11/2015 and determined stable for discharge.  I have arranged for follow-up in  the office in 2 weeks.  _____________  Discharge Vitals Blood pressure 133/66, pulse 70, temperature 98.2 F (36.8 C), temperature source Oral, resp. rate 20, height 5\' 4"  (1.626 m), weight 227 lb 11.2 oz (103.284 kg), SpO2 98 %.  Filed Weights   12/09/15 1800 12/10/15 0532 12/11/15 0418  Weight: 228 lb 6.3 oz (103.6 kg) 225 lb 4.8 oz (102.195 kg) 227 lb 11.2 oz (103.284 kg)    Labs & Radiologic Studies    CBC  Recent Labs  12/09/15 0944 12/10/15 1126 12/11/15 0430  WBC 5.6 6.4 6.0  NEUTROABS 2.5  --   --   HGB 12.2* 11.2* 11.0*  HCT 37.9* 34.5* 33.6*  MCV 82.2 84.1 82.0  PLT 327 275 297   Basic Metabolic Panel  Recent Labs  12/09/15 1224 12/10/15 0402 12/10/15 1126 12/11/15 0430  NA  --   139  --  139  K  --  4.0  --  3.2*  CL  --  104  --  103  CO2  --  26  --  27  GLUCOSE  --  264*  --  202*  BUN  --  17  --  11  CREATININE  --  1.51* 1.41* 1.16  CALCIUM  --  9.2  --  9.1  MG 2.0  --   --   --    Liver Function Tests  Recent Labs  12/09/15 0944  AST 31  ALT 23  ALKPHOS 60  BILITOT 0.9  PROT 7.6  ALBUMIN 3.5    Recent Labs  12/09/15 0940  LIPASE 26   Cardiac Enzymes  Recent Labs  12/09/15 1547 12/09/15 2048 12/10/15 0402  TROPONINI 0.03 <0.03 0.03   Hemoglobin A1C  Recent Labs  12/09/15 1224  HGBA1C 10.9*   Fasting Lipid Panel  Recent Labs  12/09/15 0949  CHOL 213*  HDL 25*  LDLCALC 147*  TRIG 207*  CHOLHDL 8.5   Thyroid Function Tests  Recent Labs  12/09/15 1224  TSH 1.117   _____________  Ct Abdomen Pelvis W Contrast  12/09/2015  CLINICAL DATA:  65 year old male with a history of abdominal pain EXAM: CT ABDOMEN AND PELVIS WITH CONTRAST TECHNIQUE: Multidetector CT imaging of the abdomen and pelvis was performed using the standard protocol following bolus administration of intravenous contrast. CONTRAST:  1 ISOVUE-300 IOPAMIDOL (ISOVUE-300) INJECTION 61% COMPARISON:  CT 01/10/2009 FINDINGS: Lower chest: Surgical changes median sternotomy. Heart size within normal limits.  No pericardial fluid/thickening. Calcifications in the distribution the native coronary arteries. No lower mediastinal adenopathy. Unremarkable appearance of the distal esophagus. No hiatal hernia. No confluent airspace disease, pleural fluid, or pneumothorax within visualized lung. Atelectasis/scarring at the lung bases. Abdomen/pelvis: Unremarkable appearance of liver and spleen. Unremarkable appearance of bilateral adrenal glands. Unremarkable pancreas. Cholecystectomy No intrahepatic or extrahepatic biliary ductal dilatation. No intra-peritoneal free air or significant free-fluid. No abnormally dilated small bowel or colon. No transition point. No inflammatory  changes of the mesenteries. Normal appendix identified. Colonic diverticular disease.  No associated inflammatory changes. Right Kidney/Ureter: No hydronephrosis. Punctate calcifications in the region the collecting system. Small hypodense lesion on the posterior cortex of the right kidney, too small to completely characterize. Unremarkable course of the right ureter. Left Kidney/Ureter: Nonobstructive stone measures 9 mm at the posterior collecting system of the right kidney, stone actually measures smaller than the comparison CT. No hydronephrosis. Small hypodensities on lateral cortex the left kidney, too small to characterize. Unremarkable course of the left ureter. Unremarkable  appearance of the urinary bladder. Unremarkable appearance of the prostate. Atherosclerotic calcifications.  No aneurysm or periaortic fluid. Musculoskeletal: No displaced fracture identified. No significant degenerative changes of the spine. IMPRESSION: No acute CT finding to account for the patient's symptoms of abdominal pain. Bilateral nonobstructive nephrolithiasis. Diverticular disease without evidence of acute diverticulitis. Signed, Yvone NeuJaime S. Loreta AveWagner, DO [facility] Electronically Signed   By: Gilmer MorJaime  Wagner D.O.   On: 12/09/2015 12:03   Dg Chest Port 1 View  12/09/2015  CLINICAL DATA:  65 year old male with a history of chest pain EXAM: PORTABLE CHEST 1 VIEW COMPARISON:  11/21/2007 FINDINGS: Cardiomediastinal silhouette unchanged. Surgical changes of median sternotomy and CABG. Ill-defined reticulonodular opacity bilateral lungs. No pneumothorax. No large confluent airspace disease. No large pleural effusion. No displaced fracture. IMPRESSION: Ill-defined reticular nodular opacities bilaterally. This is nonspecific, potentially secondary to atelectasis given the lung volumes, however, acute pneumonitis for atypical infection could have this appearance. Surgical changes  of median sternotomy and CABG. Signed, Yvone NeuJaime S. Loreta AveWagner, DO [facility] Electronically Signed   By: Gilmer MorJaime  Wagner D.O.   On: 12/09/2015 10:02   Disposition   Pt is being discharged home today in good condition.  Follow-up Plans & Appointments    Follow-up Information    Follow up with Theodore DemarkBarrett, Rhonda, PA-C On 12/26/2015.   Specialties:  Cardiology, Radiology   Why:  8am for your hospital follow up appt.   Contact information:   [address] [place] [address] [phone]      Discharge Instructions    Diet - low sodium heart healthy    Complete by:  As directed      Discharge instructions    Complete by:  As directed   Groin Site Care Refer to this sheet in the next few weeks. These instructions provide you with information on caring for yourself after your procedure. Your caregiver may also give you more specific instructions. Your treatment has been planned according to current medical practices, but problems sometimes occur. Call your caregiver if you have any problems or questions after your procedure. HOME CARE INSTRUCTIONS You may shower 24 hours after the procedure. Remove the bandage (dressing) and gently wash the site with plain soap and water. Gently pat the site dry.  Do not apply powder or lotion to the site.  Do not sit in a bathtub, swimming pool, or whirlpool for 5 to 7 days.  No bending, squatting, or lifting anything over 10 pounds (4.5 kg) as directed by your caregiver.  Inspect the site at least twice daily.  Do not drive home if you are discharged the same day of the procedure. Have someone else drive you.  You may drive 24 hours after the procedure unless otherwise instructed by your caregiver.  What to expect: Any bruising will usually fade within 1 to 2 weeks.  Blood that collects in the tissue (hematoma) may be painful to the touch. It should usually decrease in size and tenderness  within 1 to 2 weeks.  SEEK IMMEDIATE MEDICAL CARE IF: You have unusual pain at the groin site or down the affected leg.  You have redness, warmth, swelling, or pain at the groin site.  You have drainage (other than a small amount of blood on the dressing).  You have chills.  You have a fever or persistent symptoms for more than 72 hours.  You have a fever and your symptoms suddenly get worse.  Your leg becomes pale, cool, tingly, or numb.  You have heavy bleeding from the site. Hold pressure on the site. .     Increase activity slowly    Complete by:  As directed            Discharge Medications   Current Discharge Medication List    START taking these medications   Details  amLODipine (NORVASC) 10 MG tablet Take 1 tablet (10 mg total) by mouth daily. Qty: 30 tablet, Refills: 11    aspirin EC 81 MG EC tablet Take 1 tablet (81 mg total) by mouth daily.    atorvastatin (LIPITOR) 80 MG tablet Take 1 tablet (80 mg total) by mouth daily at 6 PM. Qty: 30 tablet, Refills: 11    isosorbide mononitrate (IMDUR) 30 MG 24 hr tablet Take 1 tablet (30 mg total) by mouth daily. Qty: 30 tablet, Refills: 11    nitroGLYCERIN (NITROSTAT) 0.4 MG SL tablet Place 1 tablet (0.4 mg total) under the tongue every 5 (five) minutes x 3 doses as needed for chest pain. Qty: 25 tablet, Refills: 2    pantoprazole (PROTONIX) 40 MG tablet Take 1 tablet (40 mg total) by mouth daily. Qty: 30 tablet, Refills: 11      CONTINUE these medications which have NOT CHANGED   Details  carvedilol (COREG) 12.5 MG tablet take 1 tablet by mouth twice a day with meals Qty: 180 tablet, Refills: 1    diclofenac (VOLTAREN) 75 MG EC tablet Take 75 mg by mouth 2 (two) times daily as needed (pain).     HUMULIN 70/30 (70-30) 100 UNIT/ML injection Inject 100 Units into the skin 2 (two) times daily. Refills: 0    metFORMIN (GLUMETZA) 500 MG (MOD) 24 hr tablet Take 500 mg by mouth 2 (two) times daily with a meal.        Omega-3 Fatty Acids (FISH OIL) 1000 MG CAPS Take 1 capsule by mouth 2 (two) times daily.      vitamin B-12 (CYANOCOBALAMIN) 500 MCG tablet Take 500 mcg by mouth 2 (two) times daily.        STOP taking these medications     aspirin 325 MG tablet      hydrochlorothiazide (MICROZIDE) 12.5 MG capsule      omeprazole (PRILOSEC) 20 MG capsule          Aspirin prescribed at discharge?  Yes High Intensity Statin Prescribed? (Lipitor 40-80mg  or Crestor 20-40mg ): Yes Beta Blocker Prescribed? Yes For EF <40%, was ACEI/ARB Prescribed? No:  ADP Receptor Inhibitor Prescribed? (i.e. Plavix etc.-Includes Medically Managed Patients): No: No Intervention For EF <40%, Aldosterone Inhibitor Prescribed? No Was EF assessed during THIS hospitalization? Yes Was Cardiac Rehab II ordered? (Included Medically managed Patients): No:    Outstanding Labs/Studies   Follow up BMET in the office.  Duration of Discharge Encounter   Greater than 30 minutes including physician time.  Signed, Laverda Page NP-C 12/11/2015, 11:46 AM

## 2015-12-11 NOTE — Care Management Important Message (Signed)
Important Message  Patient Details  Name: Mark Fernandez MRN: [id number] Date of Birth: 30-Nov-1950   Medicare Important Message Given:  Yes    Bernadette HoitShoffner, Benisha Hadaway Coleman 12/11/2015, 1:49 PM

## 2015-12-11 NOTE — Progress Notes (Signed)
Pt given discharge instructions. Pt informed & educated on medications, follow-up appointments, when to call the doctor & etc. CCMD notified of pt being d/c'd prior to telemetry removal. Pt's IV was removed. Sanda LingerMilam, Teyanna Thielman R, RN

## 2015-12-26 ENCOUNTER — Telehealth (HOSPITAL_COMMUNITY): Payer: Self-pay | Admitting: *Deleted

## 2015-12-26 ENCOUNTER — Ambulatory Visit (INDEPENDENT_AMBULATORY_CARE_PROVIDER_SITE_OTHER): Payer: Medicare Other | Admitting: Physician Assistant

## 2015-12-26 ENCOUNTER — Encounter: Payer: Self-pay | Admitting: Physician Assistant

## 2015-12-26 VITALS — BP 120/68 | HR 90 | Ht 64.0 in | Wt 220.2 lb

## 2015-12-26 DIAGNOSIS — I214 Non-ST elevation (NSTEMI) myocardial infarction: Secondary | ICD-10-CM

## 2015-12-26 DIAGNOSIS — E7800 Pure hypercholesterolemia, unspecified: Secondary | ICD-10-CM | POA: Diagnosis not present

## 2015-12-26 DIAGNOSIS — Z79899 Other long term (current) drug therapy: Secondary | ICD-10-CM | POA: Diagnosis not present

## 2015-12-26 NOTE — Telephone Encounter (Signed)
Received cardiac rehab maintenance order/ non telemtry.  Reviewed medical record. Pt with recent NSTEMI 6/17.  Pt eligible to attend phase II cardiac rehab/telemetry.  Order sent to Dr. Antoine PocheHochrein for signature. Alanson Alyarlette Myles Tavella RN, BSN

## 2015-12-26 NOTE — Patient Instructions (Addendum)
Your physician recommends that you continue on your current medications as directed. Please refer to the Current Medication list given to you today.  [facility]  Your physician recommends that you schedule a follow-up appointment in: THREE MONTHS with Dr. Antoine PocheHochrein   Your oxygen levels while walking were 98% - please monitor your activity level

## 2015-12-26 NOTE — Progress Notes (Signed)
Cardiology Office Note   Date:  12/26/2015    ID:  Mark Fernandez, DOB 13-Mar-1951, MRN [id number]  PCP:  Laurena Slimmer, MD  Cardiologist:  Dr Luna Kitchens, PA-C   Chief Complaint  Patient presents with  . Hospitalization Follow-up    History of Present Illness: Mark Fernandez is a 65 y.o. male with a history of CABG 2007 (LIMA-LAD, SVG-D1, SVG-OM, SVG-RCA), EF 45%, NSVT, SVT, DM, HTN, HLD, remote tob use.  D/C 06/21 after admit for NSTEMI, cath w/ diffuse distal dz, SVG-D1 100%, other grafts patent, med rx, EF 50-55% by echo  Mark Fernandez presents for Post hospital follow-up.  Since discharge from the hospital, he has struggled a bit. The day after his discharge from the hospital for his non-STEMI, he worked outside in the heat for several hours. He was exhausted after that. The next 2 days, he was extremely weak and tired and had to get family help with things. He does not think he was drinking enough water. He has not been trying to walk for exercise. He occasionally gets leg cramps.  He did not have chest pain. He states he has some chronic dyspnea on exertion that has not changed recently. He denies lower extremity swelling, orthopnea, or PND.  He is tolerating his medications well. He wants to keep his activity level up, wants to stay busy.   Past Medical History  Diagnosis Date  . Arrhythmia     NSVT  . Diabetes mellitus     type II  . Hypertension   . Hypercholesteremia   . History of tobacco use   . Coronary artery disease     2007 status post CABG X 4  (left internal mammary artery to left anterior descending, saphenous vein graft to diagonal, saphenous  vein graft to obtuse marginal, saphenous vein graft to right coronary   artery).  . SVT (supraventricular tachycardia) (HCC)   . Cardiomyopathy     EF 45%  . Bell's palsy     Past Surgical History  Procedure Laterality Date  . Cholecystectomy    . Lithotripsy    . Coronary artery  bypass graft    . Cataract extraction, bilateral    . Cardiac catheterization N/A 12/10/2015    Procedure: Left Heart Cath and Cors/Grafts Angiography;  Surgeon: Lennette Bihari, MD;  Location: [facility] INVAS[facility] CV [facility];  Service: Cardiovascular;  Laterality: N/A;    Current Outpatient Prescriptions  Medication Sig Dispense Refill  . amLODipine (NORVASC) 10 MG tablet Take 1 tablet (10 mg total) by mouth daily. 30 tablet 11  . aspirin EC 81 MG EC tablet Take 1 tablet (81 mg total) by mouth daily.    Marland Kitchen atorvastatin (LIPITOR) 80 MG tablet Take 1 tablet (80 mg total) by mouth daily at 6 PM. 30 tablet 11  . carvedilol (COREG) 12.5 MG tablet take 1 tablet by mouth twice a day with meals 180 tablet 1  . HUMULIN 70/30 (70-30) 100 UNIT/ML injection Inject 100 Units into the skin 2 (two) times daily.  0  . isosorbide mononitrate (IMDUR) 30 MG 24 hr tablet Take 1 tablet (30 mg total) by mouth daily. 30 tablet 11  . nitroGLYCERIN (NITROSTAT) 0.4 MG SL tablet Place 1 tablet (0.4 mg total) under the tongue every 5 (five) minutes x 3 doses as needed for chest pain. 25 tablet 2  . pantoprazole (PROTONIX) 40 MG tablet Take 1 tablet (40 mg total) by mouth daily.  30 tablet 11   No current facility-administered medications for this visit.    Allergies:   Review of patient's allergies indicates no known allergies.    Social History:  The patient  reports that he quit smoking about 12 years ago. He does not have any smokeless tobacco history on file. He reports that he drinks alcohol. He reports that he does not use illicit drugs.   Family History:  The patient's family history includes Diabetes in his mother; Hypertension in his mother.    ROS:  Please see the history of present illness. All other systems are reviewed and negative.    PHYSICAL EXAM: VS:  BP 120/68 mmHg  Pulse 90  Ht 5\' 4"  (1.626 m)  Wt 220 lb 3.2 oz (99.882 kg)  BMI 37.78 kg/m2  SpO2 98% , BMI Body mass index is 37.78 kg/(m^2). GEN: Well  nourished, well developed, male in no acute distress HEENT: normal for age  Neck: no JVD seen but difficult to assess 2nd body habitus, no carotid bruit, no masses Cardiac: RRR; no murmur, no rubs, or gallops Respiratory:  clear to auscultation bilaterally, normal work of breathing GI: soft, nontender, nondistended, + BS MS: no deformity or atrophy; no edema; distal pulses are 2+ in all 4 extremities  Skin: warm and dry, no rash Neuro:  Strength and sensation are intact Psych: euthymic mood, full affect   EKG:  EKG is ordered today. The ekg ordered today demonstrates sinus rhythm, rate 90, nonspecific ST abnormality, but T waves are larger than they were on 06/21. No pathologic Q waves are noted.   Recent Labs: 12/09/2015: ALT 23; Magnesium 2.0; TSH 1.117 12/11/2015: BUN 11; Creatinine, Ser 1.16; Hemoglobin 11.0*; Platelets 297; Potassium 3.2*; Sodium 139    Lipid Panel    Component Value Date/Time   CHOL 213* 12/09/2015 0949   TRIG 207* 12/09/2015 0949   HDL 25* 12/09/2015 0949   CHOLHDL 8.5 12/09/2015 0949   VLDL 41* 12/09/2015 0949   LDLCALC 147* 12/09/2015 0949     Wt Readings from Last 3 Encounters:  12/26/15 220 lb 3.2 oz (99.882 kg)  12/11/15 227 lb 11.2 oz (103.284 kg)  03/25/15 228 lb 6.4 oz (103.602 kg)     Other studies Reviewed: Additional studies/ records that were reviewed today include: Office notes, hospital records and testing.  ASSESSMENT AND PLAN:  1.  Non-STEMI: I explained to him that he was undoubtedly trying to increase his activity to much too soon. I encouraged him to attend cardiac rehabilitation, we will make the referral. He is reluctant to attend because he does not want to give up his activity level. I explained to him that attending cardiac rehabilitation would help him increase his activity safely that will keep him going longer.  Compliance with medications was encouraged, he is tolerating all of his medications well.  2. Dyspnea on  exertion: He ambulated around the office and his oxygen saturation remained 98% on room air. I encouraged him to pace himself and if he noticed dyspnea on exertion, or slow down a little bit. He has no signs or symptoms of volume overload by exam.  3. Weakness and dizziness after exertion: I'm concerned that he is not drinking enough water. He is encouraged to stay hydrated when he is working outside. It is okay to eat something with a little salt in it if he is sweating a lot. We will check a BMET today to make sure his electrolytes are okay.  4. Hyperlipidemia:  The patient and previously taken himself off his statin because of leg pain. He may be having leg cramps in his electrolytes may be a little off, we are checking. He was started on Lipitor 80 mg daily during his recent hospitalization. Continue this. Check lipid and liver at his next follow-up appointment.  Current medicines are reviewed at length with the patient today.  The patient does not have concerns regarding medicines.  The following changes have been made:  no change  Labs/ tests ordered today include:   Orders Placed This Encounter  Procedures  . Basic metabolic panel  . AMB referral to cardiac rehabilitation  . EKG 12-Lead     Disposition:   FU with Dr Antoine PocheHochrein  Signed, Theodore DemarkBarrett, Stark Aguinaga, PA-C  12/26/2015 8:40 AM    [facility] Phone: [phone]; Fax: [phone]  This note was written with the assistance of speech recognition software. Please excuse any transcriptional errors.

## 2016-01-23 ENCOUNTER — Telehealth (HOSPITAL_COMMUNITY): Payer: Self-pay | Admitting: *Deleted

## 2016-01-23 NOTE — Telephone Encounter (Signed)
Received signed order from Hochrein.  Attempted to call both home and cell phone number listed in epic. Phone continues to ring then stops.  Pt sent letter to please contact cardiac rehab. Alanson Aly, BSN

## 2016-11-10 ENCOUNTER — Other Ambulatory Visit: Payer: Self-pay | Admitting: Internal Medicine

## 2016-11-10 DIAGNOSIS — M5412 Radiculopathy, cervical region: Secondary | ICD-10-CM

## 2016-11-20 ENCOUNTER — Ambulatory Visit (HOSPITAL_COMMUNITY): Payer: Medicare Other

## 2016-11-20 ENCOUNTER — Ambulatory Visit (HOSPITAL_COMMUNITY)
Admission: RE | Admit: 2016-11-20 | Discharge: 2016-11-20 | Disposition: A | Discharge status: Home / Self Care | Payer: Medicare Other | Source: Ambulatory Visit | Attending: Internal Medicine | Admitting: Internal Medicine

## 2016-11-20 DIAGNOSIS — M542 Cervicalgia: Secondary | ICD-10-CM | POA: Insufficient documentation

## 2016-11-20 DIAGNOSIS — M50223 Other cervical disc displacement at C6-C7 level: Secondary | ICD-10-CM | POA: Diagnosis not present

## 2016-11-20 DIAGNOSIS — M5030 Other cervical disc degeneration, unspecified cervical region: Secondary | ICD-10-CM | POA: Insufficient documentation

## 2016-11-20 DIAGNOSIS — M5412 Radiculopathy, cervical region: Secondary | ICD-10-CM

## 2016-11-20 LAB — POCT I-STAT CREATININE: Creatinine, Ser: 1.3 mg/dL — ABNORMAL HIGH (ref 0.61–1.24)

## 2016-11-20 MED ORDER — GADOBENATE DIMEGLUMINE 529 MG/ML IV SOLN
20.0000 mL | Freq: Once | INTRAVENOUS | Status: AC | PRN
  Administered 2016-11-20: 20 mL via INTRAVENOUS

## 2016-12-31 ENCOUNTER — Other Ambulatory Visit: Payer: Self-pay | Admitting: Neurosurgery

## 2016-12-31 DIAGNOSIS — M5412 Radiculopathy, cervical region: Secondary | ICD-10-CM

## 2017-06-22 ENCOUNTER — Emergency Department (HOSPITAL_COMMUNITY)
Admission: EM | Admit: 2017-06-22 | Discharge: 2017-06-22 | Disposition: A | Discharge status: Home / Self Care | Payer: Medicare Other | Attending: Emergency Medicine | Admitting: Emergency Medicine

## 2017-06-22 ENCOUNTER — Other Ambulatory Visit: Payer: Self-pay

## 2017-06-22 ENCOUNTER — Encounter (HOSPITAL_COMMUNITY): Payer: Self-pay

## 2017-06-22 DIAGNOSIS — H6123 Impacted cerumen, bilateral: Secondary | ICD-10-CM | POA: Insufficient documentation

## 2017-06-22 DIAGNOSIS — Z87891 Personal history of nicotine dependence: Secondary | ICD-10-CM | POA: Insufficient documentation

## 2017-06-22 DIAGNOSIS — N3001 Acute cystitis with hematuria: Secondary | ICD-10-CM | POA: Insufficient documentation

## 2017-06-22 DIAGNOSIS — Z794 Long term (current) use of insulin: Secondary | ICD-10-CM | POA: Diagnosis not present

## 2017-06-22 DIAGNOSIS — R42 Dizziness and giddiness: Secondary | ICD-10-CM | POA: Diagnosis present

## 2017-06-22 DIAGNOSIS — Z7982 Long term (current) use of aspirin: Secondary | ICD-10-CM | POA: Insufficient documentation

## 2017-06-22 DIAGNOSIS — Z951 Presence of aortocoronary bypass graft: Secondary | ICD-10-CM | POA: Diagnosis not present

## 2017-06-22 DIAGNOSIS — I2510 Atherosclerotic heart disease of native coronary artery without angina pectoris: Secondary | ICD-10-CM | POA: Diagnosis not present

## 2017-06-22 DIAGNOSIS — R0602 Shortness of breath: Secondary | ICD-10-CM | POA: Insufficient documentation

## 2017-06-22 DIAGNOSIS — E876 Hypokalemia: Secondary | ICD-10-CM | POA: Diagnosis not present

## 2017-06-22 DIAGNOSIS — I252 Old myocardial infarction: Secondary | ICD-10-CM | POA: Diagnosis not present

## 2017-06-22 DIAGNOSIS — R110 Nausea: Secondary | ICD-10-CM | POA: Diagnosis not present

## 2017-06-22 DIAGNOSIS — E119 Type 2 diabetes mellitus without complications: Secondary | ICD-10-CM | POA: Insufficient documentation

## 2017-06-22 DIAGNOSIS — I10 Essential (primary) hypertension: Secondary | ICD-10-CM | POA: Insufficient documentation

## 2017-06-22 LAB — CBC
HEMATOCRIT: 28.4 % — AB (ref 39.0–52.0)
HEMOGLOBIN: 9.6 g/dL — AB (ref 13.0–17.0)
MCH: 28.2 pg (ref 26.0–34.0)
MCHC: 33.8 g/dL (ref 30.0–36.0)
MCV: 83.5 fL (ref 78.0–100.0)
Platelets: 323 10*3/uL (ref 150–400)
RBC: 3.4 MIL/uL — ABNORMAL LOW (ref 4.22–5.81)
RDW: 14.4 % (ref 11.5–15.5)
WBC: 5.3 10*3/uL (ref 4.0–10.5)

## 2017-06-22 LAB — URINALYSIS, ROUTINE W REFLEX MICROSCOPIC
Bilirubin Urine: NEGATIVE
Glucose, UA: >=500 mg/dL — AB
Ketones, ur: NEGATIVE mg/dL
Nitrite: NEGATIVE
PROTEIN: NEGATIVE mg/dL
Specific Gravity, Urine: 1.031 — ABNORMAL HIGH (ref 1.005–1.030)
Squamous Epithelial / LPF: NONE SEEN
pH: 5 (ref 5.0–8.0)

## 2017-06-22 LAB — BASIC METABOLIC PANEL
Anion gap: 3 — ABNORMAL LOW (ref 5–15)
BUN: 7 mg/dL (ref 6–20)
CHLORIDE: 117 mmol/L — AB (ref 101–111)
CO2: 20 mmol/L — AB (ref 22–32)
Calcium: 6.9 mg/dL — ABNORMAL LOW (ref 8.9–10.3)
Creatinine, Ser: 0.89 mg/dL (ref 0.61–1.24)
GFR calc Af Amer: >60 mL/min (ref >60)
GFR calc non Af Amer: >60 mL/min (ref >60)
GLUCOSE: 132 mg/dL — AB (ref 65–99)
POTASSIUM: 2.5 mmol/L — AB (ref 3.5–5.1)
Sodium: 140 mmol/L (ref 135–145)

## 2017-06-22 LAB — I-STAT CHEM 8, ED
BUN: 10 mg/dL (ref 6–20)
CHLORIDE: 106 mmol/L (ref 101–111)
Calcium, Ion: 1.14 mmol/L — ABNORMAL LOW (ref 1.15–1.40)
Creatinine, Ser: 1 mg/dL (ref 0.61–1.24)
Glucose, Bld: 124 mg/dL — ABNORMAL HIGH (ref 65–99)
HEMATOCRIT: 36 % — AB (ref 39.0–52.0)
HEMOGLOBIN: 12.2 g/dL — AB (ref 13.0–17.0)
POTASSIUM: 3.8 mmol/L (ref 3.5–5.1)
Sodium: 142 mmol/L (ref 135–145)
TCO2: 24 mmol/L (ref 22–32)

## 2017-06-22 LAB — CBG MONITORING, ED: GLUCOSE-CAPILLARY: 116 mg/dL — AB (ref 65–99)

## 2017-06-22 LAB — I-STAT TROPONIN, ED: Troponin i, poc: 0 ng/mL (ref 0.00–0.08)

## 2017-06-22 LAB — MAGNESIUM: MAGNESIUM: 1.8 mg/dL (ref 1.7–2.4)

## 2017-06-22 MED ORDER — SODIUM CHLORIDE 0.9 % IV BOLUS (SEPSIS)
1000.0000 mL | Freq: Once | INTRAVENOUS | Status: AC
  Administered 2017-06-22: 1000 mL via INTRAVENOUS

## 2017-06-22 MED ORDER — MAGNESIUM SULFATE IN D5W 1-5 GM/100ML-% IV SOLN
1.0000 g | Freq: Once | INTRAVENOUS | Status: AC
  Administered 2017-06-22: 1 g via INTRAVENOUS
  Filled 2017-06-22: qty 100

## 2017-06-22 MED ORDER — POTASSIUM CHLORIDE ER 10 MEQ PO TBCR: 40.0000 meq | EXTENDED_RELEASE_TABLET | Freq: Every day | ORAL | 0 refills | Status: DC

## 2017-06-22 MED ORDER — CEPHALEXIN 500 MG PO CAPS: 500.0000 mg | ORAL_CAPSULE | Freq: Four times a day (QID) | ORAL | 0 refills | Status: DC

## 2017-06-22 MED ORDER — POTASSIUM CHLORIDE CRYS ER 20 MEQ PO TBCR
40.0000 meq | EXTENDED_RELEASE_TABLET | Freq: Once | ORAL | Status: AC
  Administered 2017-06-22: 40 meq via ORAL
  Filled 2017-06-22: qty 2

## 2017-06-22 MED ORDER — POTASSIUM CHLORIDE 10 MEQ/100ML IV SOLN
10.0000 meq | Freq: Once | INTRAVENOUS | Status: AC
  Administered 2017-06-22: 10 meq via INTRAVENOUS
  Filled 2017-06-22: qty 100

## 2017-06-22 MED ORDER — POTASSIUM CHLORIDE ER 10 MEQ PO TBCR: 10.0000 meq | EXTENDED_RELEASE_TABLET | Freq: Every day | ORAL | 7 refills | Status: DC

## 2017-06-22 NOTE — ED Notes (Signed)
Critical potassium per lab of 2.5. MD notified.

## 2017-06-22 NOTE — ED Provider Notes (Signed)
[facility] Provider Note   CSN: [id number] Arrival date & time: 06/22/17  1554     History   Chief Complaint Chief Complaint  Patient presents with  . Dizziness    HPI Mark ConnJames C Hafner is a 67 y.o. male w/ h/o CAD s/p CABG x 4, EF 45%, SVT, DM on insulin, HTN, HLD, NSTEMI presents to ED for evaluation of "dizziness" x 1 week. Dizziness described as off balance, swimmy headed, room spinning and feeling like he is unsteady and about to fall. Vision gets blurred and then returns to baseline. Symptoms occur with walking only and improve slowly at rest. No symptoms with head movements or laying down, standing up. Was walking out of his house today and had sudden onset dizziness with walking, nausea and almost fell. Sat in bed and symptoms resolved in about 30 mins, previous episodes however have resolved within minutes. Reports long history of exertional shortness of breath with walking and doing things around house, this has no changed recently. Denies CP or new/worsening SOB, palpitations, headache, recent URI illness, earache. No recent vomiting, diarrhea, or decreased fluid intake. No previous h/o vertigo.   H/o CVA (1995), Bells Palsy with left sided drooping. Only recent med change includes discontinuation of PPI. Denies illicit drug use. Has been compliant with all medications.  HPI  Past Medical History:  Diagnosis Date  . Arrhythmia    NSVT  . Bell's palsy   . Cardiomyopathy    EF 45%  . Coronary artery disease    2007 status post CABG X 4  (left internal mammary artery to left anterior descending, saphenous vein graft to diagonal, saphenous  vein graft to obtuse marginal, saphenous vein graft to right coronary   artery).  . Diabetes mellitus    type II  . History of tobacco use   . Hypercholesteremia   . Hypertension   . SVT (supraventricular tachycardia) [facility](HCC)     Patient Active Problem List   Diagnosis Date Noted  . Chest pain  12/09/2015  . Abdominal pain 12/09/2015  . Diabetes (HCC) 12/09/2015  . NSTEMI (non-ST elevated myocardial infarction) (HCC) 12/09/2015  . Overweight(278.02) 02/10/2011  . Hypertension   . Coronary artery disease   . Cardiomyopathy (HCC)   . Hypercholesteremia   . Arrhythmia     Past Surgical History:  Procedure Laterality Date  . CARDIAC CATHETERIZATION N/A 12/10/2015   Procedure: Left Heart Cath and Cors/Grafts Angiography;  Surgeon: Lennette Biharihomas A Kelly, MD;  [facility];  Service: Cardiovascular;  Laterality: N/A;  . CATARACT EXTRACTION, BILATERAL    . CHOLECYSTECTOMY    . CORONARY ARTERY BYPASS GRAFT    . LITHOTRIPSY         Home Medications    Prior to Admission medications   Medication Sig Start Date End Date Taking? Authorizing Provider  amLODipine (NORVASC) 10 MG tablet Take 1 tablet (10 mg total) by mouth daily. 12/11/15  Yes Laverda Pageoberts, Lindsay B, NP  aspirin EC 81 MG EC tablet Take 1 tablet (81 mg total) by mouth daily. 12/11/15  Yes Arty Baumgartneroberts, Lindsay B, NP  atorvastatin (LIPITOR) 80 MG tablet Take 1 tablet (80 mg total) by mouth daily at 6 PM. 12/11/15  Yes Arty Baumgartneroberts, Lindsay B, NP  carvedilol (COREG) 12.5 MG tablet take 1 tablet by mouth twice a day with meals Patient taking differently: 25mg  by mouth twice daily 02/24/13  Yes Rollene RotundaHochrein, Josuha, MD  isosorbide mononitrate (IMDUR) 30 MG 24 hr tablet  Take 1 tablet (30 mg total) by mouth daily. 12/11/15  Yes Arty Baumgartner, NP  nitroGLYCERIN (NITROSTAT) 0.4 MG SL tablet Place 1 tablet (0.4 mg total) under the tongue every 5 (five) minutes x 3 doses as needed for chest pain. 12/11/15  Yes Laverda Page B, NP  pantoprazole (PROTONIX) 40 MG tablet Take 1 tablet (40 mg total) by mouth daily. 12/11/15  Yes Arty Baumgartner, NP  cephALEXin (KEFLEX) 500 MG capsule Take 1 capsule (500 mg total) by mouth 4 (four) times daily. 06/22/17   Liberty Handy, PA-C  HUMULIN 70/30 (70-30) 100 UNIT/ML injection Inject 100 Units  into the skin 2 (two) times daily. 11/05/15   [provider]  potassium chloride (K-DUR) 10 MEQ tablet Take 4 tablets (40 mEq total) by mouth daily for 7 days. 06/22/17 06/29/17  Liberty Handy, PA-C    Family History Family History  Problem Relation Age of Onset  . Hypertension Mother   . Diabetes Mother     Social History Social History   Tobacco Use  . Smoking status: Former Smoker    Last attempt to quit: 02/10/2003    Years since quitting: 14.3  Substance Use Topics  . Alcohol use: Yes  . Drug use: No     Allergies   Patient has no known allergies.   Review of Systems Review of Systems  Respiratory: Positive for shortness of breath (chronic, unchanged).   Gastrointestinal: Positive for nausea (resolved).  Neurological: Positive for dizziness.  All other systems reviewed and are negative.    Physical Exam Updated Vital Signs BP (!) 179/85   Pulse 70   Temp 97.8 F (36.6 C) (Oral)   Resp 14   Ht 5\' 4"  (1.626 m)   Wt 101.2 kg (223 lb)   SpO2 99%   BMI 38.28 kg/m   Physical Exam  Constitutional: He is oriented to person, place, and time. He appears well-developed and well-nourished. No distress.  NAD.  HENT:  Head: Normocephalic and atraumatic.  Right Ear: External ear normal.  Left Ear: External ear normal.  Nose: Nose normal.  Cerumen impaction bilaterally. No mastoid tenderness.   Eyes: Conjunctivae are normal. No scleral icterus.  PERRL and EOMs intact. No nystagmus.   Neck: Normal range of motion. Neck supple.  Cardiovascular: Normal rate, regular rhythm, normal heart sounds and intact distal pulses.  No murmur heard. Intact radial and DP pulses bilaterally. No LE edema.   Pulmonary/Chest: Effort normal and breath sounds normal. He has no wheezes.  Well healed sternal surgical scar.   Abdominal: Soft. There is no tenderness.  Musculoskeletal: Normal range of motion. He exhibits no deformity.  Neurological: He is alert and oriented to  person, place, and time.  Head impulse test: normal (saccade correction)  Nystagmus: none Test of skew: no skew No dysarthria.  Strength 5/5 with hand grip and ankle flexion/extension.   Sensation to light touch intact in hands and feet. Steady gait. Steady tandem walking. Negative Romberg. Negative FTN and heel to shin. CN I and VIII not tested. CN II-XII intact bilaterally.   Skin: Skin is warm and dry. Capillary refill takes less than 2 seconds.  Psychiatric: He has a normal mood and affect. His behavior is normal. Judgment and thought content normal.  Nursing note and vitals reviewed.    ED Treatments / Results  Labs (all labs ordered are listed, but only abnormal results are displayed) Labs Reviewed  BASIC METABOLIC PANEL - Abnormal; Notable for the  following components:      Result Value   Potassium 2.5 (*)    Chloride 117 (*)    CO2 20 (*)    Glucose, Bld 132 (*)    Calcium 6.9 (*)    Anion gap 3 (*)    All other components within normal limits  CBC - Abnormal; Notable for the following components:   RBC 3.40 (*)    Hemoglobin 9.6 (*)    HCT 28.4 (*)    All other components within normal limits  URINALYSIS, ROUTINE W REFLEX MICROSCOPIC - Abnormal; Notable for the following components:   Specific Gravity, Urine 1.031 (*)    Glucose, UA >=500 (*)    Hgb urine dipstick SMALL (*)    Leukocytes, UA SMALL (*)    Bacteria, UA MANY (*)    All other components within normal limits  CBG MONITORING, ED - Abnormal; Notable for the following components:   Glucose-Capillary 116 (*)    All other components within normal limits  I-STAT CHEM 8, ED - Abnormal; Notable for the following components:   Glucose, Bld 124 (*)    Calcium, Ion 1.14 (*)    Hemoglobin 12.2 (*)    HCT 36.0 (*)    All other components within normal limits  MAGNESIUM  I-STAT TROPONIN, ED    EKG  EKG Interpretation None       Radiology No results found.  Procedures Procedures (including  critical care time)  Medications Ordered in ED Medications  potassium chloride 10 mEq in 100 mL IVPB (0 mEq Intravenous Stopped 06/22/17 1946)  potassium chloride SA (K-DUR,KLOR-CON) CR tablet 40 mEq (40 mEq Oral Given 06/22/17 1848)  magnesium sulfate IVPB 1 g 100 mL (0 g Intravenous Stopped 06/22/17 2046)  sodium chloride 0.9 % bolus 1,000 mL (0 mLs Intravenous Stopped 06/22/17 2150)     Initial Impression / Assessment and Plan / ED Course  I have reviewed the triage vital signs and the nursing notes.  Pertinent labs & imaging results that were available during my care of the patient were reviewed by me and considered in my medical decision making (see chart for details).  Clinical Course as of Jun 23 28  Tue Jun 22, 2017  1825 Hemoglobin: (!) 9.6 [CG]  1825 Potassium: (!!) 2.5 [CG]  2016 Calcium Ionized: (!) 1.14 [CG]  2016 Hgb urine dipstick: (!) SMALL [CG]  2016 Leukocytes, UA: (!) SMALL [CG]  2016 WBC, UA: TOO NUMEROUS TO COUNT [CG]  2016 Squamous Epithelial / LPF: NONE SEEN [CG]  2108 Re-evaluated pt after IVF, K and mg.  I ambulated pt and he reported no dizziness, blurred vision, Cp, SOB. States "old birl is back".  He is hopping up and down in place w/o difficulty. Tandem walk normal.   [CG]    Clinical Course User Index [CG] Liberty Handy, PA-C   67 yo male presents for intermittent dizziness with walking only, improved at rest. Ongoing for 1 week. No CP, palpitations.    On initial exam pt reported "off balance" with walking only but able to ambulate with steady gait and normal tandem walk. No symptoms with laying flat, head rotation, sitting up.  Neuro exam otherwise reassuring. Hints exam reassuring.   Lab work remarkable for hypokalemia K 2.5, anemia hgb 9.6 and UTI. Mg and iCa ok. EKG with normal QTc.   Final Clinical Impressions(s) / ED Diagnoses   Pt was given IVF, Mg, K in ED. I reassessed him and symptoms completely  resolved. Repeat neuro exam normal. No red  flag symptoms associated with dizziness, which sounds peripheral.  No focal weakness or sensory deficits to extremities, sudden onset/severe headache, LOC, shortness of breath, chest pain, palpitations.  No known B12 deficiencies or thyroid conditions.  No new medications.  No diplopia, dysarthria, dysphagia, dysmetria. VS reassuring.  Troponin x 1 normal, EKG without arrythmias.   Patient is considered safe to discharge at this time.  I do not think further lab work or emergent imaging is indicated at this time.  Given resolution of symptoms after ED tx today doubt central cause of symptoms including stroke or TIA.   Will discharge with close PCP f/u, strict ED return precautions.  I advised him to schedule appt with PCP for re-eval in 2 days. Patient aware of red flag symptoms to monitor for that would warrant return to ED.   Patient, ED treatment and discharge plan was discussed with supervising physician who is agreeable with plan. Keflex for UTI.   Final diagnoses:  Hypokalemia  Dizziness  Acute cystitis with hematuria    ED Discharge Orders        Ordered    cephALEXin (KEFLEX) 500 MG capsule  4 times daily     06/22/17 2211    potassium chloride (K-DUR) 10 MEQ tablet  Daily,   Status:  Discontinued     06/22/17 2211    potassium chloride (K-DUR) 10 MEQ tablet  Daily     06/22/17 2218        Liberty Handy, PA-C 06/23/17 0030    Nira Conn, MD 06/26/17 [phone]

## 2017-06-22 NOTE — Discharge Instructions (Signed)
Your potassium was low. Take oral potassium as prescribed. Stay well hydrated. Monitor for your dizziness over the next few days. Follow up with primary care doctor or cardiologist in 2 days if symptoms do not improve.   You were noted to have a urinary tract infection. Take antibiotic (keflex) for this.   Return to emergency room for worsening dizziness, passing out, fevers, chest pain

## 2017-06-22 NOTE — ED Notes (Signed)
While ambulating the PT O2 stayed at 98% while pulse was 70.

## 2017-06-22 NOTE — ED Triage Notes (Signed)
Patient states he has been dizzy for 3-4 days now. Patient denies falling during this time. Patient states slight nausea and SOB

## 2017-12-13 ENCOUNTER — Telehealth: Payer: Self-pay | Admitting: Cardiology

## 2017-12-13 NOTE — Telephone Encounter (Signed)
Called the patient and LVM to call back to schedule followup.  Patient has not been seen since 12-26-15.

## 2018-04-11 NOTE — Progress Notes (Signed)
Cardiology Office Note   Date:  04/15/2018   ID:  Mark Fernandez, DOB 16-Apr-1951, MRN [id number]  PCP:  Patient, No Pcp Per Cardiologist:  Rollene Rotunda, MD 03/25/2015 Theodore Demark, PA-C 12/26/2015  Chief Complaint  Patient presents with  . Medication Refill    stomach pain  . Fatigue  . Shortness of Breath    History of Present Illness: Mark Fernandez is a 67 y.o. male with a history of  CABG 2007 (LIMA-LAD, SVG-D1, SVG-OM, SVG-RCA), EF 45%, NSVT, SVT, DM, HTN, HLD, remote tob use, NSTEMI 11/2015 w/ SVG-D1 100%>>med rx, EF 50-55% by echo, leg pain w/ statins  03/29/2018, patient walked in and wanted an appointment, an appointment was made.  Mark Fernandez presents for cardiology follow up.  He will get a little SOB when he walks a block, but can do it if he goes slow.  He will get chest tightness at times. He has gotten it when cutting the grass in hot weather, when raking leaves. He gets episodes 2-3 x a week. They resolve w/ rest.  He has some shortness of breath with chest pain, but no nausea, vomiting, or diaphoresis.  His usual angina.  Does not wake w/ LE edema. No orthopnea or PND.   He has not had palpitations, is unaware of the PVCs.  He has been out of all of his medications for almost a year.  He says he does not have any excuses, after Dr. Chestine Spore retired, he just quit going.  He has not been on insulin recently, but says he can get that it at [facility].   Past Medical History:  Diagnosis Date  . Arrhythmia    NSVT  . Bell's palsy   . Cardiomyopathy    EF 45%  . Coronary artery disease    2007 status post CABG X 4  (left internal mammary artery to left anterior descending, saphenous vein graft to diagonal, saphenous  vein graft to obtuse marginal, saphenous vein graft to right coronary   artery).  . Diabetes mellitus    type II  . History of tobacco use   . Hypercholesteremia   . Hypertension   . SVT (supraventricular tachycardia) (HCC)      Past Surgical History:  Procedure Laterality Date  . CARDIAC CATHETERIZATION N/A 12/10/2015   Procedure: Left Heart Cath and Cors/Grafts Angiography;  Surgeon: Lennette Bihari, MD;  Location: [facility] INVASIVE CV LAB;  Service: Cardiovascular;  Laterality: N/A;  . CATARACT EXTRACTION, BILATERAL    . CHOLECYSTECTOMY    . CORONARY ARTERY BYPASS GRAFT    . LITHOTRIPSY      Current Outpatient Medications  Medication Sig Dispense Refill  . amLODipine (NORVASC) 10 MG tablet Take 1 tablet (10 mg total) by mouth daily. 30 tablet 11  . aspirin EC 81 MG EC tablet Take 1 tablet (81 mg total) by mouth daily.    Marland Kitchen atorvastatin (LIPITOR) 80 MG tablet Take 1 tablet (80 mg total) by mouth daily at 6 PM. 30 tablet 11  . carvedilol (COREG) 12.5 MG tablet Take 1 tablet (12.5 mg total) by mouth 2 (two) times daily with a meal. 60 tablet 2  . HUMULIN 70/30 (70-30) 100 UNIT/ML injection Inject 100 Units into the skin 2 (two) times daily.  0  . isosorbide mononitrate (IMDUR) 30 MG 24 hr tablet Take 1 tablet (30 mg total) by mouth daily. 30 tablet 11  . nitroGLYCERIN (NITROSTAT) 0.4 MG SL tablet Place 1 tablet (  0.4 mg total) under the tongue every 5 (five) minutes x 3 doses as needed for chest pain. 25 tablet 2  . pantoprazole (PROTONIX) 40 MG tablet Take 1 tablet (40 mg total) by mouth daily. 30 tablet 11   No current facility-administered medications for this visit.     Allergies:   Patient has no known allergies.    Social History:  The patient  reports that he quit smoking about 15 years ago. He has never used smokeless tobacco. He reports that he drinks alcohol. He reports that he does not use drugs.   Family History:  The patient's family history includes Diabetes in his mother; Hypertension in his mother.  He indicated that the status of his mother is unknown. He indicated that his father is deceased.    ROS:  Please see the history of present illness. All other systems are reviewed and negative.     PHYSICAL EXAM: VS:  BP (!) 160/72   Pulse 83   Ht 5\' 4"  (1.626 m)   Wt 201 lb 9.6 oz (91.4 kg)   BMI 34.60 kg/m  , BMI Body mass index is 34.6 kg/m. GEN: Well nourished, well developed, male in no acute distress HEENT: normal for age  Neck: no JVD, no carotid bruit, no masses Cardiac: RRR; no murmur, no rubs, or gallops Respiratory:  clear to auscultation bilaterally, normal work of breathing GI: soft, nontender, nondistended, + BS MS: no deformity or atrophy; no edema; distal pulses are 2+ in all 4 extremities  Skin: warm and dry, no rash Neuro:  Strength and sensation are intact Psych: euthymic mood, full affect   EKG:  EKG is ordered today. The ekg ordered today demonstrates sinus rhythm, heart rate 83, PVCs noted, no change in morphology from 06/2017  ECHO: 12/10/2015 - Left ventricle: The cavity size was normal. There was mild   concentric hypertrophy. Systolic function was normal. The   estimated ejection fraction was in the range of 50% to 55%. Wall   motion was normal; there were no regional wall motion   abnormalities. Doppler parameters are consistent with abnormal   left ventricular relaxation (grade 1 diastolic dysfunction).   Doppler parameters are consistent with elevated ventricular   end-diastolic filling pressure. - Aortic valve: Transvalvular velocity was within the normal range.   There was no stenosis. There was no regurgitation. - Mitral valve: Calcified annulus. Transvalvular velocity was   within the normal range. There was no evidence for stenosis.   There was trivial regurgitation. - Left atrium: The atrium was moderately dilated. - Right ventricle: The cavity size was normal. Wall thickness was   normal. Systolic function was normal. - Atrial septum: No defect or patent foramen ovale was identified   by color flow Doppler. - Tricuspid valve: There was trivial regurgitation. - Pulmonary arteries: Systolic pressure was within the normal    range.  CATH:   Ost LAD to Prox LAD lesion, 70% stenosed.  Mid RCA lesion, 100% stenosed.  2nd Mrg lesion, 99% stenosed.  Lat 1st Mrg lesion, 99% stenosed.  1st Mrg lesion, 80% stenosed.  LIMA .  Dist LAD lesion, 80% stenosed.  Mid Cx lesion, 80% stenosed.  Prox RCA lesion, 30% stenosed.  SVG .  Prox Graft lesion, 40% stenosed.  Ost RPDA to RPDA lesion, 95% stenosed.  SVG .  Origin lesion, 100% stenosed.  SVG .  The LIMA graft was widely patent and anastomosed into the mid LAD. The LAD beyond the anastomosis was  small caliber. There was diffuse narrowing of 80% in the mid distal LAD segment.  The SVG to the distal RCA was a large graft. There was 40% smooth eccentric proximal stenosis focally. The graft anastomosing to the distal RCA. The first PDA branch was diffusely narrowed 99%. The distal RCA and is in a small posterolateral branch.  The SVG to the diagonal vessel was totally occluded at its origin.  The SVG supplying the marginal vessel was widely patent. There was narrowing in a branch of the marginal after the anastomosis of about 70%.    Severe multi-vessel native LAD disease with diffuse 70% proximal LAD stenosis; 90 and 99% stenoses in the OM1 and OM 2 branches of the circumflex coronary artery with 80% mid AV groove circumflex stenosis after the takeoff of the second marginal branch; 30% proximal in total occlusion of the mid RCA after an RV marginal branch.  Patent LIMA graft supplying the mid LAD but with diffusely diseased small caliber LAD with narrowing of 80% in the mid distal segment.  Patent SVG to circumflex marginal branch.  Occluded SVG which  previously supplied the diagonal vessel.  Pain SVG to the distal RCA, but with 40% smooth eccentric focal proximal stenosis in the proximal segment of the graft and severe diffuse subtotal PDA stenosis in the native RCA beyond the anastomosis.  LVEDP 20 mmHg.  RECOMMENDATION: Increased  medical therapy.  The patient is already on amlodipine as well as carvedilol.  With diffuse native CAD consider the addition of Ranexa and oral nitrates.  A 2-D echo Doppler study will be ordered to assess LV function.  The patient will be hydrated post procedure with his underlying renal insufficiency. Diagnostic Diagram        Recent Labs: 06/22/2017: BUN 10; Creatinine, Ser 1.00; Hemoglobin 12.2; Magnesium 1.8; Platelets 323; Potassium 3.8; Sodium 142  CBC    Component Value Date/Time   WBC 5.3 06/22/2017 1714   RBC 3.40 (L) 06/22/2017 1714   HGB 12.2 (L) 06/22/2017 1933   HCT 36.0 (L) 06/22/2017 1933   PLT 323 06/22/2017 1714   MCV 83.5 06/22/2017 1714   MCH 28.2 06/22/2017 1714   MCHC 33.8 06/22/2017 1714   RDW 14.4 06/22/2017 1714   LYMPHSABS 2.3 12/09/2015 0944   MONOABS 0.4 12/09/2015 0944   EOSABS 0.4 12/09/2015 0944   BASOSABS 0.0 12/09/2015 0944   CMP Latest Ref Rng & Units 06/22/2017 06/22/2017 11/20/2016  Glucose 65 - 99 mg/dL 161(W) 960(A) -  BUN 6 - 20 mg/dL 10 7 -  Creatinine 5.40 - 1.24 mg/dL 9.81 1.91 4.78(G)  Sodium 135 - 145 mmol/L 142 140 -  Potassium 3.5 - 5.1 mmol/L 3.8 2.5(LL) -  Chloride 101 - 111 mmol/L 106 117(H) -  CO2 22 - 32 mmol/L - 20(L) -  Calcium 8.9 - 10.3 mg/dL - 6.9(L) -  Total Protein 6.5 - 8.1 g/dL - - -  Total Bilirubin 0.3 - 1.2 mg/dL - - -  Alkaline Phos 38 - 126 U/L - - -  AST 15 - 41 U/L - - -  ALT 17 - 63 U/L - - -     Lipid Panel    Component Value Date/Time   CHOL 213 (H) 12/09/2015 0949   TRIG 207 (H) 12/09/2015 0949   HDL 25 (L) 12/09/2015 0949   CHOLHDL 8.5 12/09/2015 0949   VLDL 41 (H) 12/09/2015 0949   LDLCALC 147 (H) 12/09/2015 0949     Wt Readings from Last 3  Encounters:  04/15/18 201 lb 9.6 oz (91.4 kg)  06/22/17 223 lb (101.2 kg)  12/26/15 220 lb 3.2 oz (99.9 kg)     Other studies Reviewed: Additional studies/ records that were reviewed today include: Office notes, hospital records and testing.  ASSESSMENT  AND PLAN:  1.  CAD, chronic stable angina: -Restart medications including amlodipine, Coreg, aspirin, Imdur, atorvastatin. - I discussed collateral circulation.  He understands that he can get a little chest pain and if it goes away with rest, he can restart what he was doing before to try and build himself up. - I offered him cardiac rehab for chronic stable angina, he wishes to try it on his own for now. - Follow-up and let us know if his symptoms do not improve  2.  Hypertension: - Restart the medications he was previously taking every 2 days so that his blood pressure does not drop precipitously. -Let us know about side effects, follow-up in 4-6 weeks  3.  Hyperlipidemia, goal LDL less than 70: - Restart Lipitor 80 mg daily. - Check CMET and lipid profile today  4.  Anemia: - January 2019, he was in the ER for hypokalemia.  His hemoglobin at that time was 9.6 -He has not had any evaluation for this, recheck today  5.  Diabetes: He has not been taking his insulin.  He is appointment to establish care with a new PCP is not until January.  However, he states he can get insulin through the [place].  He is encouraged to go down to the [place] to get the insulin.  He is encouraged to eat a diabetic diet.  Current medicines are reviewed at length with the patient today.  The patient has concerns regarding medicines.  Concerns were addressed  The following changes have been made: Restart medications  Labs/ tests ordered today include:   Orders Placed This Encounter  Procedures  . Comprehensive metabolic panel  . CBC  . Lipid panel  . EKG 12-Lead     Disposition:   FU with Rollene Rotunda, MD in a year and with myself in 4-6 weeks Keep appointment with Ms.Laura Dayton Scrape, FNP in January  Signed, Rhonda Barrett, PA-C  04/15/2018 9:11 AM     [facility] Phone: [phone]; Fax: [phone]  This note was written with the assistance of speech recognition software.   Please excuse any transcriptional errors.

## 2018-04-15 ENCOUNTER — Ambulatory Visit: Payer: Medicare Other | Admitting: Physician Assistant

## 2018-04-15 ENCOUNTER — Encounter: Payer: Self-pay | Admitting: Physician Assistant

## 2018-04-15 VITALS — BP 160/72 | HR 83 | Ht 64.0 in | Wt 201.6 lb

## 2018-04-15 DIAGNOSIS — I25708 Atherosclerosis of coronary artery bypass graft(s), unspecified, with other forms of angina pectoris: Secondary | ICD-10-CM | POA: Diagnosis not present

## 2018-04-15 DIAGNOSIS — D649 Anemia, unspecified: Secondary | ICD-10-CM

## 2018-04-15 DIAGNOSIS — I10 Essential (primary) hypertension: Secondary | ICD-10-CM

## 2018-04-15 DIAGNOSIS — I208 Other forms of angina pectoris: Secondary | ICD-10-CM | POA: Diagnosis not present

## 2018-04-15 DIAGNOSIS — E785 Hyperlipidemia, unspecified: Secondary | ICD-10-CM

## 2018-04-15 LAB — LIPID PANEL
CHOL/HDL RATIO: 7.7 ratio — AB (ref 0.0–5.0)
Cholesterol, Total: 209 mg/dL — ABNORMAL HIGH (ref 100–199)
HDL: 27 mg/dL — AB (ref >39)
LDL CALC: 121 mg/dL — AB (ref 0–99)
TRIGLYCERIDES: 306 mg/dL — AB (ref 0–149)
VLDL Cholesterol Cal: 61 mg/dL — ABNORMAL HIGH (ref 5–40)

## 2018-04-15 LAB — CBC
HEMATOCRIT: 36.4 % — AB (ref 37.5–51.0)
HEMOGLOBIN: 12.1 g/dL — AB (ref 13.0–17.7)
MCH: 27.7 pg (ref 26.6–33.0)
MCHC: 33.2 g/dL (ref 31.5–35.7)
MCV: 83 fL (ref 79–97)
Platelets: 329 10*3/uL (ref 150–450)
RBC: 4.37 x10E6/uL (ref 4.14–5.80)
RDW: 13.5 % (ref 12.3–15.4)
WBC: 5.2 10*3/uL (ref 3.4–10.8)

## 2018-04-15 LAB — COMPREHENSIVE METABOLIC PANEL
ALT: 7 IU/L (ref 0–44)
AST: 12 IU/L (ref 0–40)
Albumin/Globulin Ratio: 1.2 (ref 1.2–2.2)
Albumin: 4 g/dL (ref 3.6–4.8)
Alkaline Phosphatase: 69 IU/L (ref 39–117)
BUN/Creatinine Ratio: 7 — ABNORMAL LOW (ref 10–24)
BUN: 10 mg/dL (ref 8–27)
Bilirubin Total: 0.7 mg/dL (ref 0.0–1.2)
CO2: 23 mmol/L (ref 20–29)
CREATININE: 1.34 mg/dL — AB (ref 0.76–1.27)
Calcium: 9.5 mg/dL (ref 8.6–10.2)
Chloride: 97 mmol/L (ref 96–106)
GFR calc non Af Amer: 54 mL/min/{1.73_m2} — ABNORMAL LOW (ref >59)
GFR, EST AFRICAN AMERICAN: 62 mL/min/{1.73_m2} (ref >59)
GLUCOSE: 457 mg/dL — AB (ref 65–99)
Globulin, Total: 3.3 g/dL (ref 1.5–4.5)
Potassium: 3.8 mmol/L (ref 3.5–5.2)
Sodium: 136 mmol/L (ref 134–144)
TOTAL PROTEIN: 7.3 g/dL (ref 6.0–8.5)

## 2018-04-15 MED ORDER — PANTOPRAZOLE SODIUM 40 MG PO TBEC: 40.0000 mg | DELAYED_RELEASE_TABLET | Freq: Every day | ORAL | 11 refills | Status: AC

## 2018-04-15 MED ORDER — ATORVASTATIN CALCIUM 80 MG PO TABS: 80.0000 mg | ORAL_TABLET | Freq: Every day | ORAL | 11 refills | Status: AC

## 2018-04-15 MED ORDER — CARVEDILOL 12.5 MG PO TABS: 12.5000 mg | ORAL_TABLET | Freq: Two times a day (BID) | ORAL | 2 refills | Status: AC

## 2018-04-15 MED ORDER — ISOSORBIDE MONONITRATE ER 30 MG PO TB24: 30.0000 mg | ORAL_TABLET | Freq: Every day | ORAL | 11 refills | Status: AC

## 2018-04-15 MED ORDER — NITROGLYCERIN 0.4 MG SL SUBL: 0.4000 mg | SUBLINGUAL_TABLET | SUBLINGUAL | 2 refills | Status: AC | PRN

## 2018-04-15 MED ORDER — AMLODIPINE BESYLATE 10 MG PO TABS: 10.0000 mg | ORAL_TABLET | Freq: Every day | ORAL | 11 refills | Status: DC

## 2018-04-15 NOTE — Patient Instructions (Signed)
Medication Instructions:  No Changes If you need a refill on your cardiac medications before your next appointment, please call your pharmacy.   Lab work: CBC, CMET, Lipid Panel If you have labs (blood work) drawn today and your tests are completely normal, you will receive your results only by: Marland Kitchen MyChart Message (if you have MyChart) OR . A paper copy in the mail If you have any lab test that is abnormal or we need to change your treatment, we will call you to review the results.  Testing/Procedures: None Ordered  Follow-Up: At [facility], you and your health needs are our priority.  As part of our continuing mission to provide you with exceptional heart care, we have created designated Provider Care Teams.  These Care Teams include your primary Cardiologist (physician) and Advanced Practice Providers (APPs -  Physician Assistants and Nurse Practitioners) who all work together to provide you with the care you need, when you need it. . Follow up with Mark Demark, PA-C in 6 weeks. . Follow up with Mark Fernandez in 1 year.  Any Other Special Instructions Will Be Listed Below (If Applicable). [facility]. Walk as much as you can. If you have chest pain while doing activity, rest and take nitroglycerin then okay to restart activity, this is to build collateral circulation.

## 2018-05-15 ENCOUNTER — Observation Stay (HOSPITAL_COMMUNITY)
Admission: EM | Admit: 2018-05-15 | Discharge: 2018-05-18 | Disposition: A | Discharge status: Home / Self Care | Payer: Medicare Other | Attending: Internal Medicine | Admitting: Internal Medicine

## 2018-05-15 ENCOUNTER — Other Ambulatory Visit: Payer: Self-pay

## 2018-05-15 ENCOUNTER — Encounter (HOSPITAL_COMMUNITY): Payer: Self-pay | Admitting: Emergency Medicine

## 2018-05-15 ENCOUNTER — Emergency Department (HOSPITAL_COMMUNITY): Payer: Medicare Other

## 2018-05-15 DIAGNOSIS — I252 Old myocardial infarction: Secondary | ICD-10-CM | POA: Diagnosis not present

## 2018-05-15 DIAGNOSIS — R339 Retention of urine, unspecified: Secondary | ICD-10-CM | POA: Diagnosis not present

## 2018-05-15 DIAGNOSIS — E860 Dehydration: Secondary | ICD-10-CM

## 2018-05-15 DIAGNOSIS — I429 Cardiomyopathy, unspecified: Secondary | ICD-10-CM | POA: Diagnosis not present

## 2018-05-15 DIAGNOSIS — I951 Orthostatic hypotension: Secondary | ICD-10-CM | POA: Diagnosis not present

## 2018-05-15 DIAGNOSIS — E1110 Type 2 diabetes mellitus with ketoacidosis without coma: Principal | ICD-10-CM | POA: Insufficient documentation

## 2018-05-15 DIAGNOSIS — Z8249 Family history of ischemic heart disease and other diseases of the circulatory system: Secondary | ICD-10-CM | POA: Diagnosis not present

## 2018-05-15 DIAGNOSIS — Z794 Long term (current) use of insulin: Secondary | ICD-10-CM | POA: Diagnosis not present

## 2018-05-15 DIAGNOSIS — E1310 Other specified diabetes mellitus with ketoacidosis without coma: Secondary | ICD-10-CM

## 2018-05-15 DIAGNOSIS — N179 Acute kidney failure, unspecified: Secondary | ICD-10-CM | POA: Insufficient documentation

## 2018-05-15 DIAGNOSIS — I471 Supraventricular tachycardia: Secondary | ICD-10-CM | POA: Diagnosis not present

## 2018-05-15 DIAGNOSIS — Z79899 Other long term (current) drug therapy: Secondary | ICD-10-CM | POA: Insufficient documentation

## 2018-05-15 DIAGNOSIS — I2510 Atherosclerotic heart disease of native coronary artery without angina pectoris: Secondary | ICD-10-CM | POA: Diagnosis present

## 2018-05-15 DIAGNOSIS — E785 Hyperlipidemia, unspecified: Secondary | ICD-10-CM | POA: Insufficient documentation

## 2018-05-15 DIAGNOSIS — Z7982 Long term (current) use of aspirin: Secondary | ICD-10-CM | POA: Diagnosis not present

## 2018-05-15 DIAGNOSIS — R42 Dizziness and giddiness: Secondary | ICD-10-CM | POA: Diagnosis present

## 2018-05-15 DIAGNOSIS — G510 Bell's palsy: Secondary | ICD-10-CM | POA: Diagnosis not present

## 2018-05-15 DIAGNOSIS — Z951 Presence of aortocoronary bypass graft: Secondary | ICD-10-CM | POA: Diagnosis not present

## 2018-05-15 DIAGNOSIS — Z87891 Personal history of nicotine dependence: Secondary | ICD-10-CM | POA: Insufficient documentation

## 2018-05-15 DIAGNOSIS — E1165 Type 2 diabetes mellitus with hyperglycemia: Secondary | ICD-10-CM | POA: Diagnosis not present

## 2018-05-15 DIAGNOSIS — I25118 Atherosclerotic heart disease of native coronary artery with other forms of angina pectoris: Secondary | ICD-10-CM | POA: Diagnosis not present

## 2018-05-15 DIAGNOSIS — Z9049 Acquired absence of other specified parts of digestive tract: Secondary | ICD-10-CM | POA: Insufficient documentation

## 2018-05-15 DIAGNOSIS — I119 Hypertensive heart disease without heart failure: Secondary | ICD-10-CM | POA: Insufficient documentation

## 2018-05-15 DIAGNOSIS — Z833 Family history of diabetes mellitus: Secondary | ICD-10-CM | POA: Diagnosis not present

## 2018-05-15 DIAGNOSIS — R0602 Shortness of breath: Secondary | ICD-10-CM

## 2018-05-15 DIAGNOSIS — Z9119 Patient's noncompliance with other medical treatment and regimen: Secondary | ICD-10-CM | POA: Insufficient documentation

## 2018-05-15 DIAGNOSIS — E119 Type 2 diabetes mellitus without complications: Secondary | ICD-10-CM

## 2018-05-15 LAB — URINALYSIS, ROUTINE W REFLEX MICROSCOPIC
BACTERIA UA: NONE SEEN
Bilirubin Urine: NEGATIVE
Glucose, UA: >=500 mg/dL — AB
KETONES UR: 20 mg/dL — AB
Leukocytes, UA: NEGATIVE
Nitrite: NEGATIVE
PROTEIN: 30 mg/dL — AB
Specific Gravity, Urine: 1.022 (ref 1.005–1.030)
pH: 6 (ref 5.0–8.0)

## 2018-05-15 LAB — CBC WITH DIFFERENTIAL/PLATELET
Abs Immature Granulocytes: 0.01 10*3/uL (ref 0.00–0.07)
Basophils Absolute: 0 10*3/uL (ref 0.0–0.1)
Basophils Relative: 0 %
Eosinophils Absolute: 0.1 10*3/uL (ref 0.0–0.5)
Eosinophils Relative: 1 %
HCT: 35.2 % — ABNORMAL LOW (ref 39.0–52.0)
Hemoglobin: 11.6 g/dL — ABNORMAL LOW (ref 13.0–17.0)
Immature Granulocytes: 0 %
Lymphocytes Relative: 27 %
Lymphs Abs: 1.7 10*3/uL (ref 0.7–4.0)
MCH: 27.6 pg (ref 26.0–34.0)
MCHC: 33 g/dL (ref 30.0–36.0)
MCV: 83.6 fL (ref 80.0–100.0)
Monocytes Absolute: 0.9 10*3/uL (ref 0.1–1.0)
Monocytes Relative: 15 %
Neutro Abs: 3.5 10*3/uL (ref 1.7–7.7)
Neutrophils Relative %: 57 %
Platelets: 308 10*3/uL (ref 150–400)
RBC: 4.21 MIL/uL — ABNORMAL LOW (ref 4.22–5.81)
RDW: 12.5 % (ref 11.5–15.5)
WBC: 6.3 10*3/uL (ref 4.0–10.5)
nRBC: 0 % (ref 0.0–0.2)

## 2018-05-15 LAB — I-STAT TROPONIN, ED: Troponin i, poc: 0.02 ng/mL (ref 0.00–0.08)

## 2018-05-15 LAB — BASIC METABOLIC PANEL
ANION GAP: 11 (ref 5–15)
ANION GAP: 10 (ref 5–15)
Anion gap: 17 — ABNORMAL HIGH (ref 5–15)
BUN: 15 mg/dL (ref 8–23)
BUN: 13 mg/dL (ref 8–23)
BUN: 13 mg/dL (ref 8–23)
CALCIUM: 8.4 mg/dL — AB (ref 8.9–10.3)
CO2: 19 mmol/L — ABNORMAL LOW (ref 22–32)
CO2: 20 mmol/L — AB (ref 22–32)
CO2: 20 mmol/L — ABNORMAL LOW (ref 22–32)
Calcium: 8.7 mg/dL — ABNORMAL LOW (ref 8.9–10.3)
Calcium: 8.4 mg/dL — ABNORMAL LOW (ref 8.9–10.3)
Chloride: 96 mmol/L — ABNORMAL LOW (ref 98–111)
Chloride: 95 mmol/L — ABNORMAL LOW (ref 98–111)
Chloride: 95 mmol/L — ABNORMAL LOW (ref 98–111)
Creatinine, Ser: 1.47 mg/dL — ABNORMAL HIGH (ref 0.61–1.24)
Creatinine, Ser: 1.23 mg/dL (ref 0.61–1.24)
Creatinine, Ser: 1.2 mg/dL (ref 0.61–1.24)
GFR calc non Af Amer: 58 mL/min — ABNORMAL LOW (ref >60)
GFR calc non Af Amer: 60 mL/min — ABNORMAL LOW (ref >60)
Glucose, Bld: 276 mg/dL — ABNORMAL HIGH (ref 70–99)
Glucose, Bld: 267 mg/dL — ABNORMAL HIGH (ref 70–99)
Glucose, Bld: 274 mg/dL — ABNORMAL HIGH (ref 70–99)
Potassium: 3.2 mmol/L — ABNORMAL LOW (ref 3.5–5.1)
Potassium: 3 mmol/L — ABNORMAL LOW (ref 3.5–5.1)
Potassium: 4 mmol/L (ref 3.5–5.1)
Sodium: 132 mmol/L — ABNORMAL LOW (ref 135–145)
Sodium: 126 mmol/L — ABNORMAL LOW (ref 135–145)
Sodium: 125 mmol/L — ABNORMAL LOW (ref 135–145)

## 2018-05-15 LAB — MAGNESIUM: Magnesium: 1.7 mg/dL (ref 1.7–2.4)

## 2018-05-15 LAB — GLUCOSE, CAPILLARY: GLUCOSE-CAPILLARY: 251 mg/dL — AB (ref 70–99)

## 2018-05-15 LAB — CBG MONITORING, ED
Glucose-Capillary: 215 mg/dL — ABNORMAL HIGH (ref 70–99)
Glucose-Capillary: 250 mg/dL — ABNORMAL HIGH (ref 70–99)

## 2018-05-15 LAB — HEMOGLOBIN A1C
HEMOGLOBIN A1C: 12.3 % — AB (ref 4.8–5.6)
Mean Plasma Glucose: 306.31 mg/dL

## 2018-05-15 LAB — BRAIN NATRIURETIC PEPTIDE: B Natriuretic Peptide: 105.4 pg/mL — ABNORMAL HIGH (ref 0.0–100.0)

## 2018-05-15 MED ORDER — INSULIN ASPART 100 UNIT/ML ~~LOC~~ SOLN
0.0000 [IU] | SUBCUTANEOUS | Status: DC
  Administered 2018-05-16: 2 [IU] via SUBCUTANEOUS
  Administered 2018-05-16: 12 [IU] via SUBCUTANEOUS
  Administered 2018-05-16: 2 [IU] via SUBCUTANEOUS

## 2018-05-15 MED ORDER — ISOSORBIDE MONONITRATE ER 30 MG PO TB24
30.0000 mg | ORAL_TABLET | Freq: Every day | ORAL | Status: DC
  Administered 2018-05-16 – 2018-05-18 (×3): 30 mg via ORAL
  Filled 2018-05-15 (×3): qty 1

## 2018-05-15 MED ORDER — ONDANSETRON HCL 4 MG PO TABS: 4.0000 mg | ORAL_TABLET | Freq: Four times a day (QID) | ORAL | Status: DC | PRN

## 2018-05-15 MED ORDER — ACETAMINOPHEN 325 MG PO TABS
650.0000 mg | ORAL_TABLET | Freq: Four times a day (QID) | ORAL | Status: DC | PRN
  Administered 2018-05-15 – 2018-05-18 (×5): 650 mg via ORAL
  Filled 2018-05-15 (×5): qty 2

## 2018-05-15 MED ORDER — SODIUM CHLORIDE 0.9 % IV SOLN: INTRAVENOUS | Status: AC

## 2018-05-15 MED ORDER — DEXTROSE-NACL 5-0.45 % IV SOLN: INTRAVENOUS | Status: DC

## 2018-05-15 MED ORDER — SODIUM CHLORIDE 0.9 % IV SOLN: INTRAVENOUS | Status: DC

## 2018-05-15 MED ORDER — ATORVASTATIN CALCIUM 80 MG PO TABS
80.0000 mg | ORAL_TABLET | Freq: Every day | ORAL | Status: DC
  Administered 2018-05-15 – 2018-05-17 (×3): 80 mg via ORAL
  Filled 2018-05-15 (×3): qty 1

## 2018-05-15 MED ORDER — CARVEDILOL 6.25 MG PO TABS
6.2500 mg | ORAL_TABLET | Freq: Two times a day (BID) | ORAL | Status: DC
  Administered 2018-05-15 – 2018-05-18 (×6): 6.25 mg via ORAL
  Filled 2018-05-15 (×7): qty 1

## 2018-05-15 MED ORDER — INSULIN DETEMIR 100 UNIT/ML ~~LOC~~ SOLN
10.0000 [IU] | Freq: Once | SUBCUTANEOUS | Status: AC
  Administered 2018-05-15: 10 [IU] via SUBCUTANEOUS
  Filled 2018-05-15: qty 0.1

## 2018-05-15 MED ORDER — POLYETHYLENE GLYCOL 3350 17 G PO PACK: 17.0000 g | PACK | Freq: Every day | ORAL | Status: DC | PRN

## 2018-05-15 MED ORDER — HEPARIN SODIUM (PORCINE) 5000 UNIT/ML IJ SOLN
5000.0000 [IU] | Freq: Three times a day (TID) | INTRAMUSCULAR | Status: DC
  Administered 2018-05-15 – 2018-05-17 (×5): 5000 [IU] via SUBCUTANEOUS
  Filled 2018-05-15 (×5): qty 1

## 2018-05-15 MED ORDER — ACETAMINOPHEN 650 MG RE SUPP: 650.0000 mg | Freq: Four times a day (QID) | RECTAL | Status: DC | PRN

## 2018-05-15 MED ORDER — SODIUM CHLORIDE 0.9 % IV BOLUS
500.0000 mL | Freq: Once | INTRAVENOUS | Status: AC
  Administered 2018-05-15: 500 mL via INTRAVENOUS

## 2018-05-15 MED ORDER — SODIUM CHLORIDE 0.9% FLUSH: 3.0000 mL | INTRAVENOUS | Status: DC | PRN

## 2018-05-15 MED ORDER — POTASSIUM CHLORIDE 10 MEQ/100ML IV SOLN: 10.0000 meq | INTRAVENOUS | Status: AC

## 2018-05-15 MED ORDER — SODIUM CHLORIDE 0.9 % IV SOLN: 250.0000 mL | INTRAVENOUS | Status: DC | PRN

## 2018-05-15 MED ORDER — ONDANSETRON HCL 4 MG/2ML IJ SOLN
4.0000 mg | Freq: Four times a day (QID) | INTRAMUSCULAR | Status: DC | PRN
  Administered 2018-05-16: 4 mg via INTRAVENOUS
  Filled 2018-05-15: qty 2

## 2018-05-15 MED ORDER — POTASSIUM CHLORIDE CRYS ER 20 MEQ PO TBCR
40.0000 meq | EXTENDED_RELEASE_TABLET | Freq: Once | ORAL | Status: AC
  Administered 2018-05-15: 40 meq via ORAL
  Filled 2018-05-15: qty 2

## 2018-05-15 MED ORDER — NITROGLYCERIN 0.4 MG SL SUBL: 0.4000 mg | SUBLINGUAL_TABLET | SUBLINGUAL | Status: DC | PRN

## 2018-05-15 MED ORDER — ASPIRIN EC 81 MG PO TBEC
81.0000 mg | DELAYED_RELEASE_TABLET | Freq: Every day | ORAL | Status: DC
  Administered 2018-05-16 – 2018-05-18 (×3): 81 mg via ORAL
  Filled 2018-05-15 (×3): qty 1

## 2018-05-15 MED ORDER — PANTOPRAZOLE SODIUM 40 MG PO TBEC
40.0000 mg | DELAYED_RELEASE_TABLET | Freq: Every day | ORAL | Status: DC
  Administered 2018-05-16 – 2018-05-18 (×3): 40 mg via ORAL
  Filled 2018-05-15 (×3): qty 1

## 2018-05-15 MED ORDER — TRAZODONE HCL 50 MG PO TABS: 50.0000 mg | ORAL_TABLET | Freq: Every evening | ORAL | Status: DC | PRN

## 2018-05-15 MED ORDER — SODIUM CHLORIDE 0.9% FLUSH
3.0000 mL | Freq: Two times a day (BID) | INTRAVENOUS | Status: DC
  Administered 2018-05-15: 3 mL via INTRAVENOUS

## 2018-05-15 MED ORDER — CARVEDILOL 12.5 MG PO TABS: 12.5000 mg | ORAL_TABLET | Freq: Two times a day (BID) | ORAL | Status: DC

## 2018-05-15 MED ORDER — ALBUTEROL SULFATE (2.5 MG/3ML) 0.083% IN NEBU: 2.5000 mg | INHALATION_SOLUTION | RESPIRATORY_TRACT | Status: DC | PRN

## 2018-05-15 MED ORDER — INSULIN REGULAR(HUMAN) IN NACL 100-0.9 UT/100ML-% IV SOLN: INTRAVENOUS | Status: DC

## 2018-05-15 NOTE — ED Notes (Signed)
Lab reports adding BNP on to previous blood draw.

## 2018-05-15 NOTE — ED Triage Notes (Signed)
Per [facility] pt coming from home c/o dizziness and generalized weakness x 1 week. Patient adds increased urination and nausea. CBG 289.

## 2018-05-15 NOTE — ED Provider Notes (Signed)
[facility] EMERGENCY DEPARTMENT Provider Note   CSN: [id number] Arrival date & time: 05/15/18  1205     History   Chief Complaint Chief Complaint  Patient presents with  . Dizziness    HPI Mark Fernandez is a 67 y.o. male with history of arrhythmia, Bell's palsy with residual left facial droop, cardiomyopathy, diabetes mellitus, CAD status post CABG x4, hypertension, hyperlipidemia resents for evaluation of acute onset, progressively worsening generalized weakness for approximately 1.5 weeks.  He notes associated difficulty ambulating feeling as though he is "drunk "when he walks.  He typically ambulates with the assistance of a cane.  He lives at home alone.  He notes that he has just not had enough energy to get through his day and feels significantly short of breath with even minimal exertion.  He also notes intermittent left-sided chest pains which occur with exertion but improved with rest.  This of breath also improves with rest.  Denies leg swelling, abdominal pain, nausea, vomiting, headaches, vision changes, numbness, tingling, or focal weakness.  Endorses decreased oral intake and decreased urine output. He does note that he has stopped taking all of his medications for some time but recently followed up at his cardiologist office at the end of October and had his medications refilled.  He is on baby aspirin but no other anticoagulation.  His cardiologist is Dr. Antoine Poche.  Daughter expresses concern that he is not as active or "peppy" as he normally is.  She does not live with him and notes that when she came to visit him today she was shocked by his lack of energy and difficulty caring for himself.   The history is provided by the patient and a relative.    Past Medical History:  Diagnosis Date  . Arrhythmia    NSVT  . Bell's palsy   . Cardiomyopathy    EF 45%  . Coronary artery disease    2007 status post CABG X 4  (left internal mammary artery to left  anterior descending, saphenous vein graft to diagonal, saphenous  vein graft to obtuse marginal, saphenous vein graft to right coronary   artery).  . Diabetes mellitus    type II  . History of tobacco use   . Hypercholesteremia   . Hypertension   . SVT (supraventricular tachycardia) [facility])     Patient Active Problem List   Diagnosis Date Noted  . DKA (diabetic ketoacidosis) (HCC) 05/15/2018  . Chest pain 12/09/2015  . Abdominal pain 12/09/2015  . Diabetes (HCC) 12/09/2015  . NSTEMI (non-ST elevated myocardial infarction) (HCC) 12/09/2015  . Overweight(278.02) 02/10/2011  . Hypertension   . Coronary artery disease   . Cardiomyopathy (HCC)   . Hypercholesteremia   . Arrhythmia     Past Surgical History:  Procedure Laterality Date  . CARDIAC CATHETERIZATION N/A 12/10/2015   Procedure: Left Heart Cath and Cors/Grafts Angiography;  Surgeon: Lennette Bihari, MD;  Location: [facility] INVASIVE CV LAB;  Service: Cardiovascular;  Laterality: N/A;  . CATARACT EXTRACTION, BILATERAL    . CHOLECYSTECTOMY    . CORONARY ARTERY BYPASS GRAFT    . LITHOTRIPSY          Home Medications    Prior to Admission medications   Medication Sig Start Date End Date Taking? Authorizing Provider  amLODipine (NORVASC) 10 MG tablet Take 1 tablet (10 mg total) by mouth daily. 04/15/18   Barrett, Joline Salt, PA-C  aspirin EC 81 MG EC tablet Take 1 tablet (81  mg total) by mouth daily. 12/11/15   Arty Baumgartner, NP  atorvastatin (LIPITOR) 80 MG tablet Take 1 tablet (80 mg total) by mouth daily at 6 PM. 04/15/18   Barrett, Joline Salt, PA-C  carvedilol (COREG) 12.5 MG tablet Take 1 tablet (12.5 mg total) by mouth 2 (two) times daily with a meal. 04/15/18   Barrett, Joline Salt, PA-C  HUMULIN 70/30 (70-30) 100 UNIT/ML injection Inject 100 Units into the skin 2 (two) times daily. 11/05/15   [provider]  isosorbide mononitrate (IMDUR) 30 MG 24 hr tablet Take 1 tablet (30 mg total) by mouth daily. 04/15/18    Barrett, Joline Salt, PA-C  nitroGLYCERIN (NITROSTAT) 0.4 MG SL tablet Place 1 tablet (0.4 mg total) under the tongue every 5 (five) minutes x 3 doses as needed for chest pain. 04/15/18   Barrett, Joline Salt, PA-C  pantoprazole (PROTONIX) 40 MG tablet Take 1 tablet (40 mg total) by mouth daily. 04/15/18   Barrett, Joline Salt, PA-C    Family History Family History  Problem Relation Age of Onset  . Hypertension Mother   . Diabetes Mother     Social History Social History   Tobacco Use  . Smoking status: Former Smoker    Last attempt to quit: 02/10/2003    Years since quitting: 15.2  . Smokeless tobacco: Never Used  Substance Use Topics  . Alcohol use: Yes  . Drug use: No     Allergies   Patient has no known allergies.   Review of Systems Review of Systems  Constitutional: Positive for fatigue. Negative for chills and fever.  Eyes: Negative for visual disturbance.  Respiratory: Positive for shortness of breath. Negative for cough.   Cardiovascular: Positive for chest pain. Negative for leg swelling.  Gastrointestinal: Negative for abdominal pain, nausea and vomiting.  Neurological: Positive for weakness (generalized). Negative for numbness and headaches.  All other systems reviewed and are negative.    Physical Exam Updated Vital Signs BP 101/72   Pulse 75   Temp 98.4 F (36.9 C) (Oral)   Resp 20   Ht 5\' 4"  (1.626 m)   Wt 98.9 kg   SpO2 98% Comment: While ambulating  BMI 37.42 kg/m   Physical Exam  Constitutional: He is oriented to person, place, and time. He appears well-developed and well-nourished. No distress.  HENT:  Head: Normocephalic and atraumatic.  Eyes: Conjunctivae are normal. Right eye exhibits no discharge. Left eye exhibits no discharge.  Neck: Normal range of motion. Neck supple. No JVD present. No tracheal deviation present.  Cardiovascular: Normal rate, regular rhythm and intact distal pulses.  2+ radial and DP/PT pulses bilaterally, Homans sign  absent bilaterally, no lower extremity edema, no palpable cords, compartments are soft   Pulmonary/Chest: Effort normal.  Globally diminished breath sounds.  Speaking in full sentences without difficulty.  Abdominal: Soft. Bowel sounds are normal. He exhibits no distension. There is no tenderness. There is no guarding.  Musculoskeletal: He exhibits no edema.  Neurological: He is alert and oriented to person, place, and time. No sensory deficit. Coordination abnormal.  Mental Status:  Alert, thought content appropriate, able to give a coherent history. Speech fluent without evidence of aphasia. Able to follow 2 step commands without difficulty.  Cranial Nerves:  II:  Peripheral visual fields grossly normal, pupils equal, round, reactive to light III,IV, VI:  extra-ocular motions intact bilaterally  V,VII: Left facial droop, chronic secondary to Bell's palsy several decades ago. Facial light touch sensation equal VIII:  hearing grossly normal to voice  X: uvula elevates symmetrically  XI: bilateral shoulder shrug symmetric and strong XII: midline tongue extension without fassiculations Motor:  Normal tone.  4+/5 strength of bilateral hip flexors, otherwise 5/5 strength of BUE and BLE major muscle groups including strong and equal grip strength and dorsiflexion/plantar flexion Sensory: light touch normal in all extremities. Cerebellar: normal finger-to-nose with bilateral upper extremities Gait: Ambulates with a slightly shuffling gait, exhibits generally good balance but has mild difficulty with Heel Walk and Toe Walk.  No pronator drift.  Romberg sign absent.  No nystagmus.  Skin: Skin is warm and dry. No erythema.  Psychiatric: He has a normal mood and affect. His behavior is normal.  Nursing note and vitals reviewed.    ED Treatments / Results  Labs (all labs ordered are listed, but only abnormal results are displayed) Labs Reviewed  BASIC METABOLIC PANEL - Abnormal; Notable for the  following components:      Result Value   Sodium 132 (*)    Potassium 3.2 (*)    Chloride 96 (*)    CO2 19 (*)    Glucose, Bld 276 (*)    Creatinine, Ser 1.47 (*)    Calcium 8.7 (*)    Anion gap 17 (*)    All other components within normal limits  CBC WITH DIFFERENTIAL/PLATELET - Abnormal; Notable for the following components:   RBC 4.21 (*)    Hemoglobin 11.6 (*)    HCT 35.2 (*)    All other components within normal limits  BRAIN NATRIURETIC PEPTIDE - Abnormal; Notable for the following components:   B Natriuretic Peptide 105.4 (*)    All other components within normal limits  URINALYSIS, ROUTINE W REFLEX MICROSCOPIC  MAGNESIUM  BASIC METABOLIC PANEL  BASIC METABOLIC PANEL  BASIC METABOLIC PANEL  URINALYSIS, ROUTINE W REFLEX MICROSCOPIC  HEMOGLOBIN A1C  I-STAT TROPONIN, ED    EKG EKG Interpretation  Date/Time:  Sunday May 15 2018 12:09:39 EST Ventricular Rate:  80 PR Interval:    QRS Duration: 114 QT Interval:  408 QTC Calculation: 471 R Axis:   5 Text Interpretation:  Sinus rhythm Borderline intraventricular conduction delay No significant change since last tracing Confirmed by Linwood DibblesKnapp, Jon [id number]) on 05/15/2018 4:34:57 PM   Radiology Dg Chest 2 View  Result Date: 05/15/2018 CLINICAL DATA:  Anterior chest pain, shortness of breath EXAM: CHEST - 2 VIEW COMPARISON:  12/09/2015 FINDINGS: Previous coronary bypass changes. Mild cardiomegaly without CHF or pneumonia. Lungs remain clear. Negative for edema, effusion or pneumothorax. Trachea is midline. Aorta atherosclerotic. IMPRESSION: Previous coronary bypass changes. Mild cardiomegaly without acute process Electronically Signed   By: Judie PetitM.  Shick M.D.   On: 05/15/2018 13:29    Procedures Procedures (including critical care time)  Medications Ordered in ED Medications  sodium chloride 0.9 % bolus 500 mL (has no administration in time range)  0.9 %  sodium chloride infusion (has no administration in time range)    potassium chloride SA (K-DUR,KLOR-CON) CR tablet 40 mEq (has no administration in time range)  0.9 %  sodium chloride infusion (has no administration in time range)  0.9 %  sodium chloride infusion (has no administration in time range)  dextrose 5 %-0.45 % sodium chloride infusion (has no administration in time range)  insulin regular, human (MYXREDLIN) 100 units/ 100 mL infusion (has no administration in time range)  potassium chloride 10 mEq in 100 mL IVPB (has no administration in time range)  sodium chloride 0.9 %  bolus 500 mL (500 mLs Intravenous New Bag/Given 05/15/18 1402)     Initial Impression / Assessment and Plan / ED Course  I have reviewed the triage vital signs and the nursing notes.  Pertinent labs & imaging results that were available during my care of the patient were reviewed by me and considered in my medical decision making (see chart for details).     Patient with 1 week of progressively worsening generalized weakness, difficulty ambulating, dyspnea on exertion and chest pain on exertion.  Patient is afebrile, hypertensive in the ED initially.  He is nontoxic in appearance.  No focal neurologic deficits but states he feels unsteady when he ambulates.  Chest x-ray shows cardiomegaly without acute process. EKG shows no significant changes from last tracing.  Lab work reviewed by me shows normal troponin, mild anemia.  His creatinine is elevated and his sodium and potassium are low, suggestive of dehydration.  He is also hyperglycemic with an elevated anion gap of 17, is just of a possible DKA.  Doubt ACS/MI, CVA, or PE.  BNP is mildly elevated but no evidence of significant edema on chest x-ray. He is orthostatic. He was given IV fluids but has not yet been able to provide urine specimen. Concern for dehydration in the setting of DKA in a patient who is a poor historian and poorly compliant with his medications. Would benefit from admission. Discussed with Dr. Mariea Clonts who agrees  to assume care of patient and bring him to the hospital for further evaluation and management.   Final Clinical Impressions(s) / ED Diagnoses   Final diagnoses:  Diabetic ketoacidosis without coma associated with type 2 diabetes mellitus [facility])  Dehydration    ED Discharge Orders    None       Jeanie Sewer, PA-C 05/15/18 1641    Loren Racer, MD 05/17/18 2252

## 2018-05-15 NOTE — H&P (Signed)
Patient Demographics:    Mark Fernandez, is a 67 y.o. male  MRN: [id number]   DOB - [birth year]  Admit Date - 05/15/2018  Outpatient Primary MD for the patient is Patient, No Pcp Per   Assessment & Plan:    Principal Problem:   DKA (diabetic ketoacidosis) (HCC) Active Problems:   Orthostatic dizziness   Coronary artery disease   Cardiomyopathy (HCC)   Diabetes (HCC)    1)Mild DKA----as noted in HPI bicarb is 19, glucose 250 cc anion gap 17 patient admits to noncompliance with insulin regimen, start glucose stabilizer per DKA protocol with IV fluids and IV insulin as ordered, switch from NS to D5 per protocol, replace potassium per protocol, UA pending  2)DM2--- uncontrolled, poorly compliant, not currently taking his insulin, check A1c, get diabetic educator, upon discharge patient will need visiting home nurse to help improve medication compliance  3) orthostatic dizziness----remarkable for orthostatic drop in BP, stop amlodipine, IV fluids as ordered, PT eval prior to discharge home  4)H/o CAD--- history of prior MI and prior CABG, last known EF is about 55%, no frank ACS type symptoms at this time, patient with history of stable angina no change in his chest pain pattern, okay to continue aspirin 81 mg daily Lipitor 80 mg every afternoon, Imdur 30 mg daily, reduce Coreg to 6.25 mg twice daily, due to orthostatic concerns  5)Social/Ethics--- patient is a full code, his son lives in [place], his 2 daughters living in [place], patient does not really have anyone around here per se, and 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   6)AKI----acute kidney injury, suspect due to dehydration in the setting of poor oral intake as well as hyperglycemia,     creatinine on  admission= 1.47 ,   baseline creatinine = 1.0   renally adjust medications, avoid nephrotoxic agents /  Dehydration/ hypotension...... hydrate IV and po    7)Disposition--- patient not interested in going to assisted living facility, may benefit from home health RN to help improve medication compliance,  Likely DC to  home in 1 to 2 days after hydration and insulin resumption  With History of - Reviewed by me  Past Medical History:  Diagnosis Date  . Arrhythmia    NSVT  . Bell's palsy   . Cardiomyopathy    EF 45%  . Coronary artery disease    2007 status post CABG X 4  (left internal mammary artery to left anterior descending, saphenous vein graft to diagonal, saphenous  vein graft to obtuse marginal, saphenous vein graft to right coronary   artery).  . Diabetes mellitus    type II  . History of tobacco use   . Hypercholesteremia   . Hypertension   . SVT (supraventricular tachycardia) (HCC)       Past Surgical History:  Procedure Laterality Date  . CARDIAC CATHETERIZATION N/A 12/10/2015   Procedure: Left Heart  Cath and Cors/Grafts Angiography;  Surgeon: Lennette Bihari, MD;  Location: [facility] INVASIVE CV LAB;  Service: Cardiovascular;  Laterality: N/A;  . CATARACT EXTRACTION, BILATERAL    . CHOLECYSTECTOMY    . CORONARY ARTERY BYPASS GRAFT    . LITHOTRIPSY      Chief Complaint  Patient presents with  . Dizziness      HPI:    Mark Fernandez  is a 67 y.o. male reformed smoker with past medical history relevant for noncompliance, CABG 2007 (LIMA-LAD, SVG-D1, SVG-OM, SVG-RCA),  NSVT, SVT, DM, HTN, HLD, remote tob use, NSTEMI 11/2015 w/ SVG-D1 100%>>med rx, EF 50-55% by echo who presents to the ED with about a 10-day history of fatigue, orthostatic dizziness, and just feeling unwell.... Patient came to the ED today because his 2 daughters who live in [place] came to visit him and found him kind of not at his usual state of health  Please see vital signs flowsheet for details of his  orthostatic vitals   Patient has stable angina with intermittent chest discomfort per his admission pattern of chest discomfort has not changed, seen by cardiology team on 04/15/2018 at which time his cardiac medications were resumed  Patient admits to not taking insulin at least for the last few months  No significant increase leg pains no pleuritic symptoms no palpitations... No headaches, no visual disturbance,  In ED... Patient is found to have a glucose of 276, anion gap of 17, with bicarb of 19..... Concerning for mild DKA  Given lab findings suggestive of mild DKA and vital signs reflective of orthostatic concerns EDP gave IV fluids and requested hospitalist admission for further management   Review of systems:    In addition to the HPI above,   A full Review of  Systems was done, all other systems reviewed are negative except as noted above in HPI , .    Social History:  Reviewed by me    Social History   Tobacco Use  . Smoking status: Former Smoker    Last attempt to quit: 02/10/2003    Years since quitting: 15.2  . Smokeless tobacco: Never Used  Substance Use Topics  . Alcohol use: Yes       Family History :  Reviewed by me    Family History  Problem Relation Age of Onset  . Hypertension Mother   . Diabetes Mother       Home Medications:   Prior to Admission medications   Medication Sig Start Date End Date Taking? Authorizing Provider  amLODipine (NORVASC) 10 MG tablet Take 1 tablet (10 mg total) by mouth daily. 04/15/18   Barrett, Joline Salt, PA-C  aspirin EC 81 MG EC tablet Take 1 tablet (81 mg total) by mouth daily. 12/11/15   Arty Baumgartner, NP  atorvastatin (LIPITOR) 80 MG tablet Take 1 tablet (80 mg total) by mouth daily at 6 PM. 04/15/18   Barrett, Joline Salt, PA-C  carvedilol (COREG) 12.5 MG tablet Take 1 tablet (12.5 mg total) by mouth 2 (two) times daily with a meal. 04/15/18   Barrett, Joline Salt, PA-C  HUMULIN 70/30 (70-30) 100 UNIT/ML  injection Inject 100 Units into the skin 2 (two) times daily. 11/05/15   [provider]  isosorbide mononitrate (IMDUR) 30 MG 24 hr tablet Take 1 tablet (30 mg total) by mouth daily. 04/15/18   Barrett, Joline Salt, PA-C  nitroGLYCERIN (NITROSTAT) 0.4 MG SL tablet Place 1 tablet (0.4 mg total) under the tongue every 5 (  five) minutes x 3 doses as needed for chest pain. 04/15/18   Barrett, Joline Salthonda G, PA-C  pantoprazole (PROTONIX) 40 MG tablet Take 1 tablet (40 mg total) by mouth daily. 04/15/18   Barrett, Joline Salthonda G, PA-C     Allergies:    No Known Allergies   Physical Exam:   Vitals  Blood pressure 101/72, pulse 75, temperature 98.4 F (36.9 C), temperature source Oral, resp. rate 20, height 5\' 4"  (1.626 m), weight 98.9 kg, SpO2 98 %.  Physical Examination: General appearance - alert, well appearing, and in no distress and Mental status - alert, oriented to person, place, and time,  Eyes - sclera anicteric Neck - supple, no JVD elevation , Chest - clear  to auscultation bilaterally, symmetrical air movement,  Heart - S1 and S2 normal, regular , CABG scar Abdomen - soft, nontender, nondistended, no masses or organomegaly Neurological - screening mental status exam normal, neck supple without rigidity, cranial nerves II through XII intact, DTR's normal and symmetric, orthostatic dizziness Extremities - no pedal edema noted, intact peripheral pulses  Skin - warm, dry     Data Review:    CBC Recent Labs  Lab 05/15/18 1231  WBC 6.3  HGB 11.6*  HCT 35.2*  PLT 308  MCV 83.6  MCH 27.6  MCHC 33.0  RDW 12.5  LYMPHSABS 1.7  MONOABS 0.9  EOSABS 0.1  BASOSABS 0.0   ------------------------------------------------------------------------------------------------------------------  Chemistries  Recent Labs  Lab 05/15/18 1231  NA 132*  K 3.2*  CL 96*  CO2 19*  GLUCOSE 276*  BUN 15  CREATININE 1.47*  CALCIUM 8.7*    ------------------------------------------------------------------------------------------------------------------ estimated creatinine clearance is 50.4 mL/min (A) (by C-G formula based on SCr of 1.47 mg/dL (H)).     Component Value Date/Time   BNP 105.4 (H) 05/15/2018 1231    Urinalysis    Component Value Date/Time   COLORURINE YELLOW 06/22/2017 1830   APPEARANCEUR CLEAR 06/22/2017 1830   LABSPEC 1.031 (H) 06/22/2017 1830   PHURINE 5.0 06/22/2017 1830   GLUCOSEU >=500 (A) 06/22/2017 1830   HGBUR SMALL (A) 06/22/2017 1830   BILIRUBINUR NEGATIVE 06/22/2017 1830   KETONESUR NEGATIVE 06/22/2017 1830   PROTEINUR NEGATIVE 06/22/2017 1830   NITRITE NEGATIVE 06/22/2017 1830   LEUKOCYTESUR SMALL (A) 06/22/2017 1830     Imaging Results:    Dg Chest 2 View  Result Date: 05/15/2018 CLINICAL DATA:  Anterior chest pain, shortness of breath EXAM: CHEST - 2 VIEW COMPARISON:  12/09/2015 FINDINGS: Previous coronary bypass changes. Mild cardiomegaly without CHF or pneumonia. Lungs remain clear. Negative for edema, effusion or pneumothorax. Trachea is midline. Aorta atherosclerotic. IMPRESSION: Previous coronary bypass changes. Mild cardiomegaly without acute process Electronically Signed   By: Judie PetitM.  Shick M.D.   On: 05/15/2018 13:29    Radiological Exams on Admission: Dg Chest 2 View  Result Date: 05/15/2018 CLINICAL DATA:  Anterior chest pain, shortness of breath EXAM: CHEST - 2 VIEW COMPARISON:  12/09/2015 FINDINGS: Previous coronary bypass changes. Mild cardiomegaly without CHF or pneumonia. Lungs remain clear. Negative for edema, effusion or pneumothorax. Trachea is midline. Aorta atherosclerotic. IMPRESSION: Previous coronary bypass changes. Mild cardiomegaly without acute process Electronically Signed   By: Judie PetitM.  Shick M.D.   On: 05/15/2018 13:29    DVT Prophylaxis -SCD/Heparin AM Labs Ordered, also please review Full Orders  Family Communication: Admission, patients condition and plan  of care including tests being ordered have been discussed with the patient and daughters x 2 who indicate understanding and agree with  the plan   Code Status - Full Code  Likely DC to  home in 1 to 2 days after hydration and insulin resumption  Condition   stable  Shon Hale M.D on 05/15/2018 at 5:44 PM Pager---[phone] Go to [email or web address] - [email or web address] for contact info  [facility] - Office  [phone]

## 2018-05-15 NOTE — ED Notes (Signed)
Pt's CBG result was 215. Informed Thayer Ohmhris - RN.

## 2018-05-15 NOTE — ED Notes (Signed)
reporft given gto nichole

## 2018-05-15 NOTE — ED Notes (Signed)
Patient transported to X-ray 

## 2018-05-15 NOTE — ED Notes (Signed)
Patient ambulated using a walker and maintained oxygen saturation of 98% or higher. Denies any dizziness.

## 2018-05-15 NOTE — ED Notes (Signed)
Pt bladder scanned. Highest amount scanned was 202. Informed Thayer Ohmhris - RN.

## 2018-05-16 DIAGNOSIS — R0602 Shortness of breath: Secondary | ICD-10-CM

## 2018-05-16 DIAGNOSIS — R42 Dizziness and giddiness: Secondary | ICD-10-CM

## 2018-05-16 DIAGNOSIS — E1110 Type 2 diabetes mellitus with ketoacidosis without coma: Secondary | ICD-10-CM | POA: Diagnosis not present

## 2018-05-16 LAB — GLUCOSE, CAPILLARY
GLUCOSE-CAPILLARY: 141 mg/dL — AB (ref 70–99)
GLUCOSE-CAPILLARY: 89 mg/dL (ref 70–99)
Glucose-Capillary: 188 mg/dL — ABNORMAL HIGH (ref 70–99)
Glucose-Capillary: 157 mg/dL — ABNORMAL HIGH (ref 70–99)
Glucose-Capillary: 99 mg/dL (ref 70–99)
Glucose-Capillary: 112 mg/dL — ABNORMAL HIGH (ref 70–99)

## 2018-05-16 LAB — BASIC METABOLIC PANEL
ANION GAP: 10 (ref 5–15)
Anion gap: 11 (ref 5–15)
BUN: 12 mg/dL (ref 8–23)
BUN: 13 mg/dL (ref 8–23)
CHLORIDE: 102 mmol/L (ref 98–111)
CHLORIDE: 100 mmol/L (ref 98–111)
CO2: 23 mmol/L (ref 22–32)
CO2: 25 mmol/L (ref 22–32)
CREATININE: 1.34 mg/dL — AB (ref 0.61–1.24)
Calcium: 8.6 mg/dL — ABNORMAL LOW (ref 8.9–10.3)
Calcium: 8.5 mg/dL — ABNORMAL LOW (ref 8.9–10.3)
Creatinine, Ser: 1.35 mg/dL — ABNORMAL HIGH (ref 0.61–1.24)
GFR calc Af Amer: >60 mL/min (ref >60)
GFR calc non Af Amer: 52 mL/min — ABNORMAL LOW (ref >60)
GFR calc non Af Amer: 52 mL/min — ABNORMAL LOW (ref >60)
GLUCOSE: 172 mg/dL — AB (ref 70–99)
GLUCOSE: 160 mg/dL — AB (ref 70–99)
POTASSIUM: 2.8 mmol/L — AB (ref 3.5–5.1)
Potassium: 3.4 mmol/L — ABNORMAL LOW (ref 3.5–5.1)
Sodium: 135 mmol/L (ref 135–145)
Sodium: 136 mmol/L (ref 135–145)

## 2018-05-16 LAB — CBC
HEMATOCRIT: 32.4 % — AB (ref 39.0–52.0)
Hemoglobin: 10.9 g/dL — ABNORMAL LOW (ref 13.0–17.0)
MCH: 27.8 pg (ref 26.0–34.0)
MCHC: 33.6 g/dL (ref 30.0–36.0)
MCV: 82.7 fL (ref 80.0–100.0)
NRBC: 0 % (ref 0.0–0.2)
Platelets: 313 10*3/uL (ref 150–400)
RBC: 3.92 MIL/uL — ABNORMAL LOW (ref 4.22–5.81)
RDW: 12.4 % (ref 11.5–15.5)
WBC: 6 10*3/uL (ref 4.0–10.5)

## 2018-05-16 LAB — MAGNESIUM: MAGNESIUM: 1.8 mg/dL (ref 1.7–2.4)

## 2018-05-16 LAB — HIV ANTIBODY (ROUTINE TESTING W REFLEX): HIV Screen 4th Generation wRfx: NONREACTIVE

## 2018-05-16 MED ORDER — LACTATED RINGERS IV SOLN
INTRAVENOUS | Status: AC
  Administered 2018-05-16: 1000 mL via INTRAVENOUS

## 2018-05-16 MED ORDER — MAGNESIUM SULFATE IN D5W 1-5 GM/100ML-% IV SOLN
1.0000 g | Freq: Once | INTRAVENOUS | Status: AC
  Administered 2018-05-16: 1 g via INTRAVENOUS
  Filled 2018-05-16: qty 100

## 2018-05-16 MED ORDER — LACTATED RINGERS IV BOLUS: 1000.0000 mL | Freq: Once | INTRAVENOUS | Status: DC

## 2018-05-16 MED ORDER — POTASSIUM CHLORIDE CRYS ER 20 MEQ PO TBCR
40.0000 meq | EXTENDED_RELEASE_TABLET | Freq: Once | ORAL | Status: AC
  Administered 2018-05-16: 40 meq via ORAL
  Filled 2018-05-16: qty 2

## 2018-05-16 MED ORDER — LACTATED RINGERS IV SOLN: INTRAVENOUS | Status: DC

## 2018-05-16 MED ORDER — INSULIN ASPART PROT & ASPART (70-30 MIX) 100 UNIT/ML ~~LOC~~ SUSP
35.0000 [IU] | Freq: Two times a day (BID) | SUBCUTANEOUS | Status: DC
  Administered 2018-05-16 – 2018-05-17 (×3): 35 [IU] via SUBCUTANEOUS
  Filled 2018-05-16: qty 10

## 2018-05-16 MED ORDER — POTASSIUM CHLORIDE 10 MEQ/100ML IV SOLN
10.0000 meq | INTRAVENOUS | Status: DC
  Filled 2018-05-16 (×2): qty 100

## 2018-05-16 MED ORDER — LACTATED RINGERS IV BOLUS
1000.0000 mL | Freq: Once | INTRAVENOUS | Status: AC
  Administered 2018-05-16: 1000 mL via INTRAVENOUS

## 2018-05-16 MED ORDER — MORPHINE SULFATE (PF) 2 MG/ML IV SOLN
2.0000 mg | Freq: Once | INTRAVENOUS | Status: AC
  Administered 2018-05-16: 2 mg via INTRAVENOUS
  Filled 2018-05-16: qty 1

## 2018-05-16 NOTE — Care Management Obs Status (Signed)
MEDICARE OBSERVATION STATUS NOTIFICATION   Patient Details  Name: Mark Fernandez MRN: [id number] Date of Birth: 09-24-1950   Medicare Observation Status Notification Given:  Yes    Epifanio LeschesCole, Socorro Kanitz Hudson, RN 05/16/2018, 12:09 PM

## 2018-05-16 NOTE — Progress Notes (Addendum)
This RN entered room. Patient resting in bed with eyes closed, diaphoretic. Temp 98.9 post tylenol given for temp of 102.3. Patient was standing at the sink with assistance to bathe. Patient became very dizzy and needed moderate assistance to get back to bed. Patient sat on edge of bed with slow improvement, then laid down in bed. Patient remained dizzy and diaphoretic. BG 188. VS after patient back in bed per flowsheet. Patient reminded to not get out of bed unassisted, but needs to stay in bed unless necessary to get up. Urinal at bedside for patient. Will continue to monitor.

## 2018-05-16 NOTE — Care Management Note (Addendum)
Case Management Note  Patient Details  Name: Mark ConnJames C Santoni MRN: [id number] Date of Birth: [birth year]  Subjective/Objective:     DKA (diabetic ketoacidosis. Pt from home alone.[place], his 2 daughters living in [place]. States uses cane with ambulation.      Link SnufferAnnie Varden (Daughter) Eustace Peneresa Bevans (Daughter)    [phone]     PCP: Pt states PCP/ Endocrine MD retired a year ago, has no PCP.  05/17/2018 @ 11:52 am  - Pt declining SNF placement. Agreeable to home health services. Pattricia Bossnnie ( daughter)  to assist with care once d/c and ensure dad has 24/7 supervision which [facility].   Action/Plan: Pt evaluation pending... [facility]  [facility]  [facility]   [phone] [address] [place] [address]    Next Steps: Go on 05/23/2018    Instructions: 2:30 pm, Dr. Cain Saupeammie Fulp     Pt's daughter Pattricia Bossnnie to provide transportation to home.  Expected Discharge Date:  05/18/2018              Expected Discharge Plan:  Home w Home Health Services  In-House Referral:  Clinical Social Work  Discharge planning Services  CM Consult  Post Acute Care Choice:    Choice offered to:  Patient  DME Arranged:   rolling walker DME Agency:   [facility]  HH Arranged:    RN,PT HH Agency:   [facility]  Status of Service:  completed  If discussed at Long Length of Stay Meetings, dates discussed:    Additional Comments:  Epifanio LeschesCole, Mialani Reicks Hudson, RN 05/16/2018, 11:43 AM

## 2018-05-16 NOTE — Discharge Summary (Addendum)
Mark Fernandez:[id number] DOB: [birth year] DOA: 05/15/2018  PCP: Patient, No Pcp Per  Admit date: 05/15/2018  Discharge date: 05/18/2018  Admitted From: Home  Disposition:  Home   Recommendations for Outpatient Follow-up:   Follow up with PCP in 1-2 weeks  PCP Please obtain BMP/CBC, 2 view CXR in 1week,  (see Discharge instructions)   PCP Please follow up on the following pending results:    Home Health: PT,RN,Aide, -work Equipment/Devices: walker Consultations: None Discharge Condition: Fair   CODE STATUS: Full   Diet Recommendation: Heart Healthy Low Carb    Chief Complaint  Patient presents with  . Dizziness     Brief history of present illness from the day of admission and additional interim summary    Mark Fernandez  is a 67 y.o. male reformed smoker with past medical history relevant for noncompliance, CABG 2007 (LIMA-LAD, SVG-D1, SVG-OM, SVG-RCA),  NSVT, SVT, DM, HTN, HLD, remote tob use, NSTEMI 11/2015 w/ SVG-D1 100%>>med rx, EF 50-55% by echo who presents to the ED with about a 10-day history of fatigue, orthostatic dizziness, and just feeling unwell, he was diagnosed with mild DKA due to noncompliance with his insulin and admitted for further treatment.                                                                 Hospital Course    1.  Mild DKA in a patient with DM type II which is in poor outpatient control A1c of close to 12.5.  He is noncompliant with his insulin and says he missed a few doses before coming to the hospital, he was counseled on compliance, he was treated with DKA protocol which included IV fluids and IV insulin, DKA has completely resolved his anion gap is closed, he feels overall much better, will hydrate him with another 1 L of fluid thereafter ambulate him and if he  does well discharge home.  His blood pressures have stabilized.  2.  Orthostatic hypotension due to dehydration from #1 above.  Resolved.  Continue with his home dose beta-blocker discontinued Norvasc at this time upon discharge.  3.  CAD.  No acute issue.  Continue aspirin, beta-blocker, Imdur and statin for secondary prevention.  4.  Dyslipidemia.  On statin continue.  5.  Mild ARF due to dehydration from DKA.  Good urine output, renal function stable, give another liter of fluid prior to discharge, PCP to monitor renal function in the outpatient setting.  He had mild hypokalemia this morning which was replaced.  6. Ur. Retention - resolved with Flomax, continue.  Discharge diagnosis     Principal Problem:   DKA (diabetic ketoacidosis) (HCC) Active Problems:   Coronary artery disease   Cardiomyopathy (HCC)   Diabetes (HCC)   Orthostatic dizziness    Discharge  instructions    Discharge Instructions    Diet - low sodium heart healthy   Complete by:  As directed    Discharge instructions   Complete by:  As directed    Follow with Primary MD  in 7 days   Get CBC, CMP, 2 view Chest X ray -  checked  by Primary MD in 5-7 days    Activity: As tolerated with Full fall precautions use walker/cane & assistance as needed  Disposition Home    Diet: Heart Healthy Low Carb  Accuchecks 4 times/day, Once in AM empty stomach and then before each meal. Log in all results and show them to your Prim.MD in 3 days. If any glucose reading is under 80 or above 300 call your Prim MD immidiately. Follow Low glucose instructions for glucose under 80 as instructed.   Special Instructions: If you have smoked or chewed Tobacco  in the last 2 yrs please stop smoking, stop any regular Alcohol  and or any Recreational drug use.  On your next visit with your primary care physician please Get Medicines reviewed and adjusted.  Please request your Prim.MD to go over all Hospital Tests and  Procedure/Radiological results at the follow up, please get all Hospital records sent to your Prim MD by signing hospital release before you go home.  If you experience worsening of your admission symptoms, develop shortness of breath, life threatening emergency, suicidal or homicidal thoughts you must seek medical attention immediately by calling 911 or calling your MD immediately  if symptoms less severe.  You Must read complete instructions/literature along with all the possible adverse reactions/side effects for all the Medicines you take and that have been prescribed to you. Take any new Medicines after you have completely understood and accpet all the possible adverse reactions/side effects.   Increase activity slowly   Complete by:  As directed       Discharge Medications   Allergies as of 05/18/2018   No Known Allergies     Medication List    STOP taking these medications   amLODipine 10 MG tablet Commonly known as:  NORVASC     TAKE these medications   aspirin 81 MG EC tablet Take 1 tablet (81 mg total) by mouth daily.   atorvastatin 80 MG tablet Commonly known as:  LIPITOR Take 1 tablet (80 mg total) by mouth daily at 6 PM.   carvedilol 12.5 MG tablet Commonly known as:  COREG Take 1 tablet (12.5 mg total) by mouth 2 (two) times daily with a meal.   HUMULIN 70/30 (70-30) 100 UNIT/ML injection Generic drug:  insulin NPH-regular Human Inject 45 Units into the skin 2 (two) times daily.   isosorbide mononitrate 30 MG 24 hr tablet Commonly known as:  IMDUR Take 1 tablet (30 mg total) by mouth daily.   nitroGLYCERIN 0.4 MG SL tablet Commonly known as:  NITROSTAT Place 1 tablet (0.4 mg total) under the tongue every 5 (five) minutes x 3 doses as needed for chest pain.   pantoprazole 40 MG tablet Commonly known as:  PROTONIX Take 1 tablet (40 mg total) by mouth daily.   tamsulosin 0.4 MG Caps capsule Commonly known as:  FLOMAX Take 1 capsule (0.4 mg total) by  mouth daily after supper.            Durable Medical Equipment  (From admission, onward)         Start     Ordered   05/18/18 1251  For  home use only DME Walker rolling  Once    Comments:  5 wheel  Question:  Patient needs a walker to treat with the following condition  Answer:  Weakness   05/18/18 1250   05/17/18 1202  For home use only DME Walker rolling  Once    Question:  Patient needs a walker to treat with the following condition  Answer:  Weakness   05/17/18 1202          Follow-up Information    Your PCP. Schedule an appointment as soon as possible for a visit in 2 day(s).        [facility]. Go on 05/23/2018.   Why:  2:30 pm, Dr. Serafina Royals Contact information: [address] [place] [address] [phone]       [facility] Follow up.   Specialty:  Home Health Services Why:  home health services arranged Contact information: [address] [place] [address] [phone]           Major procedures and Radiology Reports - PLEASE review detailed and final reports thoroughly  -      Dg Chest 2 View  Result Date: 05/15/2018 CLINICAL DATA:  Anterior chest pain, shortness of breath EXAM: CHEST - 2 VIEW COMPARISON:  12/09/2015 FINDINGS: Previous coronary bypass changes. Mild cardiomegaly without CHF or pneumonia. Lungs remain clear. Negative for edema, effusion or pneumothorax. Trachea is midline. Aorta atherosclerotic. IMPRESSION: Previous coronary bypass changes. Mild cardiomegaly without acute process Electronically Signed   By: Judie Petit.  Shick M.D.   On: 05/15/2018 13:29   Dg Chest Port 1 View  Result Date: 05/18/2018 CLINICAL DATA:  Shortness of breath EXAM: PORTABLE CHEST 1 VIEW COMPARISON:  Three days ago FINDINGS: Normal heart size and mediastinal contours. Status post CABG no acute infiltrate or edema. No effusion or pneumothorax. No acute osseous findings. IMPRESSION:  No evidence of active disease. Electronically Signed   By: Marnee Spring M.D.   On: 05/18/2018 08:47    Micro Results    No results found for this or any previous visit (from the past 240 hour(s)).  Today   Subjective    Shady Bradish today has no headache,no chest abdominal pain,no new weakness tingling or numbness, feels much better wants to go home today.    Objective   Blood pressure 140/86, pulse 69, temperature (!) 97.4 F (36.3 C), temperature source Oral, resp. rate 18, height 5\' 4"  (1.626 m), weight 87.5 kg, SpO2 99 %.   Intake/Output Summary (Last 24 hours) at 05/18/2018 1251 Last data filed at 05/18/2018 0900 Gross per 24 hour  Intake 1191.1 ml  Output 2300 ml  Net -1108.9 ml    Exam Awake Alert, Oriented x 3, No new F.N deficits, Normal affect [place].AT,PERRAL Supple Neck,No JVD, No cervical lymphadenopathy appriciated.  Symmetrical Chest wall movement, Good air movement bilaterally, CTAB RRR,No Gallops,Rubs or new Murmurs, No Parasternal Heave +ve B.Sounds, Abd Soft, Non tender, No organomegaly appriciated, No rebound -guarding or rigidity. No Cyanosis, Clubbing or edema, No new Rash or bruise   Data Review   CBC w Diff :   Lab Results  Component Value Date   WBC 5.1 05/18/2018   HGB 10.0 (L) 05/18/2018   HGB 12.1 (L) 04/15/2018   HCT 31.1 (L) 05/18/2018   HCT 36.4 (L) 04/15/2018   PLT 359 05/18/2018   PLT 329 04/15/2018   LYMPHOPCT 27 05/15/2018   MONOPCT 15 05/15/2018  EOSPCT 1 05/15/2018   BASOPCT 0 05/15/2018    CMP :   Lab Results  Component Value Date   NA 139 05/18/2018   NA 136 04/15/2018   K 3.3 (L) 05/18/2018   CL 105 05/18/2018   CO2 24 05/18/2018   BUN 6 (L) 05/18/2018   BUN 10 04/15/2018   CREATININE 1.22 05/18/2018   PROT 7.3 04/15/2018   ALBUMIN 4.0 04/15/2018   BILITOT 0.7 04/15/2018   ALKPHOS 69 04/15/2018   AST 12 04/15/2018   ALT 7 04/15/2018  .   Total Time in preparing paper work, data evaluation and todays  exam - 35 minutes  Susa RaringPrashant Jemima Petko M.D on 05/18/2018 at 12:51 PM  [facility]   Office  [phone]

## 2018-05-16 NOTE — Progress Notes (Signed)
Inpatient Diabetes Program Recommendations  AACE/ADA: New Consensus Statement on Inpatient Glycemic Control (2015)  Target Ranges:  Prepandial:   less than 140 mg/dL      Peak postprandial:   less than 180 mg/dL (1-2 hours)      Critically ill patients:  140 - 180 mg/dL   Lab Results  Component Value Date   GLUCAP 99 05/16/2018   HGBA1C 12.3 (H) 05/15/2018    Review of Glycemic Control Results for Mark Fernandez, Mark Fernandez (MRN [id number]) as of 05/16/2018 14:30  Ref. Range 05/16/2018 04:36 05/16/2018 08:39 05/16/2018 12:48  Glucose-Capillary Latest Ref Range: 70 - 99 mg/dL 409141 (H) 811157 (H) 99   Diabetes history: Type 2 DM Outpatient Diabetes medications: Humulin 70/30- 45 units BID Current orders for Inpatient glycemic control: Novolog 70/30 35 units BID, Novolog 0-24 units Q4H  Inpatient Diabetes Program Recommendations:    Spoke with patient regarding outpatient management of DM. Patient states, "My doctor retired, and when he quit taking care of my diabetes, so did I. That was a little over a year ago. Now I know I have to get back to my old routine and take my medicine the way I was."   Reviewed patient's current A1c of 12.3% which is up from 10.9%. Explained what a A1c is and what it measures. Also reviewed goal A1c with patient, importance of good glucose control @ home, and blood sugar goals. Reviewed patho of DKA, DM, role of pancreas, vascular changes and comorbidites.  Patient has a meter and supplies. Encouraged to check CBG's 2 times per day and report these values back to PCP. Discussed value of a PCP and establishing care with a new physician. He has an appointment with [facility] and plans to follow up.  Patient has no further questions regarding DM at this time.   Thanks, Lujean RaveLauren Iyani Dresner, MSN, RNC-OB Diabetes Coordinator [phone] (8a-5p)

## 2018-05-16 NOTE — Progress Notes (Signed)
Bodenheimer, NP notified of patient's episode and patient now c/o abd pain. Bodenheimer, NP agrees with patient remaining in bed at this time. Continue to monitor dizziness. Order being put in for pain medication. Will continue to monitor.

## 2018-05-16 NOTE — Progress Notes (Signed)
PT  Note  Patient Details Name: Lucius ConnJames C Labrum MRN: [id number] DOB: 07/22/1950    Orders received, chart reviewed; Noted difficult night last night;   Will plan for PT evaluation in the afternoon;   Van ClinesHolly Fitzgerald Dunne, PT  Acute Rehabilitation Services Pager [phone] Office [phone]    Levi AlandHolly H Adonys Wildes 05/16/2018, 8:52 AM

## 2018-05-16 NOTE — Discharge Instructions (Signed)
Follow with Primary MD  in 7 days   Get CBC, CMP, 2 view Chest X ray -  checked  by Primary MD in 5-7 days    Activity: As tolerated with Full fall precautions use walker/cane & assistance as needed  Disposition Home    Diet: Heart Healthy Low Carb  Accuchecks 4 times/day, Once in AM empty stomach and then before each meal. Log in all results and show them to your Prim.MD in 3 days. If any glucose reading is under 80 or above 300 call your Prim MD immidiately. Follow Low glucose instructions for glucose under 80 as instructed.   Special Instructions: If you have smoked or chewed Tobacco  in the last 2 yrs please stop smoking, stop any regular Alcohol  and or any Recreational drug use.  On your next visit with your primary care physician please Get Medicines reviewed and adjusted.  Please request your Prim.MD to go over all Hospital Tests and Procedure/Radiological results at the follow up, please get all Hospital records sent to your Prim MD by signing hospital release before you go home.  If you experience worsening of your admission symptoms, develop shortness of breath, life threatening emergency, suicidal or homicidal thoughts you must seek medical attention immediately by calling 911 or calling your MD immediately  if symptoms less severe.  You Must read complete instructions/literature along with all the possible adverse reactions/side effects for all the Medicines you take and that have been prescribed to you. Take any new Medicines after you have completely understood and accpet all the possible adverse reactions/side effects.

## 2018-05-16 NOTE — Evaluation (Signed)
Physical Therapy Evaluation Patient Details Name: Mark Fernandez MRN: [id number] DOB: 11/30/1950 Today's Date: 05/16/2018   History of Present Illness  Mark Fernandez  is a 67 y.o. male reformed smoker with past medical history relevant for noncompliance,  CABG 2007 (LIMA-LAD, SVG-D1, SVG-OM, SVG-RCA),  NSVT, SVT, DM, HTN, HLD, remote tob use, NSTEMI 11/2015 w/ SVG-D1 100%>>med rx, EF 50-55% by echo who presents to the ED with about a 10-day history of fatigue, orthostatic dizziness, and just feeling unwell.... Patient came to the ED 11/24 because his 2 daughters who live in [place] came to visit him and found him kind of not at his usual state of health  Clinical Impression   Pt admitted with above diagnosis. Pt currently with functional limitations due to the deficits listed below (see PT Problem List). Independent with assistive device prior to admission; describes general decline over past 1-2 months leading to this admission; Presents with gait and balance dysfunction; Noted also difficulty with x1 VOR screen -- given balance deficits and unsteadiness with amb today, Will request a vestibular specialist to see him next session; I'm concerned for his safety at home alone;  Pt will benefit from skilled PT to increase their independence and safety with mobility to allow discharge to the venue listed below.       Follow Up Recommendations SNF;Supervision/Assistance - 24 hour;Other (comment)(Will monitor for progress)    Equipment Recommendations  Rolling walker with 5" wheels;3in1 (PT)    Recommendations for Other Services Other (comment)(Vestibular evaluation)     Precautions / Restrictions Precautions Precautions: Fall      Mobility  Bed Mobility Overal bed mobility: Modified Independent                Transfers Overall transfer level: Needs assistance Equipment used: None Transfers: Sit to/from Stand Sit to Stand: Min assist         General transfer comment: Min  assist to steady; dependent on UE support  Ambulation/Gait Ambulation/Gait assistance: Mod assist Gait Distance (Feet): 40 Feet(x2) Assistive device: 1 person hand held assist;Rolling walker (2 wheeled) Gait Pattern/deviations: Step-through pattern;Scissoring;Staggering left;Staggering right Gait velocity: slow   General Gait Details: Mod assist for balance initially, with hallway rail used and hand held assist; erratic step width with one loss of balance requiring heavy mod assist to prevent fall; second bout of walking with RW, min assist to steady  Stairs            Wheelchair Mobility    Modified Rankin (Stroke Patients Only)       Balance Overall balance assessment: Needs assistance Sitting-balance support: No upper extremity supported Sitting balance-Leahy Scale: Fair     Standing balance support: During functional activity Standing balance-Leahy Scale: Poor                               Pertinent Vitals/Pain Pain Assessment: No/denies pain    Home Living Family/patient expects to be discharged to:: Private residence Living Arrangements: Alone Available Help at Discharge: Other (Comment)(not much assist available to him) Type of Home: House Home Access: Stairs to enter Entrance Stairs-Rails: Left(back entrance) Entrance Stairs-Number of Steps: 2 Home Layout: One level        Prior Function Level of Independence: Independent with assistive device(s)         Comments: Uses cane prn     Hand Dominance        Extremity/Trunk Assessment   Upper Extremity  Assessment Upper Extremity Assessment: Overall WFL for tasks assessed    Lower Extremity Assessment Lower Extremity Assessment: Generalized weakness       Communication   Communication: No difficulties  Cognition Arousal/Alertness: Awake/alert Behavior During Therapy: WFL for tasks assessed/performed Overall Cognitive Status: Impaired/Different from baseline Area of  Impairment: Safety/judgement                         Safety/Judgement: Decreased awareness of safety;Decreased awareness of deficits            General Comments General comments (skin integrity, edema, etc.): See also vitals flowsheet for orthostatics    Exercises     Assessment/Plan    PT Assessment Patient needs continued PT services  PT Problem List Decreased strength;Decreased activity tolerance;Decreased balance;Decreased mobility;Decreased coordination;Decreased cognition;Decreased knowledge of use of DME;Decreased safety awareness;Decreased knowledge of precautions       PT Treatment Interventions DME instruction;Gait training;Stair training;Functional mobility training;Therapeutic activities;Therapeutic exercise;Balance training;Neuromuscular re-education;Cognitive remediation;Patient/family education    PT Goals (Current goals can be found in the Care Plan section)  Acute Rehab PT Goals Patient Stated Goal: hopes to get home soon PT Goal Formulation: With patient Time For Goal Achievement: 05/30/18    Frequency Min 3X/week   Barriers to discharge Decreased caregiver support Must be managing independently to dc home    Co-evaluation               AM-PAC PT "6 Clicks" Mobility  Outcome Measure Help needed turning from your back to your side while in a flat bed without using bedrails?: None Help needed moving from lying on your back to sitting on the side of a flat bed without using bedrails?: None Help needed moving to and from a bed to a chair (including a wheelchair)?: A Little Help needed standing up from a chair using your arms (e.g., wheelchair or bedside chair)?: A Little Help needed to walk in hospital room?: A Lot Help needed climbing 3-5 steps with a railing? : Total 6 Click Score: 17    End of Session Equipment Utilized During Treatment: Gait belt Activity Tolerance: Patient tolerated treatment well Patient left: in bed;with call  bell/phone within reach;with bed alarm set Nurse Communication: Mobility status PT Visit Diagnosis: Unsteadiness on feet (R26.81);Other abnormalities of gait and mobility (R26.89);Muscle weakness (generalized) (M62.81);Difficulty in walking, not elsewhere classified (R26.2)    Time: 0981-1914 PT Time Calculation (min) (ACUTE ONLY): 24 min   Charges:   PT Evaluation $PT Eval Moderate Complexity: 1 Mod PT Treatments $Gait Training: 8-22 mins        Van Clines, PT  Acute Rehabilitation Services Pager [phone] Office [phone]   Levi Aland 05/16/2018, 4:27 PM

## 2018-05-16 NOTE — Progress Notes (Signed)
D/C home order was canceled per MD. Will continue to monitor.

## 2018-05-16 NOTE — Progress Notes (Signed)
Patient waiting for PT to evaluate him before he is D/C home. MD aware. Will continue to monitor.

## 2018-05-17 DIAGNOSIS — I25708 Atherosclerosis of coronary artery bypass graft(s), unspecified, with other forms of angina pectoris: Secondary | ICD-10-CM | POA: Diagnosis not present

## 2018-05-17 LAB — BASIC METABOLIC PANEL
ANION GAP: 10 (ref 5–15)
BUN: 9 mg/dL (ref 8–23)
CO2: 22 mmol/L (ref 22–32)
Calcium: 8.7 mg/dL — ABNORMAL LOW (ref 8.9–10.3)
Chloride: 105 mmol/L (ref 98–111)
Creatinine, Ser: 1.14 mg/dL (ref 0.61–1.24)
GFR calc Af Amer: >60 mL/min (ref >60)
GFR calc non Af Amer: >60 mL/min (ref >60)
GLUCOSE: 93 mg/dL (ref 70–99)
Potassium: 3.3 mmol/L — ABNORMAL LOW (ref 3.5–5.1)
Sodium: 137 mmol/L (ref 135–145)

## 2018-05-17 LAB — GLUCOSE, CAPILLARY
GLUCOSE-CAPILLARY: 75 mg/dL (ref 70–99)
GLUCOSE-CAPILLARY: 77 mg/dL (ref 70–99)
GLUCOSE-CAPILLARY: 84 mg/dL (ref 70–99)
Glucose-Capillary: 89 mg/dL (ref 70–99)
Glucose-Capillary: 109 mg/dL — ABNORMAL HIGH (ref 70–99)
Glucose-Capillary: 69 mg/dL — ABNORMAL LOW (ref 70–99)
Glucose-Capillary: 82 mg/dL (ref 70–99)

## 2018-05-17 LAB — MAGNESIUM: Magnesium: 2 mg/dL (ref 1.7–2.4)

## 2018-05-17 MED ORDER — LACTATED RINGERS IV SOLN
INTRAVENOUS | Status: AC
  Administered 2018-05-17: 11:00:00 via INTRAVENOUS

## 2018-05-17 MED ORDER — POTASSIUM CHLORIDE CRYS ER 20 MEQ PO TBCR
40.0000 meq | EXTENDED_RELEASE_TABLET | Freq: Once | ORAL | Status: AC
  Administered 2018-05-17: 40 meq via ORAL
  Filled 2018-05-17: qty 2

## 2018-05-17 MED ORDER — INSULIN ASPART 100 UNIT/ML ~~LOC~~ SOLN: 0.0000 [IU] | Freq: Three times a day (TID) | SUBCUTANEOUS | Status: DC

## 2018-05-17 MED ORDER — INSULIN ASPART PROT & ASPART (70-30 MIX) 100 UNIT/ML ~~LOC~~ SUSP
30.0000 [IU] | Freq: Two times a day (BID) | SUBCUTANEOUS | Status: DC
  Administered 2018-05-17 – 2018-05-18 (×2): 30 [IU] via SUBCUTANEOUS

## 2018-05-17 NOTE — Progress Notes (Signed)
Physical Therapy Treatment/Vestibular Assessment Patient Details Name: Mark Fernandez MRN: [id number] DOB: 11/16/1950 Today's Date: 05/17/2018    History of Present Illness Mark Fernandez  is a 67 y.o. male reformed smoker with past medical history relevant for noncompliance,  CABG 2007, NSVT, SVT, DM, HTN, HLD, remote tob use, NSTEMI 11/2015, EF 50-55%, who presents to the ED with about a 10-day history of fatigue, orthostatic dizziness, and just feeling unwell.  Pt dx with orthostatic hypotension due to dehydration, mild DKA with poor control of DM II, and mild acute renal failure.      PT Comments    Vestibular assessment complete and pt shows some signs of R ear hypofunction with left beating nystagmus with left gaze and difficulty with gaze stability testing.  I had to hold his eye lids up to get a good look at his smooth pursuits due to his dark pupils and ptosis in his left eye due to h/o Bell's Palsy.  Pt does not have an associated hearing loss, but is unstable on his feet especially with head turns.  The walker helps, but for now he would benefit from some close supervision (from his daughter) to reduce his fall risk.  X1 gaze stability HEP given and will need to be reviewed.  I am hopeful he can have follow up with a vestibular trained home PT at discharge.  [employer] HEP access code:[email or web address] .   PT to follow acutely for deficits listed below.     Follow Up Recommendations  Home health PT;Supervision for mobility/OOB(Home health vestibular PT if able)     Equipment Recommendations  Rolling walker with 5" wheels;3in1 (PT)    Recommendations for Other Services   NA     Precautions / Restrictions Precautions Precautions: Fall Precaution Comments: unsteady on his feet    Mobility  Bed Mobility Overal bed mobility: Modified Independent             General bed mobility comments: HOB elevated, extra time, using rails  Transfers Overall transfer level: Needs  assistance Equipment used: Rolling walker (2 wheeled) Transfers: Sit to/from Stand Sit to Stand: Min guard         General transfer comment: Min guard assist for safety during transitions.   Ambulation/Gait Ambulation/Gait assistance: Min guard Gait Distance (Feet): 180 Feet Assistive device: Rolling walker (2 wheeled) Gait Pattern/deviations: Step-through pattern;Staggering left;Staggering right Gait velocity: decreased Gait velocity interpretation: 1.31 - 2.62 ft/sec, indicative of limited community ambulator General Gait Details: Pt unable to preform head turns without staggering and tipping his walker up on two points.  Verbal cues for closer proximity to RW and upright posture.            Balance Overall balance assessment: Needs assistance Sitting-balance support: Feet supported;No upper extremity supported Sitting balance-Leahy Scale: Good     Standing balance support: Bilateral upper extremity supported Standing balance-Leahy Scale: Poor Standing balance comment: needs support from RW and therapist.          05/17/18 1843  Vestibular Assessment  General Observation Pt wears glasses to see, has h/o cataract surgery bil, no blurry or dobule vision, h/o bells' palsey L side since the 90s, no tinnitus or fullness, no hearing loss, no recent head trauma or whiplash injury, no recent URI/sinus drainage, no dizziness when having a BM.  Just "swimmy headed" when he gets up and moves around.    Symptom Behavior  Type of Dizziness  (swimmy headed)  Frequency of Dizziness when up and moving (intermittent)  Duration of Dizziness long  Aggravating Factors Activity in general;Turning head sideways  Relieving Factors Lying supine;Rest  Occulomotor Exam  Occulomotor Alignment Abnormal (difficult to truely say due to L eye ptosis)  Spontaneous Absent  Gaze-induced Left beating nystagmus with L gaze  Smooth Pursuits Saccades  Saccades Comment (overshoot with 2 large catch  up saccades)  Vestibulo-Occular Reflex  VOR 1 Head Only (x 1 viewing) difficulty with vertical more than horizontal  Comment skew test negative, HIT negative, but difficult to preform as he jumped with the quick flicks  Auditory  Comments grossly equal bil with finger rub.  Other Tests  Comments per pt report no dizziness when having a BM/vagal  Positional Testing  Dix-Hallpike Dix-Hallpike Right;Dix-Hallpike Left  Horizontal Canal Testing Horizontal Canal Right;Horizontal Canal Left  Dix-Hallpike Right  Dix-Hallpike Right Duration 0  Dix-Hallpike Right Symptoms No nystagmus  Dix-Hallpike Left  Dix-Hallpike Left Duration 0  Dix-Hallpike Left Symptoms No nystagmus  Horizontal Canal Right  Horizontal Canal Right Duration 0  Horizontal Canal Right Symptoms Normal  Horizontal Canal Left  Horizontal Canal Left Duration 0  Horizontal Canal Left Symptoms Normal                         Cognition Arousal/Alertness: Awake/alert Behavior During Therapy: WFL for tasks assessed/performed Overall Cognitive Status: No family/caregiver present to determine baseline cognitive functioning                                 General Comments: Conversation normal      Exercises Other Exercises Other Exercises: x1 seated gaze stability exercises given and reviewed.  Pt verbalized understanding, but if he is here tomorrow we could use to review them as well.          Pertinent Vitals/Pain Pain Assessment: No/denies pain           PT Goals (current goals can now be found in the care plan section) Acute Rehab PT Goals Patient Stated Goal: hopes to get home soon Progress towards PT goals: Progressing toward goals    Frequency    Min 3X/week      PT Plan Current plan remains appropriate       AM-PAC PT "6 Clicks" Mobility   Outcome Measure  Help needed turning from your back to your side while in a flat bed without using bedrails?: None Help needed moving  from lying on your back to sitting on the side of a flat bed without using bedrails?: None Help needed moving to and from a bed to a chair (including a wheelchair)?: A Little Help needed standing up from a chair using your arms (e.g., wheelchair or bedside chair)?: A Little Help needed to walk in hospital room?: A Little Help needed climbing 3-5 steps with a railing? : A Little 6 Click Score: 20    End of Session Equipment Utilized During Treatment: Gait belt Activity Tolerance: Patient tolerated treatment well Patient left: in bed;with call bell/phone within reach;with bed alarm set   PT Visit Diagnosis: Unsteadiness on feet (R26.81);Other abnormalities of gait and mobility (R26.89);Muscle weakness (generalized) (M62.81);Difficulty in walking, not elsewhere classified (R26.2)     Time: 1753-1830 PT Time Calculation (min) (ACUTE ONLY): 37 min  Charges:  $Gait Training: 8-22 mins $Therapeutic Activity: 8-22 mins          Elyas Villamor B. Slyvia Lartigue, PT, DPT  Acute Rehabilitation [phone] pager #[phone] office            05/17/2018, 6:42 PM

## 2018-05-17 NOTE — Progress Notes (Signed)
CSW received consult regarding PT recommendation of SNF at discharge.  Patient is refusing SNF. CSW has discussed the patient's current level of function related to mobility with the patient. He acknowledge understanding of this and feels he would be able to have his care needs met at home. He reports that his daughter is coming to town tomorrow. He reports interest in home health services. RNCM aware.   CSW signing off as no further needs present.   Mark Locus Azalya Galyon LCSW [phone]

## 2018-05-17 NOTE — Progress Notes (Signed)
[facility]                                                                                                                                                                         Patient Demographics  Mark Fernandez, is a 67 y.o. male  ZOX:[id number]  WJX:[id number]  DOB - 1951-06-11  Admit date - 05/15/2018  Admitting Physician Courage Mariea Clonts, MD  Outpatient Primary MD for the patient is Patient, No Pcp Per  LOS - 0   Chief Complaint  Patient presents with  . Dizziness        Assessment & Plan    Patient seen briefly today due for discharge soon per Discharge done yesterday by me, no further issues, Vital signs stable, patient feels fine.  Was discharged yesterday home however he tells me today that since his daughter was not in town and he was alone he was scared to go by himself, he worked with PT and was found to be weak yesterday and SNF was recommended.  This morning he is sitting in chair I walked him with a walker for close to 30 feet, he stood up from chair by himself and was feeling great.  He requests that can he be kept for another day until his daughter comes tomorrow as he feels scared living by himself.  At this point no changes will be made he will be discharged on his previously discharge medications.  We will monitor him for 24 hours and discharged with home health PT tomorrow.  CBG (last 3)  Recent Labs    05/17/18 0012 05/17/18 0405 05/17/18 0753  GLUCAP 77 75 89       Medications  Scheduled Meds: . aspirin EC  81 mg Oral Daily  . atorvastatin  80 mg Oral q1800  . carvedilol  6.25 mg Oral BID WC  . heparin  5,000 Units Subcutaneous Q8H  . insulin aspart  0-24 Units Subcutaneous Q4H  . insulin aspart protamine- aspart  35 Units Subcutaneous BID WC  . isosorbide mononitrate  30 mg Oral Daily  . pantoprazole  40 mg Oral Daily   Continuous Infusions: . lactated ringers  250 mL/hr at 05/17/18 1030   PRN Meds:.acetaminophen **OR** [DISCONTINUED] acetaminophen, albuterol, nitroGLYCERIN, [DISCONTINUED] ondansetron **OR** ondansetron (ZOFRAN) IV, polyethylene glycol, traZODone    Time Spent in minutes   10 minutes   Susa Raring M.D on 05/17/2018 at 11:58 AM  Between 7am to 7pm - Pager - [phone]  After 7pm go to [email or web address] - password [email or web address]  And look for the night coverage person covering for me after hours  [facility] Office  [phone]    Subjective:   Mark Fernandez today has, No headache, No chest pain,  No abdominal pain - No Nausea, No new weakness tingling or numbness, No Cough - SOB.    Objective:   Vitals:   05/16/18 1526 05/16/18 2019 05/17/18 0014 05/17/18 0407  BP: (!) 147/72 128/60 (!) 143/62 (!) 157/71  Pulse: 61 67 70 71  Resp: 16 18 18 18   Temp: (!) 97.4 F (36.3 C) 98 F (36.7 C) (!) 100.9 F (38.3 C) 99.1 F (37.3 C)  TempSrc: Oral Oral Oral Oral  SpO2: 100% 100% 97% 100%  Weight:      Height:        Wt Readings from Last 3 Encounters:  05/15/18 87.5 kg  04/15/18 91.4 kg  06/22/17 101.2 kg     Intake/Output Summary (Last 24 hours) at 05/17/2018 1158 Last data filed at 05/16/2018 1819 Gross per 24 hour  Intake 1059 ml  Output -  Net 1059 ml    Exam Awake Alert, Oriented X 3, No new F.N deficits, Normal affect [place].AT,PERRAL Supple Neck,No JVD, No cervical lymphadenopathy appriciated.  Symmetrical Chest wall movement, Good air movement bilaterally, CTAB RRR,No Gallops,Rubs or new Murmurs, No Parasternal Heave +ve B.Sounds, Abd Soft, Non tender, No organomegaly appriciated, No rebound - guarding or rigidity. No Cyanosis, Clubbing or edema, No new Rash or bruise     Data Review

## 2018-05-18 ENCOUNTER — Observation Stay (HOSPITAL_COMMUNITY): Payer: Medicare Other

## 2018-05-18 LAB — URINALYSIS, ROUTINE W REFLEX MICROSCOPIC
BILIRUBIN URINE: NEGATIVE
GLUCOSE, UA: NEGATIVE mg/dL
HGB URINE DIPSTICK: NEGATIVE
Ketones, ur: NEGATIVE mg/dL
Leukocytes, UA: NEGATIVE
Nitrite: NEGATIVE
PROTEIN: NEGATIVE mg/dL
Specific Gravity, Urine: 1.006 (ref 1.005–1.030)
pH: 6 (ref 5.0–8.0)

## 2018-05-18 LAB — CBC
HEMATOCRIT: 31.1 % — AB (ref 39.0–52.0)
HEMOGLOBIN: 10 g/dL — AB (ref 13.0–17.0)
MCH: 27 pg (ref 26.0–34.0)
MCHC: 32.2 g/dL (ref 30.0–36.0)
MCV: 83.8 fL (ref 80.0–100.0)
Platelets: 359 10*3/uL (ref 150–400)
RBC: 3.71 MIL/uL — ABNORMAL LOW (ref 4.22–5.81)
RDW: 12.9 % (ref 11.5–15.5)
WBC: 5.1 10*3/uL (ref 4.0–10.5)
nRBC: 0 % (ref 0.0–0.2)

## 2018-05-18 LAB — BASIC METABOLIC PANEL
Anion gap: 10 (ref 5–15)
BUN: 6 mg/dL — AB (ref 8–23)
CALCIUM: 8.7 mg/dL — AB (ref 8.9–10.3)
CHLORIDE: 105 mmol/L (ref 98–111)
CO2: 24 mmol/L (ref 22–32)
CREATININE: 1.22 mg/dL (ref 0.61–1.24)
GFR calc non Af Amer: >60 mL/min (ref >60)
Glucose, Bld: 110 mg/dL — ABNORMAL HIGH (ref 70–99)
Potassium: 3.3 mmol/L — ABNORMAL LOW (ref 3.5–5.1)
SODIUM: 139 mmol/L (ref 135–145)

## 2018-05-18 LAB — GLUCOSE, CAPILLARY
GLUCOSE-CAPILLARY: 88 mg/dL (ref 70–99)
GLUCOSE-CAPILLARY: 94 mg/dL (ref 70–99)
Glucose-Capillary: 98 mg/dL (ref 70–99)

## 2018-05-18 MED ORDER — POTASSIUM CHLORIDE CRYS ER 20 MEQ PO TBCR
40.0000 meq | EXTENDED_RELEASE_TABLET | Freq: Once | ORAL | Status: AC
  Administered 2018-05-18: 40 meq via ORAL
  Filled 2018-05-18: qty 2

## 2018-05-18 MED ORDER — TAMSULOSIN HCL 0.4 MG PO CAPS: 0.4000 mg | ORAL_CAPSULE | Freq: Every day | ORAL | 0 refills | Status: AC

## 2018-05-18 MED ORDER — TAMSULOSIN HCL 0.4 MG PO CAPS
0.4000 mg | ORAL_CAPSULE | ORAL | Status: AC
  Administered 2018-05-18: 0.4 mg via ORAL
  Filled 2018-05-18: qty 1

## 2018-05-18 NOTE — Progress Notes (Signed)
Lucius ConnJames C Langstaff to be D/C'd to home with home health per MD order.  Discussed with the patient and all questions fully answered.  VSS, Skin clean, dry and intact without evidence of skin break down, no evidence of skin tears noted. IV catheter discontinued intact. Site without signs and symptoms of complications. Dressing and pressure applied.  An After Visit Summary was printed and given to the patient. Patient received prescription and rolling walker.  D/c education completed with patient/family including follow up instructions, medication list, d/c activities limitations if indicated, with other d/c instructions as indicated by MD - patient able to verbalize understanding, all questions fully answered.   Patient instructed to return to ED, call 911, or call MD for any changes in condition.   Patient escorted via WC, and D/C home via private auto.  Joellyn HaffKayla L Price 05/18/2018 1:57 PM

## 2018-05-18 NOTE — Progress Notes (Signed)
Hypoglycemic Event  CBG:  69 @ 2135  Treatment:  Orange juice and graham crackers Symptoms:  None  Follow-up CBG: Time:  2259 CBG Result:  82  Possible Reasons for Event:  Inadequate PO intake  Comments/MD notified:  n/a    Laverta BaltimoreMichael D Andie Mungin

## 2018-05-19 LAB — URINE CULTURE: CULTURE: NO GROWTH

## 2018-05-23 ENCOUNTER — Inpatient Hospital Stay: Payer: Medicare Other | Admitting: Family Medicine

## 2018-05-24 ENCOUNTER — Telehealth: Payer: Self-pay

## 2018-05-24 NOTE — Telephone Encounter (Signed)
[facility]. [facility] noting that the patient missed his appointment with Dr Jillyn HiddenFulp on 05/23/18 and she is not able to sign the orders as she has not seen him.

## 2018-05-25 ENCOUNTER — Telehealth: Payer: Self-pay | Admitting: General Practice

## 2018-05-25 ENCOUNTER — Emergency Department (HOSPITAL_COMMUNITY): Payer: Medicare Other

## 2018-05-25 ENCOUNTER — Emergency Department (HOSPITAL_COMMUNITY)
Admission: EM | Admit: 2018-05-25 | Discharge: 2018-05-26 | Disposition: A | Discharge status: Home / Self Care | Payer: Medicare Other | Attending: Emergency Medicine | Admitting: Emergency Medicine

## 2018-05-25 ENCOUNTER — Encounter (HOSPITAL_COMMUNITY): Payer: Self-pay

## 2018-05-25 ENCOUNTER — Other Ambulatory Visit: Payer: Self-pay

## 2018-05-25 DIAGNOSIS — R296 Repeated falls: Secondary | ICD-10-CM | POA: Diagnosis not present

## 2018-05-25 DIAGNOSIS — E119 Type 2 diabetes mellitus without complications: Secondary | ICD-10-CM | POA: Diagnosis not present

## 2018-05-25 DIAGNOSIS — R531 Weakness: Secondary | ICD-10-CM | POA: Diagnosis present

## 2018-05-25 DIAGNOSIS — Z794 Long term (current) use of insulin: Secondary | ICD-10-CM | POA: Diagnosis not present

## 2018-05-25 DIAGNOSIS — I2510 Atherosclerotic heart disease of native coronary artery without angina pectoris: Secondary | ICD-10-CM | POA: Insufficient documentation

## 2018-05-25 DIAGNOSIS — I10 Essential (primary) hypertension: Secondary | ICD-10-CM | POA: Diagnosis not present

## 2018-05-25 DIAGNOSIS — Z87891 Personal history of nicotine dependence: Secondary | ICD-10-CM | POA: Insufficient documentation

## 2018-05-25 DIAGNOSIS — Z951 Presence of aortocoronary bypass graft: Secondary | ICD-10-CM | POA: Diagnosis not present

## 2018-05-25 DIAGNOSIS — Z79899 Other long term (current) drug therapy: Secondary | ICD-10-CM | POA: Diagnosis not present

## 2018-05-25 DIAGNOSIS — E860 Dehydration: Secondary | ICD-10-CM

## 2018-05-25 LAB — CBC WITH DIFFERENTIAL/PLATELET
Abs Immature Granulocytes: 0.06 10*3/uL (ref 0.00–0.07)
Basophils Absolute: 0 10*3/uL (ref 0.0–0.1)
Basophils Relative: 0 %
EOS ABS: 0.1 10*3/uL (ref 0.0–0.5)
EOS PCT: 1 %
HEMATOCRIT: 36 % — AB (ref 39.0–52.0)
HEMOGLOBIN: 11.3 g/dL — AB (ref 13.0–17.0)
Immature Granulocytes: 1 %
Lymphocytes Relative: 23 %
Lymphs Abs: 1.4 10*3/uL (ref 0.7–4.0)
MCH: 26.7 pg (ref 26.0–34.0)
MCHC: 31.4 g/dL (ref 30.0–36.0)
MCV: 84.9 fL (ref 80.0–100.0)
MONO ABS: 0.9 10*3/uL (ref 0.1–1.0)
MONOS PCT: 15 %
NEUTROS PCT: 60 %
Neutro Abs: 3.7 10*3/uL (ref 1.7–7.7)
Platelets: 457 10*3/uL — ABNORMAL HIGH (ref 150–400)
RBC: 4.24 MIL/uL (ref 4.22–5.81)
RDW: 12.6 % (ref 11.5–15.5)
WBC: 6.2 10*3/uL (ref 4.0–10.5)
nRBC: 0 % (ref 0.0–0.2)

## 2018-05-25 LAB — I-STAT VENOUS BLOOD GAS, ED
ACID-BASE EXCESS: 1 mmol/L (ref 0.0–2.0)
Bicarbonate: 24.3 mmol/L (ref 20.0–28.0)
O2 SAT: 70 %
PH VEN: 7.488 — AB (ref 7.250–7.430)
TCO2: 25 mmol/L (ref 22–32)
pCO2, Ven: 32.1 mmHg — ABNORMAL LOW (ref 44.0–60.0)
pO2, Ven: 33 mmHg (ref 32.0–45.0)

## 2018-05-25 LAB — COMPREHENSIVE METABOLIC PANEL
ALK PHOS: 54 U/L (ref 38–126)
ALT: 18 U/L (ref 0–44)
ANION GAP: 14 (ref 5–15)
AST: 33 U/L (ref 15–41)
Albumin: 2.6 g/dL — ABNORMAL LOW (ref 3.5–5.0)
BILIRUBIN TOTAL: 1.1 mg/dL (ref 0.3–1.2)
BUN: 14 mg/dL (ref 8–23)
CALCIUM: 8.9 mg/dL (ref 8.9–10.3)
CO2: 24 mmol/L (ref 22–32)
Chloride: 97 mmol/L — ABNORMAL LOW (ref 98–111)
Creatinine, Ser: 1.61 mg/dL — ABNORMAL HIGH (ref 0.61–1.24)
GFR calc Af Amer: 50 mL/min — ABNORMAL LOW (ref >60)
GFR, EST NON AFRICAN AMERICAN: 43 mL/min — AB (ref >60)
Glucose, Bld: 258 mg/dL — ABNORMAL HIGH (ref 70–99)
Potassium: 3.7 mmol/L (ref 3.5–5.1)
Sodium: 135 mmol/L (ref 135–145)
TOTAL PROTEIN: 7.9 g/dL (ref 6.5–8.1)

## 2018-05-25 LAB — CBG MONITORING, ED: Glucose-Capillary: 235 mg/dL — ABNORMAL HIGH (ref 70–99)

## 2018-05-25 MED ORDER — MECLIZINE HCL 12.5 MG PO TABS: 12.5000 mg | ORAL_TABLET | Freq: Two times a day (BID) | ORAL | 0 refills | Status: AC | PRN

## 2018-05-25 MED ORDER — SODIUM CHLORIDE 0.9 % IV BOLUS
1000.0000 mL | Freq: Once | INTRAVENOUS | Status: AC
  Administered 2018-05-25: 1000 mL via INTRAVENOUS

## 2018-05-25 NOTE — ED Notes (Signed)
Patient transported to CT 

## 2018-05-25 NOTE — Telephone Encounter (Signed)
[facility].  HIPAA compliant message message left noting that the patient has not been seen by the provider, so the provider is not able to provide orders. CM call back # [phone] left with the message

## 2018-05-25 NOTE — Telephone Encounter (Signed)
[facility] Fernandez with [facility] called in regards to PT orders. He was informed of the note that was put in and would still like to know if the orders can be put in to be processed. Please follow up.

## 2018-05-25 NOTE — ED Notes (Signed)
To x-ray

## 2018-05-25 NOTE — ED Triage Notes (Signed)
Pt from home with ems for generalized fatigue and " not feeling well" Neighbor called ems due to pt family not being able to get a hold of pt, ems found pt at home with all the lights off, answering the door naked. Pt is alert and oriented x4, answers all questions appropriately but has a hard time focusing. VSS, nad noted

## 2018-05-25 NOTE — ED Notes (Signed)
Pt transported to CT ?

## 2018-05-25 NOTE — Discharge Instructions (Addendum)
Thank you for allowing us taking care of [facility].  You were seen due to generalized weakness, and dizziness.  Your head CT scan was reassuring.  Your blood work consistent with dehydration.  We gave you IV fluids.  Please make sure to keep yourself hydrated at home. You can take meclizine as instructed and as needed for your dizziness and we recommend you to follow-up with neurologist. If not improved or developing new weakness, numbness, loss of consciousness or other new symptoms return to emergency room.  Take you

## 2018-05-25 NOTE — ED Provider Notes (Signed)
[facility] EMERGENCY DEPARTMENT Provider Note   CSN: [id number] Arrival date & time: 05/25/18  2008     History   Chief Complaint Chief Complaint  Patient presents with  . Weakness    HPI Mark Fernandez is a 67 y.o. male.  With past medical history of CAD status post CABG, HTN, hyper cholesterolemia, DM type II, cardiomyopathy, Bell's palsy, arrhythmia presented to ED due to generalized weakness.  Reports low strengths muscles. and mechanical fall without hitting his head yesterday and today.  Also reports minor headache.  He has had decreased p.o. intake due to decreased appetite since last week.  Denies fever, chills, rash chest pain.  Has some baseline shortness of breath with activity.  No lower extremity edema.  No cough.  No urinary symptoms.  Reports some dark stool since being discharged from hospital 10 days ago.(He hospitalized for DKA and orthostatic hypotension and discharged 10 days ago.)  He lives alone.  Family and 1 of his friend are in the room and endorse that he did not respond to their phone calls today, and he did not open the door, unless EMS arrived.  However, he says he did not respond to them because he was in bathroom.   HPI  Past Medical History:  Diagnosis Date  . Arrhythmia    NSVT  . Bell's palsy   . Cardiomyopathy    EF 45%  . Coronary artery disease    2007 status post CABG X 4  (left internal mammary artery to left anterior descending, saphenous vein graft to diagonal, saphenous  vein graft to obtuse marginal, saphenous vein graft to right coronary   artery).  . Diabetes mellitus    type II  . History of tobacco use   . Hypercholesteremia   . Hypertension   . SVT (supraventricular tachycardia) [facility])     Patient Active Problem List   Diagnosis Date Noted  . DKA (diabetic ketoacidosis) (HCC) 05/15/2018  . Orthostatic dizziness 05/15/2018  . Chest pain 12/09/2015  . Abdominal pain 12/09/2015  . Diabetes (HCC) 12/09/2015    . NSTEMI (non-ST elevated myocardial infarction) (HCC) 12/09/2015  . Overweight(278.02) 02/10/2011  . Hypertension   . Coronary artery disease   . Cardiomyopathy (HCC)   . Hypercholesteremia   . Arrhythmia     Past Surgical History:  Procedure Laterality Date  . CARDIAC CATHETERIZATION N/A 12/10/2015   Procedure: Left Heart Cath and Cors/Grafts Angiography;  Surgeon: Lennette Bihari, MD;  Location: [facility] INVASIVE CV LAB;  Service: Cardiovascular;  Laterality: N/A;  . CATARACT EXTRACTION, BILATERAL    . CHOLECYSTECTOMY    . CORONARY ARTERY BYPASS GRAFT    . LITHOTRIPSY          Home Medications    Prior to Admission medications   Medication Sig Start Date End Date Taking? Authorizing Provider  aspirin EC 81 MG EC tablet Take 1 tablet (81 mg total) by mouth daily. 12/11/15   Arty Baumgartner, NP  atorvastatin (LIPITOR) 80 MG tablet Take 1 tablet (80 mg total) by mouth daily at 6 PM. 04/15/18   Barrett, Joline Salt, PA-C  carvedilol (COREG) 12.5 MG tablet Take 1 tablet (12.5 mg total) by mouth 2 (two) times daily with a meal. 04/15/18   Barrett, Joline Salt, PA-C  HUMULIN 70/30 (70-30) 100 UNIT/ML injection Inject 45 Units into the skin 2 (two) times daily.  11/05/15   [provider]  isosorbide mononitrate (IMDUR) 30 MG 24 hr tablet  Take 1 tablet (30 mg total) by mouth daily. 04/15/18   Barrett, Joline Salt, PA-C  meclizine (ANTIVERT) 12.5 MG tablet Take 1 tablet (12.5 mg total) by mouth 2 (two) times daily as needed for dizziness. 05/25/18   Braison Snoke, Shawna Orleans, MD  nitroGLYCERIN (NITROSTAT) 0.4 MG SL tablet Place 1 tablet (0.4 mg total) under the tongue every 5 (five) minutes x 3 doses as needed for chest pain. 04/15/18   Barrett, Joline Salt, PA-C  pantoprazole (PROTONIX) 40 MG tablet Take 1 tablet (40 mg total) by mouth daily. 04/15/18   Barrett, Joline Salt, PA-C  tamsulosin (FLOMAX) 0.4 MG CAPS capsule Take 1 capsule (0.4 mg total) by mouth daily after supper. 05/18/18   Leroy Sea, MD    Family History Family History  Problem Relation Age of Onset  . Hypertension Mother   . Diabetes Mother     Social History Social History   Tobacco Use  . Smoking status: Former Smoker    Last attempt to quit: 02/10/2003    Years since quitting: 15.2  . Smokeless tobacco: Never Used  Substance Use Topics  . Alcohol use: Yes  . Drug use: No     Allergies   Patient has no known allergies.   Review of Systems Review of Systems   Physical Exam Updated Vital Signs BP (!) 175/81   Pulse 90   Temp 99.5 F (37.5 C) (Oral)   Resp (!) 21   Ht 5\' 4"  (1.626 m)   Wt 87.5 kg   SpO2 98%   BMI 33.11 kg/m   Physical Exam  Constitutional: He is oriented to person, place, and time. He appears well-developed. No distress.  HENT:  Head: Normocephalic and atraumatic.  Eyes: EOM are normal.  Cardiovascular: Normal rate, regular rhythm and normal heart sounds.  No murmur heard. Pulmonary/Chest: Effort normal. He has no wheezes. He has faint bibasilar fine crackle Abdominal: Soft. Bowel sounds are normal. There is no tenderness. There is no guarding.  Musculoskeletal: He exhibits no edema or tenderness.  Neurological: He is alert and oriented to person, place, and time. He exhibits abnormal muscle tone 4/5 on distal left lower extremity. (chronic?) Psychiatric: He has a normal mood and affect.    ED Treatments / Results  Labs (all labs ordered are listed, but only abnormal results are displayed) Labs Reviewed  CBC WITH DIFFERENTIAL/PLATELET - Abnormal; Notable for the following components:      Result Value   Hemoglobin 11.3 (*)    HCT 36.0 (*)    Platelets 457 (*)    All other components within normal limits  COMPREHENSIVE METABOLIC PANEL - Abnormal; Notable for the following components:   Chloride 97 (*)    Glucose, Bld 258 (*)    Creatinine, Ser 1.61 (*)    Albumin 2.6 (*)    GFR calc non Af Amer 43 (*)    GFR calc Af Amer 50 (*)    All other  components within normal limits  CBG MONITORING, ED - Abnormal; Notable for the following components:   Glucose-Capillary 235 (*)    All other components within normal limits  I-STAT VENOUS BLOOD GAS, ED - Abnormal; Notable for the following components:   pH, Ven 7.488 (*)    pCO2, Ven 32.1 (*)    All other components within normal limits  BLOOD GAS, VENOUS  URINALYSIS, ROUTINE W REFLEX MICROSCOPIC    EKG EKG Interpretation  Date/Time:  Wednesday May 25 2018 20:22:46 EST Ventricular Rate:  92 PR Interval:    QRS Duration: 108 QT Interval:  377 QTC Calculation: 467 R Axis:   -21 Text Interpretation:  Sinus rhythm Ventricular trigeminy Borderline left axis deviation Minimal ST depression, lateral leads Minimal ST elevation, anterior leads No significant change since last tracing Confirmed by Richardean CanalYao, David H [phone]([id number]) on 05/25/2018 8:51:47 PM   Radiology Dg Chest 2 View  Result Date: 05/25/2018 CLINICAL DATA:  67 year old male with dizziness. EXAM: CHEST - 2 VIEW COMPARISON:  Chest radiograph dated 05/18/2018 FINDINGS: The lungs are clear. There is no pleural effusion or pneumothorax. The cardiac silhouette is within normal limits. Median sternotomy wires and CABG vascular clips. No acute osseous pathology. IMPRESSION: No active cardiopulmonary disease. Electronically Signed   By: Elgie CollardArash  Radparvar M.D.   On: 05/25/2018 23:06   Ct Head Wo Contrast  Result Date: 05/25/2018 CLINICAL DATA:  67 year old male with weakness and fall. EXAM: CT HEAD WITHOUT CONTRAST TECHNIQUE: Contiguous axial images were obtained from the base of the skull through the vertex without intravenous contrast. COMPARISON:  None. FINDINGS: Brain: The ventricles and sulci appropriate size for patient's age. Incidental note of a cavum septum close arm and cavum vergae. There is mild periventricular and deep white matter chronic microvascular ischemic changes. There is no acute intracranial hemorrhage. No mass effect or  midline shift. No extra-axial fluid collection. Vascular: No hyperdense vessel or unexpected calcification. Skull: Normal. Negative for fracture or focal lesion. Sinuses/Orbits: No acute finding. Other: None IMPRESSION: 1. No acute intracranial pathology. 2. Mild chronic microvascular ischemic changes. Electronically Signed   By: Elgie CollardArash  Radparvar M.D.   On: 05/25/2018 21:57    Procedures Procedures (including critical care time)  Medications Ordered in ED Medications  sodium chloride 0.9 % bolus 1,000 mL (has no administration in time range)     Initial Impression / Assessment and Plan / ED Course  I have reviewed the triage vital signs and the nursing notes.  Pertinent labs & imaging results that were available during my care of the patient were reviewed by me and considered in my medical decision making (see chart for details).    Mark Fernandez is a 67 y.o. male.  With past medical history of CAD status post CABG, HTN, hyper cholesterolemia, DM type II, cardiomyopathy, Bell's palsy, arrhythmia presented to ED due to generalized weakness. Possible melena. Is alert and oriented No new neurological deficit on exam Hb 11 and stable at baseline AKI Nl electrolytes   CXR no acute cardiopulmonary abnormality Head Ct scan. Results discussed with patient and family.  Patient is asymptomatic, stable, alert and oriented, with normal exam.  His symptom was likely 2/2 decreased PO intake and dehydration. Gave IV fluid.  Planned to discharge with low dose meclizine as needed and encouraged to keep hydrated. ED return precautions provided.  Final Clinical Impressions(s) / ED Diagnoses   Final diagnoses:  Generalized weakness  Dehydration    ED Discharge Orders         Ordered    meclizine (ANTIVERT) 12.5 MG tablet  2 times daily PRN     05/25/18 2334           Chevis PrettyMasoudi, Davonda Ausley, MD 05/28/18 1315    Charlynne PanderYao, David Hsienta, MD 05/30/18 2010

## 2018-05-26 NOTE — Progress Notes (Deleted)
Cardiology Office Note   Date:  05/26/2018   ID:  Mark Fernandez, DOB [birth year], MRN [id number]  PCP:  Jarome Matin, MD Cardiologist:  Rollene Rotunda, MD 03/25/2015 Mark Demark, PA-C 04/15/2018  No chief complaint on file.   History of Present Illness: Mark Fernandez is a 67 y.o. male with a history of CABG 2007 (LIMA-LAD, SVG-D1, SVG-OM, SVG-RCA), EF 45%, NSVT, SVT, DM, HTN, HLD, remote tob use, NSTEMI 11/2015 w/ SVG-D1 100%>>med rx, EF 50-55% by echo, leg pain w/ statins, ICM w/ EF 45%>>55% echo 2017, Bell's palsy  10/25 office visit, meds restarted, lipids checked Admitted 11/24-11/27/2019 with weakness, mild DKA 2nd non-compliance with insulin, A1c 12.5, dehydration and orthostatic hypotension noted, mild ARF with creatinine 1.47 on admission, 1.22 at discharge 12/4 ER visit for weakness, dehydration, creatinine 1.61.  Hydrated and given meclizine as needed  Mark Fernandez presents for ***   Past Medical History:  Diagnosis Date  . Arrhythmia    NSVT  . Bell's palsy   . Cardiomyopathy    EF 45%  . Coronary artery disease    2007 status post CABG X 4  (left internal mammary artery to left anterior descending, saphenous vein graft to diagonal, saphenous  vein graft to obtuse marginal, saphenous vein graft to right coronary   artery).  . Diabetes mellitus    type II  . History of tobacco use   . Hypercholesteremia   . Hypertension   . SVT (supraventricular tachycardia) (HCC)     Past Surgical History:  Procedure Laterality Date  . CARDIAC CATHETERIZATION N/A 12/10/2015   Procedure: Left Heart Cath and Cors/Grafts Angiography;  Surgeon: Lennette Bihari, MD;  Location: [facility] INVASIVE CV LAB;  Service: Cardiovascular;  Laterality: N/A;  . CATARACT EXTRACTION, BILATERAL    . CHOLECYSTECTOMY    . CORONARY ARTERY BYPASS GRAFT    . LITHOTRIPSY      Current Outpatient Medications  Medication Sig Dispense Refill  . aspirin EC 81 MG EC tablet Take 1 tablet (81 mg  total) by mouth daily.    Marland Kitchen atorvastatin (LIPITOR) 80 MG tablet Take 1 tablet (80 mg total) by mouth daily at 6 PM. 30 tablet 11  . carvedilol (COREG) 12.5 MG tablet Take 1 tablet (12.5 mg total) by mouth 2 (two) times daily with a meal. 60 tablet 2  . HUMULIN 70/30 (70-30) 100 UNIT/ML injection Inject 45 Units into the skin 2 (two) times daily.   0  . isosorbide mononitrate (IMDUR) 30 MG 24 hr tablet Take 1 tablet (30 mg total) by mouth daily. 30 tablet 11  . meclizine (ANTIVERT) 12.5 MG tablet Take 1 tablet (12.5 mg total) by mouth 2 (two) times daily as needed for dizziness. 30 tablet 0  . nitroGLYCERIN (NITROSTAT) 0.4 MG SL tablet Place 1 tablet (0.4 mg total) under the tongue every 5 (five) minutes x 3 doses as needed for chest pain. 25 tablet 2  . pantoprazole (PROTONIX) 40 MG tablet Take 1 tablet (40 mg total) by mouth daily. 30 tablet 11  . tamsulosin (FLOMAX) 0.4 MG CAPS capsule Take 1 capsule (0.4 mg total) by mouth daily after supper. 30 capsule 0   No current facility-administered medications for this visit.     Allergies:   Patient has no known allergies.    Social History:  The patient  reports that he quit smoking about 15 years ago. He has never used smokeless tobacco. He reports that he drinks alcohol. He reports  that he does not use drugs.   Family History:  The patient's family history includes Diabetes in his mother; Hypertension in his mother.  He indicated that the status of his mother is unknown. He indicated that his father is deceased.    ROS:  Please see the history of present illness. All other systems are reviewed and negative.    PHYSICAL EXAM: VS:  There were no vitals taken for this visit. , BMI There is no height or weight on file to calculate BMI. GEN: Well nourished, well developed, male in no acute distress HEENT: normal for age  Neck: no JVD, no carotid bruit, no masses Cardiac: RRR; no murmur, no rubs, or gallops Respiratory:  clear to auscultation  bilaterally, normal work of breathing GI: soft, nontender, nondistended, + BS MS: no deformity or atrophy; no edema; distal pulses are 2+ in all 4 extremities  Skin: warm and dry, no rash Neuro:  Strength and sensation are intact Psych: euthymic mood, full affect   EKG:  EKG {ACTION; IS/IS [id number]}OT:[id number]} ordered today. The ekg ordered today demonstrates ***  ECHO: 12/10/2015 - Left ventricle: The cavity size was normal. There was mild   concentric hypertrophy. Systolic function was normal. The   estimated ejection fraction was in the range of 50% to 55%. Wall   motion was normal; there were no regional wall motion   abnormalities. Doppler parameters are consistent with abnormal   left ventricular relaxation (grade 1 diastolic dysfunction).   Doppler parameters are consistent with elevated ventricular   end-diastolic filling pressure. - Aortic valve: Transvalvular velocity was within the normal range.   There was no stenosis. There was no regurgitation. - Mitral valve: Calcified annulus. Transvalvular velocity was   within the normal range. There was no evidence for stenosis.   There was trivial regurgitation. - Left atrium: The atrium was moderately dilated. - Right ventricle: The cavity size was normal. Wall thickness was   normal. Systolic function was normal. - Atrial septum: No defect or patent foramen ovale was identified   by color flow Doppler. - Tricuspid valve: There was trivial regurgitation. - Pulmonary arteries: Systolic pressure was within the normal   range.  CATH: 11/2015  Ost LAD to Prox LAD lesion, 70% stenosed.  Mid RCA lesion, 100% stenosed.  2nd Mrg lesion, 99% stenosed.  Lat 1st Mrg lesion, 99% stenosed.  1st Mrg lesion, 80% stenosed.  LIMA .  Dist LAD lesion, 80% stenosed.  Mid Cx lesion, 80% stenosed.  Prox RCA lesion, 30% stenosed.  SVG .  Prox Graft lesion, 40% stenosed.  Ost RPDA to RPDA lesion, 95% stenosed.  SVG .  Origin  lesion, 100% stenosed.  SVG .  The LIMA graft was widely patent and anastomosed into the mid LAD. The LAD beyond the anastomosis was small caliber. There was diffuse narrowing of 80% in the mid distal LAD segment.  The SVG to the distal RCA was a large graft. There was 40% smooth eccentric proximal stenosis focally. The graft anastomosing to the distal RCA. The first PDA branch was diffusely narrowed 99%. The distal RCA and is in a small posterolateral branch.  The SVG to the diagonal vessel was totally occluded at its origin.  The SVG supplying the marginal vessel was widely patent. There was narrowing in a branch of the marginal after the anastomosis of about 70%.   Severe multi-vessel native LAD disease with diffuse 70% proximal LAD stenosis; 90 and 99% stenoses in the OM1 and OM 2  branches of the circumflex coronary artery with 80% mid AV groove circumflex stenosis after the takeoff of the second marginal branch; 30% proximal in total occlusion of the mid RCA after an RV marginal branch.  Patent LIMA graft supplying the mid LAD but with diffusely diseased small caliber LAD with narrowing of 80% in the mid distal segment.  Patent SVG to circumflex marginal branch.  Occluded SVG which previously supplied the diagonal vessel.  Pain SVG to the distal RCA, but with 40% smooth eccentric focal proximal stenosis in the proximal segment of the graft and severe diffuse subtotal PDA stenosis in the native RCA beyond the anastomosis.  LVEDP 20 mmHg.  RECOMMENDATION: Increased medical therapy. The patient is already on amlodipine as well as carvedilol. With diffuse native CAD consider the addition of Ranexa and oral nitrates. A 2-D echo Doppler study will be ordered to assess LV function. The patient will be hydrated post procedure with his underlying renal insufficiency. Diagnostic Diagram        MONITOR: ***   Recent Labs: 05/15/2018: B Natriuretic Peptide  105.4 05/17/2018: Magnesium 2.0 05/25/2018: ALT 18; BUN 14; Creatinine, Ser 1.61; Hemoglobin 11.3; Platelets 457; Potassium 3.7; Sodium 135  CBC    Component Value Date/Time   WBC 6.2 05/25/2018 2053   RBC 4.24 05/25/2018 2053   HGB 11.3 (L) 05/25/2018 2053   HGB 12.1 (L) 04/15/2018 0925   HCT 36.0 (L) 05/25/2018 2053   HCT 36.4 (L) 04/15/2018 0925   PLT 457 (H) 05/25/2018 2053   PLT 329 04/15/2018 0925   MCV 84.9 05/25/2018 2053   MCV 83 04/15/2018 0925   MCH 26.7 05/25/2018 2053   MCHC 31.4 05/25/2018 2053   RDW 12.6 05/25/2018 2053   RDW 13.5 04/15/2018 0925   LYMPHSABS 1.4 05/25/2018 2053   MONOABS 0.9 05/25/2018 2053   EOSABS 0.1 05/25/2018 2053   BASOSABS 0.0 05/25/2018 2053   CMP Latest Ref Rng & Units 05/25/2018 05/18/2018 05/17/2018  Glucose 70 - 99 mg/dL 578(I) 696(E) 93  BUN 8 - 23 mg/dL 14 6(L) 9  Creatinine 9.52 - 1.24 mg/dL 8.41(L) 2.44 0.10  Sodium 135 - 145 mmol/L 135 139 137  Potassium 3.5 - 5.1 mmol/L 3.7 3.3(L) 3.3(L)  Chloride 98 - 111 mmol/L 97(L) 105 105  CO2 22 - 32 mmol/L 24 24 22   Calcium 8.9 - 10.3 mg/dL 8.9 2.7(O) 5.3(G)  Total Protein 6.5 - 8.1 g/dL 7.9 - -  Total Bilirubin 0.3 - 1.2 mg/dL 1.1 - -  Alkaline Phos 38 - 126 U/L 54 - -  AST 15 - 41 U/L 33 - -  ALT 0 - 44 U/L 18 - -     Lipid Panel Lab Results  Component Value Date   CHOL 209 (H) 04/15/2018   HDL 27 (L) 04/15/2018   LDLCALC 121 (H) 04/15/2018   TRIG 306 (H) 04/15/2018   CHOLHDL 7.7 (H) 04/15/2018      Wt Readings from Last 3 Encounters:  05/25/18 192 lb 14.4 oz (87.5 kg)  05/15/18 192 lb 14.4 oz (87.5 kg)  04/15/18 201 lb 9.6 oz (91.4 kg)     Other studies Reviewed: Additional studies/ records that were reviewed today include: ***.  ASSESSMENT AND PLAN:  1.  ***   Current medicines are reviewed at length with the patient today.  The patient {ACTIONS; HAS/DOES NOT HAVE:19233} concerns regarding medicines.  The following changes have been made:  {PLAN; NO  CHANGE:13088:s}  Labs/ tests ordered today include: *** No  orders of the defined types were placed in this encounter.    Disposition:   FU with Rollene Rotunda, MD  Signed, Mark Demark, PA-C  05/26/2018 4:38 PM    [facility] Phone: [phone]; Fax: [phone]

## 2018-05-27 ENCOUNTER — Telehealth: Payer: Self-pay | Admitting: *Deleted

## 2018-05-27 ENCOUNTER — Ambulatory Visit: Payer: Medicare Other | Admitting: Physician Assistant

## 2018-05-27 ENCOUNTER — Telehealth: Payer: Self-pay

## 2018-05-27 NOTE — Telephone Encounter (Signed)
LM2CB-->PT NO-SHOWED FOR RB APPT 12-6 @ 8AM SHE WANTS TO HAVE PT  SCHEDULED [facility]st [facility]

## 2018-05-27 NOTE — Telephone Encounter (Signed)
Information was sent through CoverMyMeds for PA for Meclizine 12.5 mg tablets.  Approved 05/27/2018 thru 06/22/2019.  [facility] was notified of at [phone].  Angelina OkGladys Nation Cradle, RN 05/27/2018 12:43 PM.

## 2018-05-30 ENCOUNTER — Encounter: Payer: Self-pay | Admitting: Physician Assistant

## 2018-05-30 NOTE — Telephone Encounter (Signed)
See message below. Thanks

## 2018-06-01 NOTE — Telephone Encounter (Signed)
Left VM for patient to call office to schedule his appt with Dr. Antoine PocheHochrein.

## 2018-06-02 ENCOUNTER — Telehealth: Payer: Self-pay | Admitting: Cardiology

## 2018-06-02 NOTE — Telephone Encounter (Signed)
I have called this patient on his home phone and cell phone to get in touch with him to schedule him with Dr. Antoine PocheHochrein.

## 2018-06-02 NOTE — Telephone Encounter (Signed)
Noted. Thanks.

## 2018-07-11 ENCOUNTER — Ambulatory Visit: Payer: Medicare Other | Admitting: Family

## 2018-07-11 DIAGNOSIS — Z0289 Encounter for other administrative examinations: Secondary | ICD-10-CM

## 2018-07-20 ENCOUNTER — Telehealth: Payer: Self-pay

## 2018-07-20 NOTE — Telephone Encounter (Signed)
Home health plan of care received for signature.  Call placed to [facility], spoke to Heath and informed her that Dr Jillyn Hidden has never seen this patient and is not able to sign the orders. She stated she would note that in the patient's record and she also noted that his case has been closed.

## 2018-07-27 ENCOUNTER — Other Ambulatory Visit (HOSPITAL_COMMUNITY): Payer: Self-pay | Admitting: Internal Medicine

## 2018-12-12 ENCOUNTER — Telehealth (HOSPITAL_COMMUNITY): Payer: Self-pay | Admitting: *Deleted

## 2018-12-12 NOTE — Telephone Encounter (Signed)
Received referral for pt to participate in cardiac rehab from Dr. Aline Brochure at [facility] s/p NSTEMI.  Unable to retrieve records via CareEverywhere to review.  Pt noted to have VA coverage.  Called and left message requesting call back to verify VA coverage/authorization for CR, interest in participating and when he will complete his follow up.  Call back number provided. Cherre Huger, BSN Cardiac and Training and development officer

## 2019-01-03 IMAGING — CT CT HEAD W/O CM
4 series · 16 of 47 positions shown, 18 images · non-contrast
Comparison: None.

CLINICAL DATA: 67-year-old male with weakness and fall.

EXAM:
CT HEAD WITHOUT CONTRAST
TECHNIQUE: Contiguous axial images were obtained from the base of the skull
through the vertex without intravenous contrast.

[Series 3: head wo · axial · 0.44mm/px · z∈[-132,-7]mm · 7 of 35 slices shown, 9 images]
[im 5/35  brain]
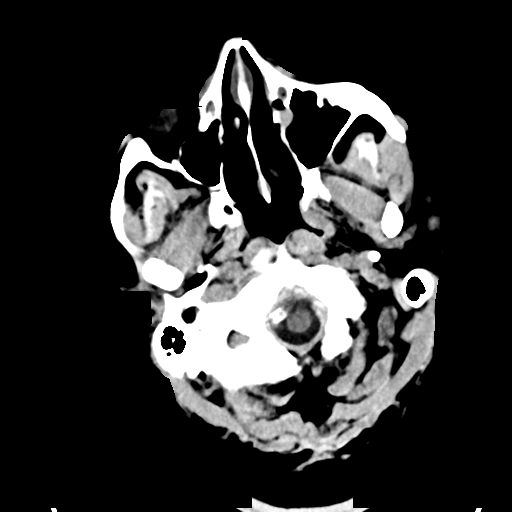
[im 5/35  bone]
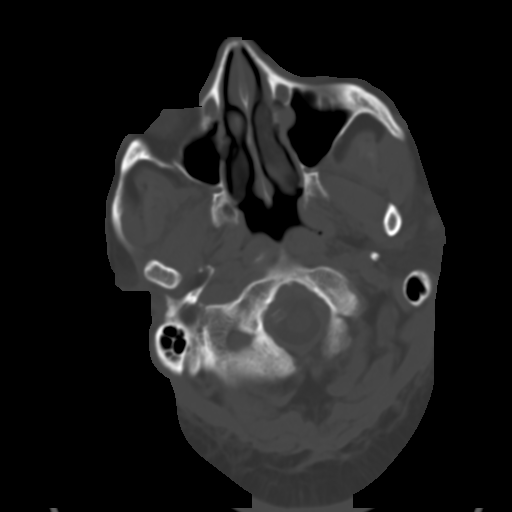
[im 9/35  brain]
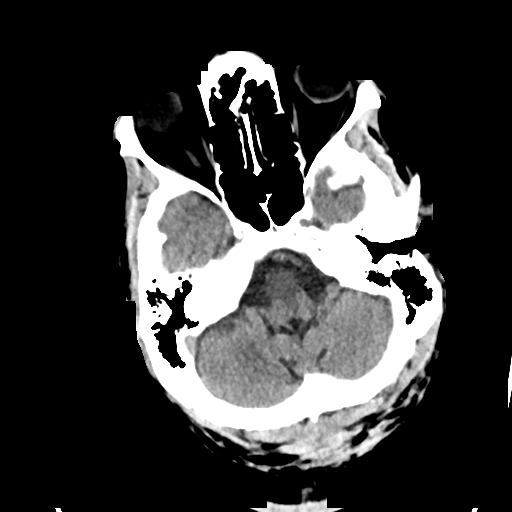
[im 13/35  brain]
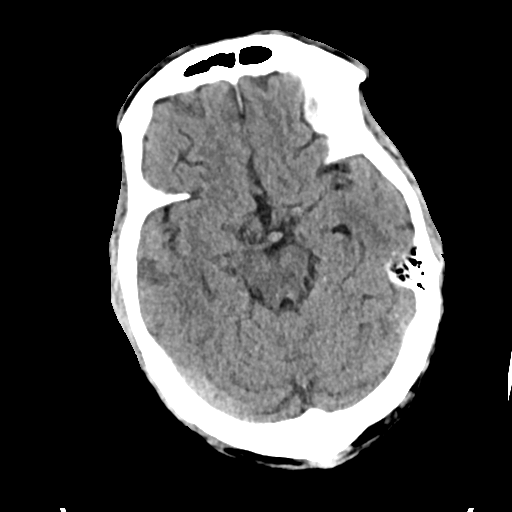
[im 18/35  brain]
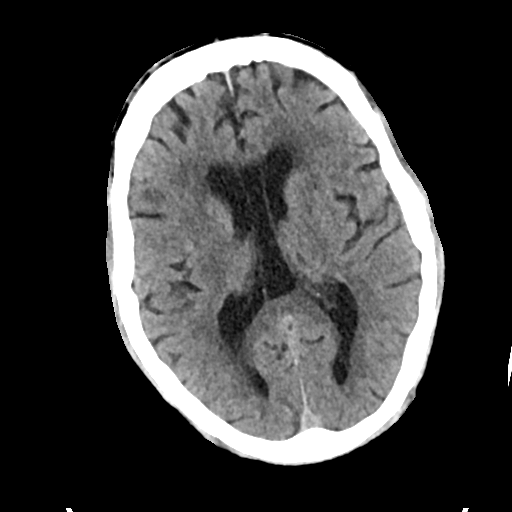
[im 22/35  brain]
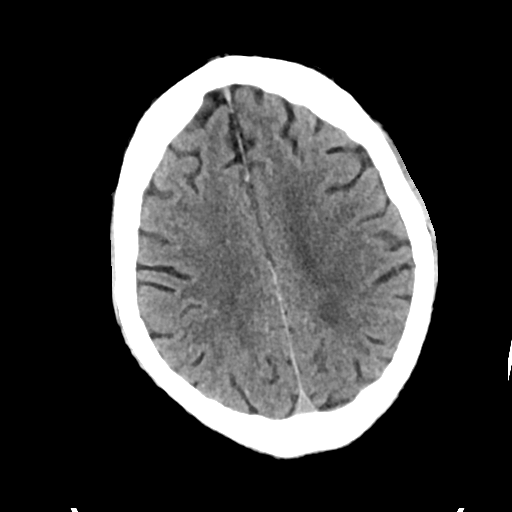
[im 22/35  bone]
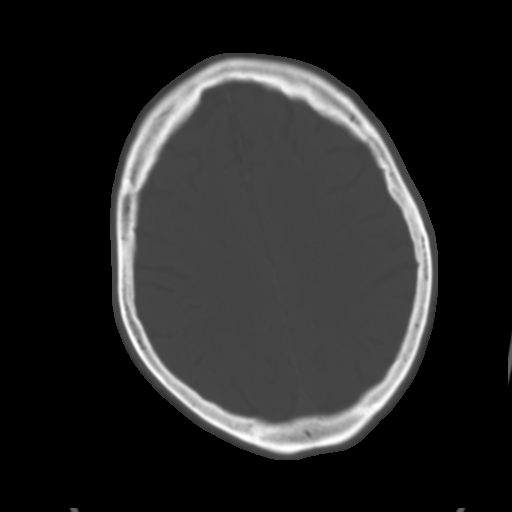
[im 26/35  brain]
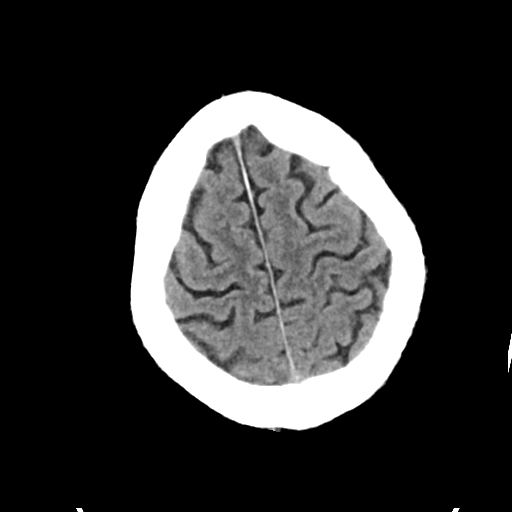
[im 30/35  brain]
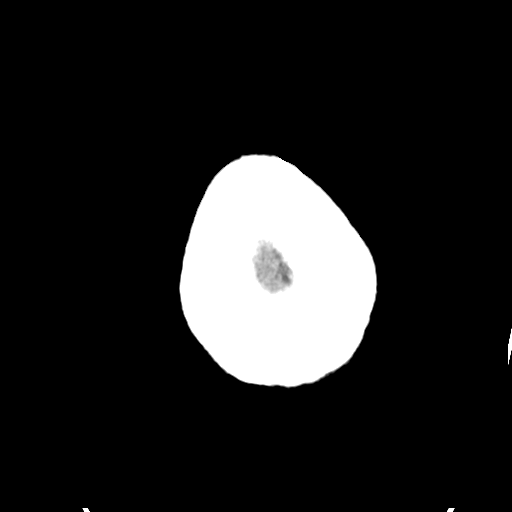

[Series 4: head bone · axial · 0.44mm/px · z∈[-136,-102]mm · 3 of 87 slices shown]
[im 9/87  bone]
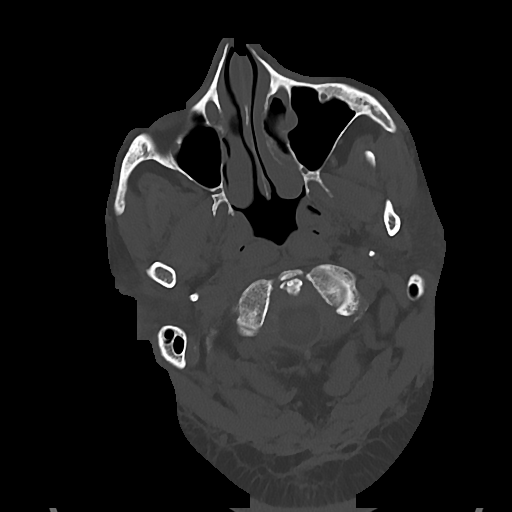
[im 18/87  bone]
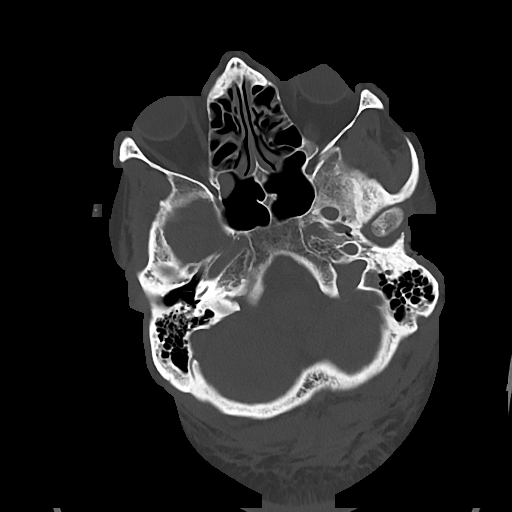
[im 26/87  bone]
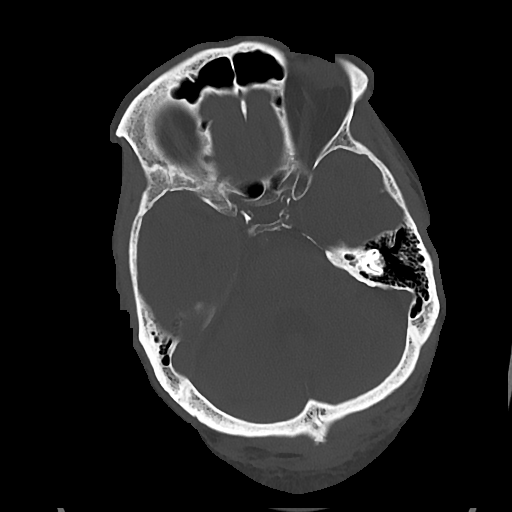

[Series 5: cor soft · coronal · 0.34mm/px · 3 of 73 slices shown]
[im 25/73  brain]
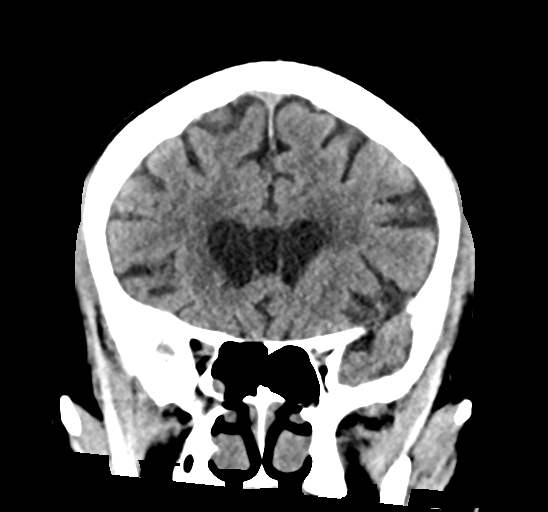
[im 33/73  brain]
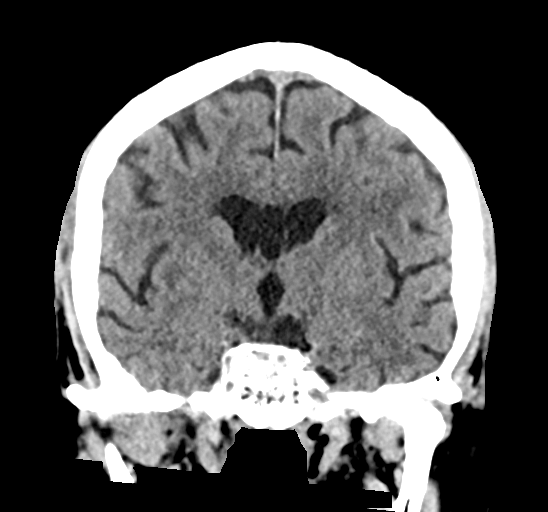
[im 41/73  brain]
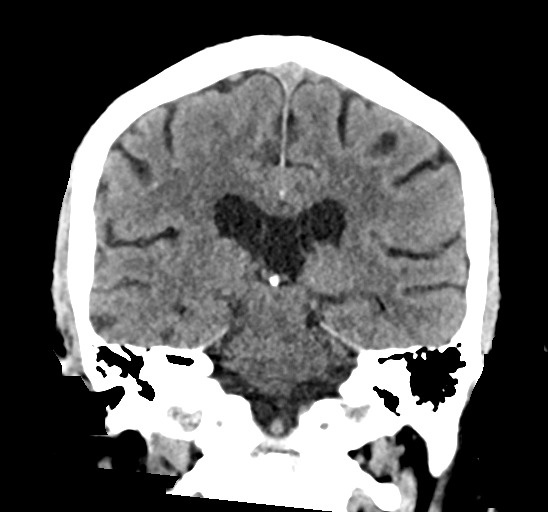

[Series 6: sag soft · sagittal · 0.34mm/px · 3 of 58 slices shown]
[im 20/58  brain]
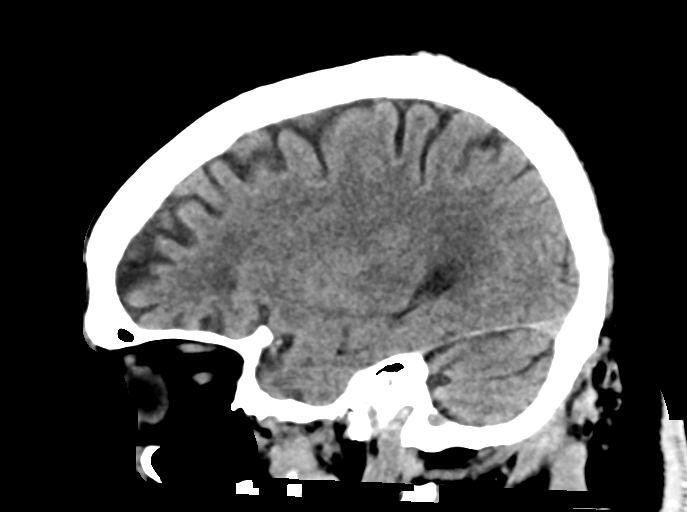
[im 29/58  brain]
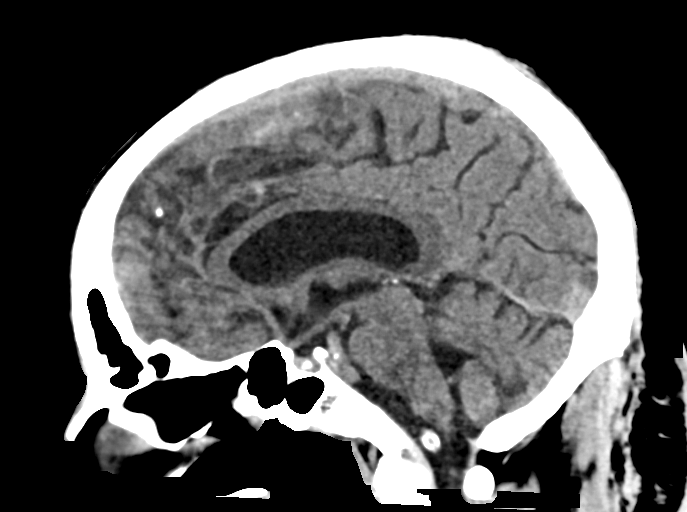
[im 39/58  brain]
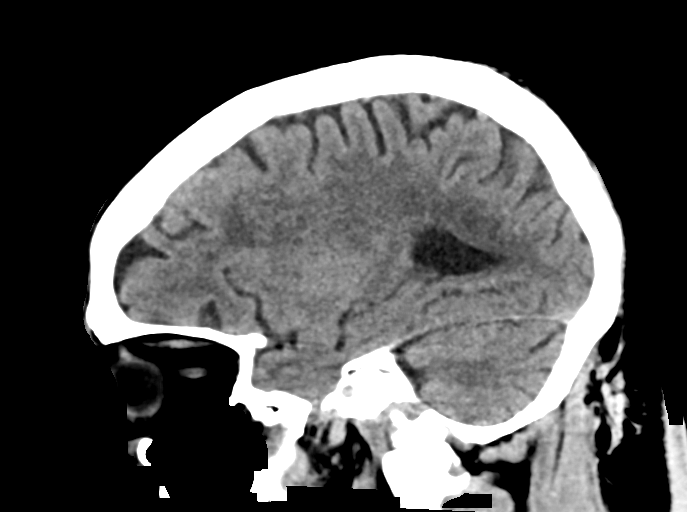

[16 of 47 positions shown; findings below may reference images not displayed]

FINDINGS: Brain: The ventricles and sulci appropriate size for patient's age.
Incidental note of a cavum septum close arm and cavum vergae. There
is mild periventricular and deep white matter chronic microvascular
ischemic changes. There is no acute intracranial hemorrhage. No mass
effect or midline shift. No extra-axial fluid collection.

Vascular: No hyperdense vessel or unexpected calcification.

Skull: Normal. Negative for fracture or focal lesion.

Sinuses/Orbits: No acute finding.

Other: None
IMPRESSION: 1. No acute intracranial pathology.
2. Mild chronic microvascular ischemic changes.

## 2019-01-03 IMAGING — DX DG CHEST 2V
2 series · 2 of 2 positions shown · non-contrast
Comparison: Chest radiograph dated 05/18/2018

CLINICAL DATA: 67-year-old male with dizziness.

EXAM:
CHEST - 2 VIEW

[chest pa]
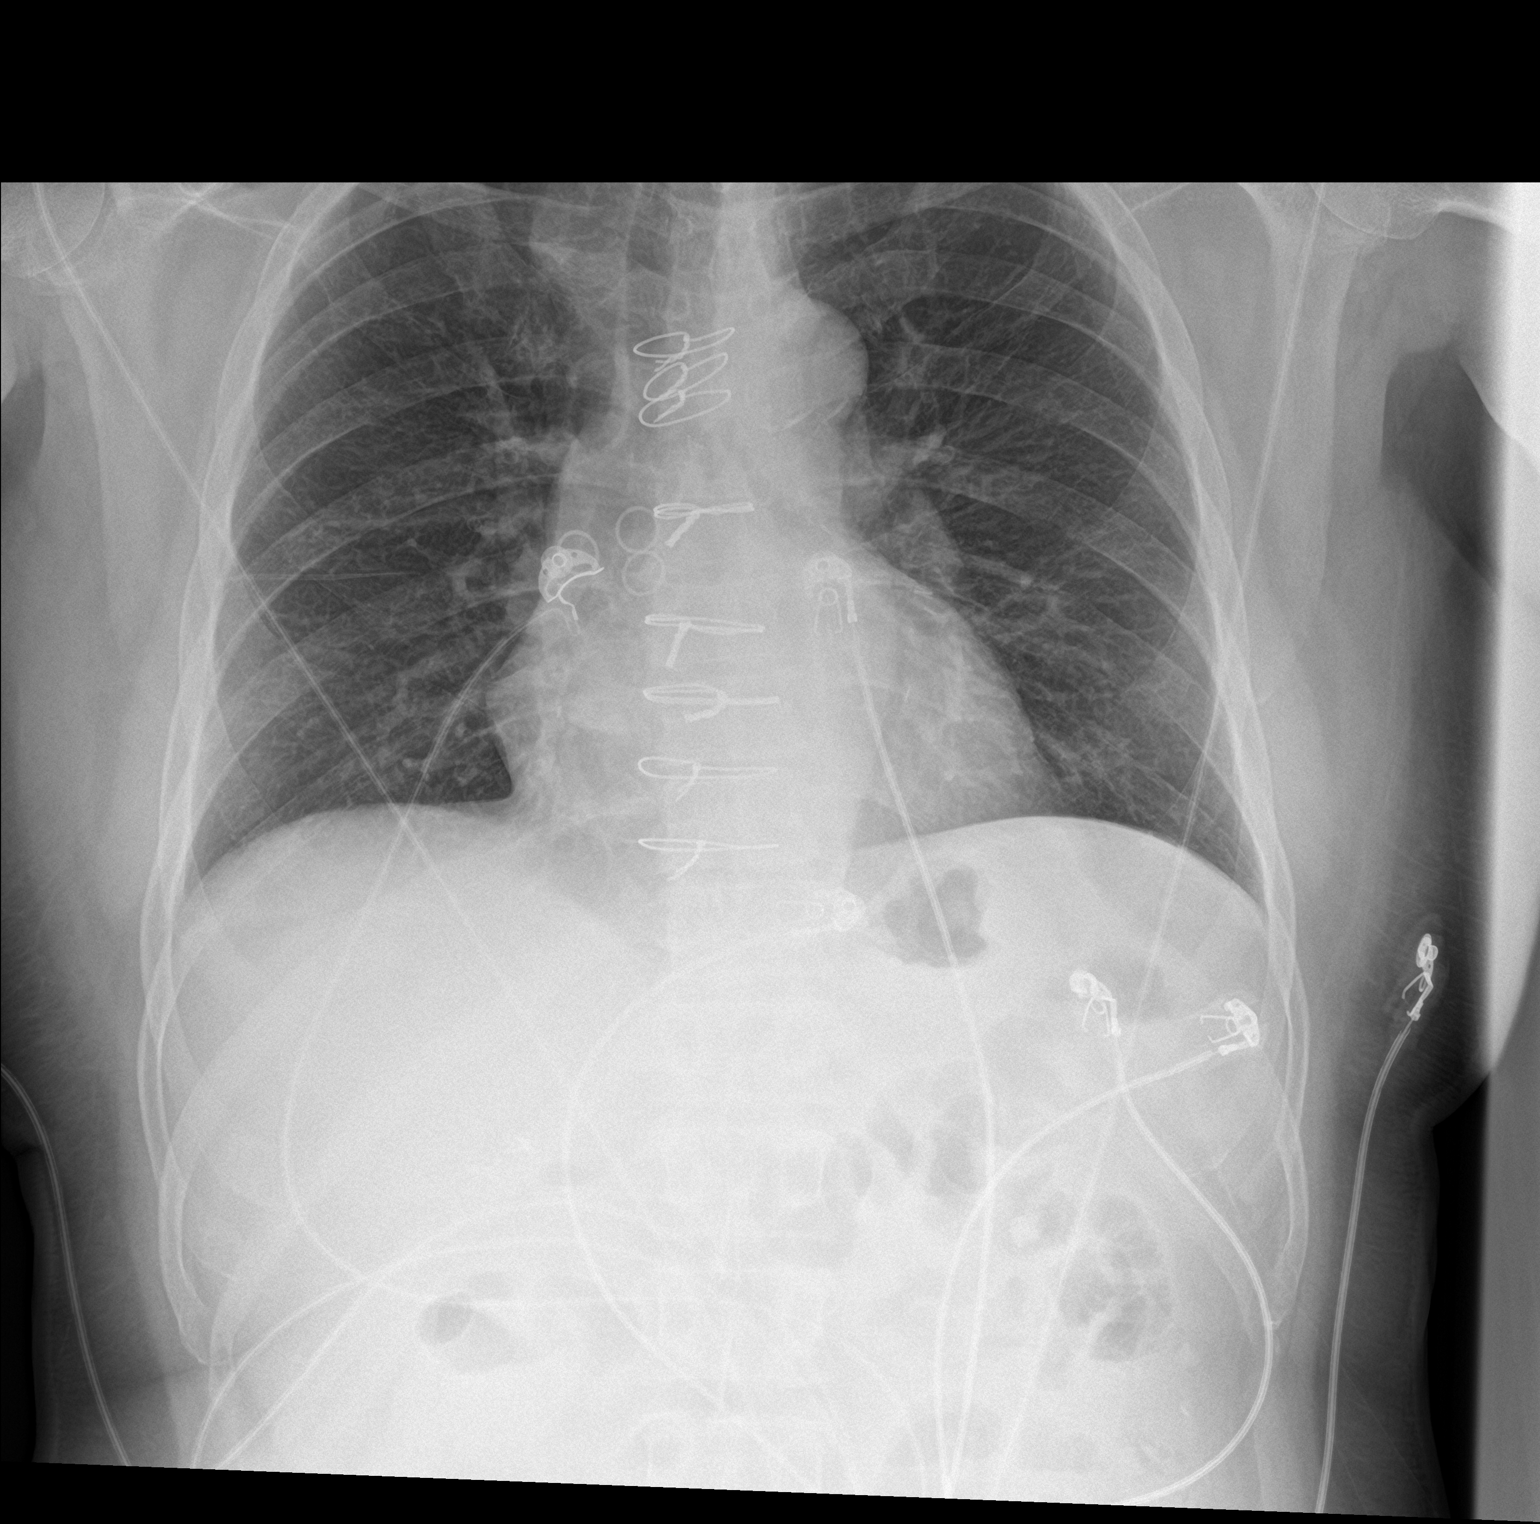

[chest lat]
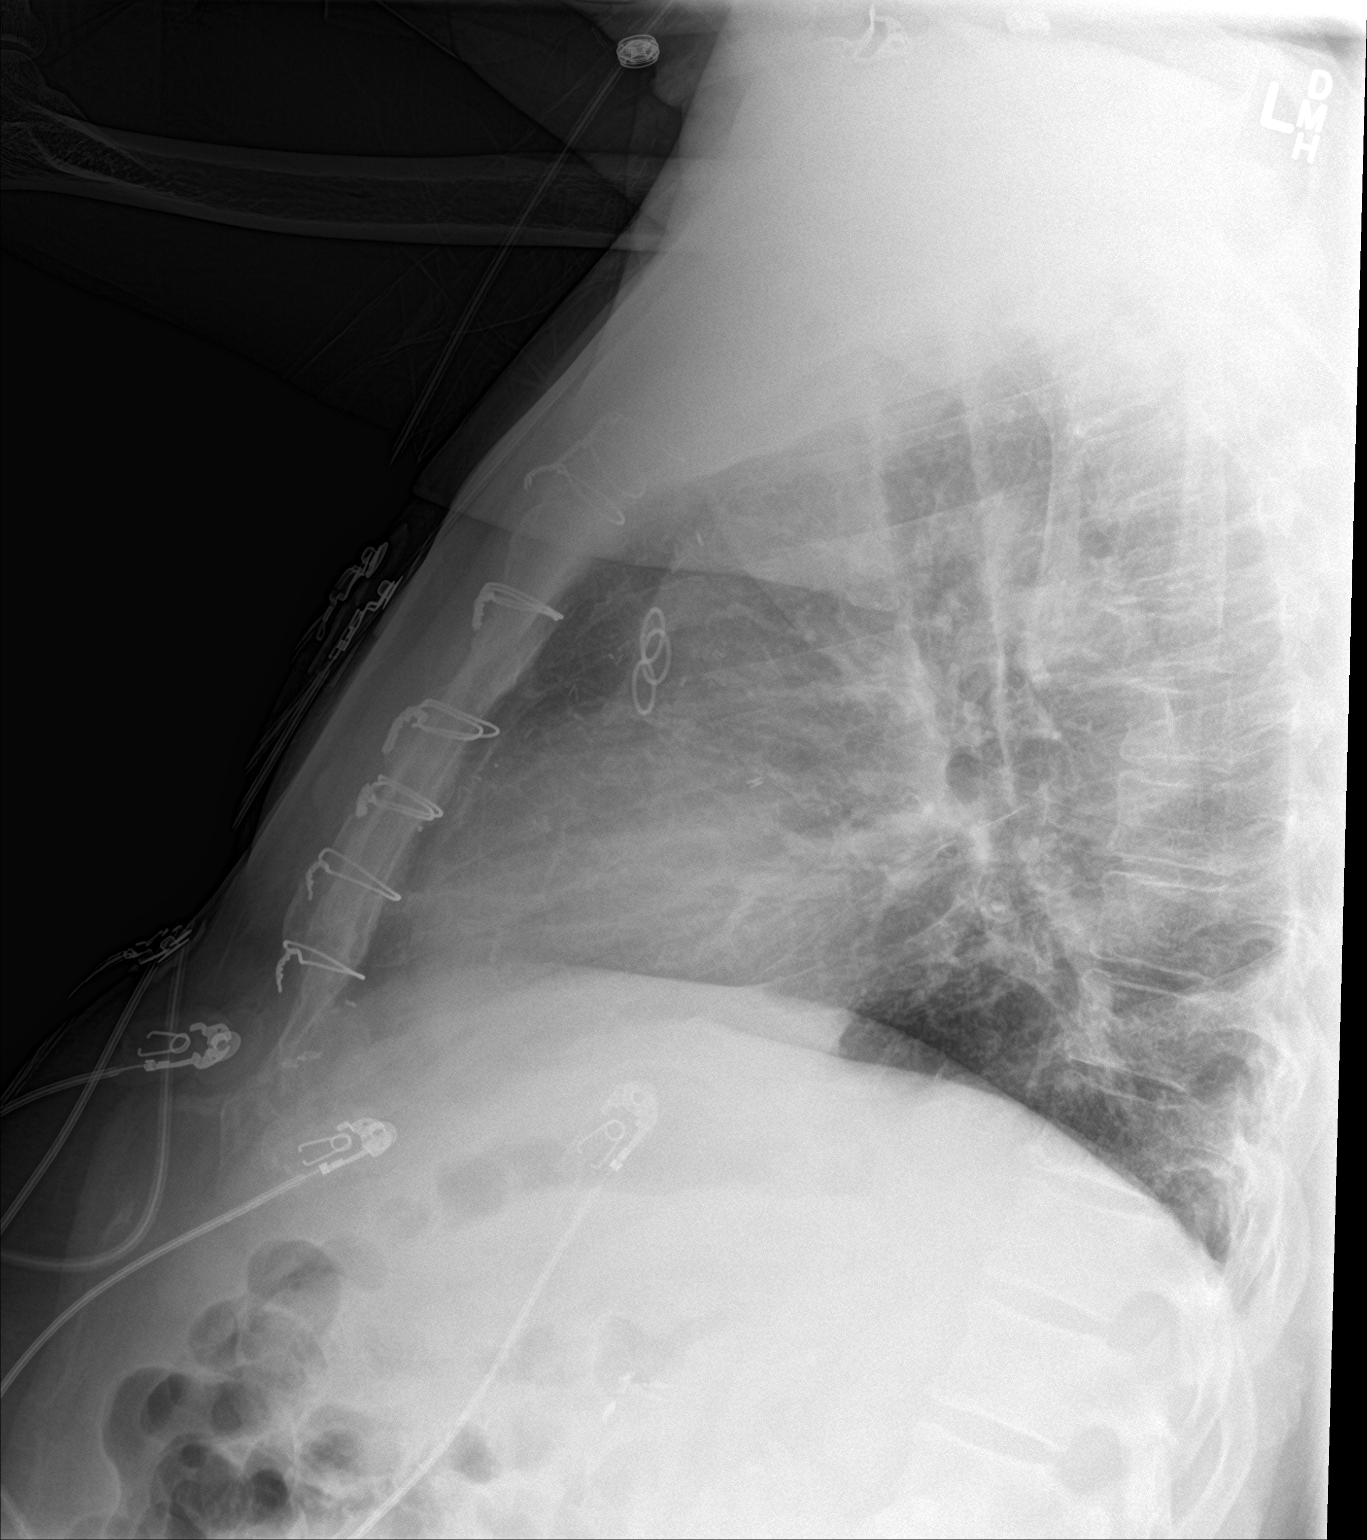

[2 of 2 positions shown; findings below may reference images not displayed]

FINDINGS: The lungs are clear. There is no pleural effusion or pneumothorax.
The cardiac silhouette is within normal limits. Median sternotomy
wires and CABG vascular clips. No acute osseous pathology.
IMPRESSION: No active cardiopulmonary disease.

## 2019-09-14 ENCOUNTER — Observation Stay (HOSPITAL_BASED_OUTPATIENT_CLINIC_OR_DEPARTMENT_OTHER): Payer: Medicare Other

## 2019-09-14 ENCOUNTER — Other Ambulatory Visit: Payer: Self-pay

## 2019-09-14 ENCOUNTER — Emergency Department (HOSPITAL_COMMUNITY): Payer: Medicare Other

## 2019-09-14 ENCOUNTER — Encounter (HOSPITAL_COMMUNITY): Payer: Self-pay | Admitting: Family Medicine

## 2019-09-14 ENCOUNTER — Inpatient Hospital Stay (HOSPITAL_COMMUNITY)
Admission: EM | Admit: 2019-09-14 | Discharge: 2019-09-22 | DRG: 291 | Disposition: A | Discharge status: Home / Self Care | Payer: Medicare Other | Attending: Family Medicine | Admitting: Family Medicine

## 2019-09-14 DIAGNOSIS — I959 Hypotension, unspecified: Secondary | ICD-10-CM | POA: Diagnosis present

## 2019-09-14 DIAGNOSIS — E1169 Type 2 diabetes mellitus with other specified complication: Secondary | ICD-10-CM

## 2019-09-14 DIAGNOSIS — Z951 Presence of aortocoronary bypass graft: Secondary | ICD-10-CM

## 2019-09-14 DIAGNOSIS — I255 Ischemic cardiomyopathy: Secondary | ICD-10-CM | POA: Diagnosis present

## 2019-09-14 DIAGNOSIS — R0600 Dyspnea, unspecified: Secondary | ICD-10-CM

## 2019-09-14 DIAGNOSIS — I2510 Atherosclerotic heart disease of native coronary artery without angina pectoris: Secondary | ICD-10-CM | POA: Diagnosis present

## 2019-09-14 DIAGNOSIS — E872 Acidosis, unspecified: Secondary | ICD-10-CM | POA: Insufficient documentation

## 2019-09-14 DIAGNOSIS — I25708 Atherosclerosis of coronary artery bypass graft(s), unspecified, with other forms of angina pectoris: Secondary | ICD-10-CM

## 2019-09-14 DIAGNOSIS — I130 Hypertensive heart and chronic kidney disease with heart failure and stage 1 through stage 4 chronic kidney disease, or unspecified chronic kidney disease: Secondary | ICD-10-CM | POA: Diagnosis not present

## 2019-09-14 DIAGNOSIS — E66811 Obesity, class 1: Secondary | ICD-10-CM | POA: Diagnosis present

## 2019-09-14 DIAGNOSIS — R0789 Other chest pain: Secondary | ICD-10-CM

## 2019-09-14 DIAGNOSIS — Z833 Family history of diabetes mellitus: Secondary | ICD-10-CM

## 2019-09-14 DIAGNOSIS — E870 Hyperosmolality and hypernatremia: Secondary | ICD-10-CM | POA: Diagnosis present

## 2019-09-14 DIAGNOSIS — I340 Nonrheumatic mitral (valve) insufficiency: Secondary | ICD-10-CM | POA: Diagnosis not present

## 2019-09-14 DIAGNOSIS — Z6833 Body mass index (BMI) 33.0-33.9, adult: Secondary | ICD-10-CM

## 2019-09-14 DIAGNOSIS — I2582 Chronic total occlusion of coronary artery: Secondary | ICD-10-CM | POA: Diagnosis present

## 2019-09-14 DIAGNOSIS — R9431 Abnormal electrocardiogram [ECG] [EKG]: Secondary | ICD-10-CM | POA: Insufficient documentation

## 2019-09-14 DIAGNOSIS — R0609 Other forms of dyspnea: Secondary | ICD-10-CM | POA: Diagnosis present

## 2019-09-14 DIAGNOSIS — Z87891 Personal history of nicotine dependence: Secondary | ICD-10-CM

## 2019-09-14 DIAGNOSIS — Z7951 Long term (current) use of inhaled steroids: Secondary | ICD-10-CM

## 2019-09-14 DIAGNOSIS — K219 Gastro-esophageal reflux disease without esophagitis: Secondary | ICD-10-CM | POA: Diagnosis present

## 2019-09-14 DIAGNOSIS — E1122 Type 2 diabetes mellitus with diabetic chronic kidney disease: Secondary | ICD-10-CM | POA: Diagnosis present

## 2019-09-14 DIAGNOSIS — Z794 Long term (current) use of insulin: Secondary | ICD-10-CM

## 2019-09-14 DIAGNOSIS — N179 Acute kidney failure, unspecified: Secondary | ICD-10-CM

## 2019-09-14 DIAGNOSIS — I252 Old myocardial infarction: Secondary | ICD-10-CM

## 2019-09-14 DIAGNOSIS — R072 Precordial pain: Secondary | ICD-10-CM

## 2019-09-14 DIAGNOSIS — Z7982 Long term (current) use of aspirin: Secondary | ICD-10-CM

## 2019-09-14 DIAGNOSIS — R079 Chest pain, unspecified: Secondary | ICD-10-CM | POA: Diagnosis not present

## 2019-09-14 DIAGNOSIS — D631 Anemia in chronic kidney disease: Secondary | ICD-10-CM | POA: Diagnosis present

## 2019-09-14 DIAGNOSIS — I25810 Atherosclerosis of coronary artery bypass graft(s) without angina pectoris: Secondary | ICD-10-CM | POA: Diagnosis present

## 2019-09-14 DIAGNOSIS — N189 Chronic kidney disease, unspecified: Secondary | ICD-10-CM

## 2019-09-14 DIAGNOSIS — Z8249 Family history of ischemic heart disease and other diseases of the circulatory system: Secondary | ICD-10-CM

## 2019-09-14 DIAGNOSIS — Z20822 Contact with and (suspected) exposure to covid-19: Secondary | ICD-10-CM | POA: Diagnosis present

## 2019-09-14 DIAGNOSIS — E669 Obesity, unspecified: Secondary | ICD-10-CM

## 2019-09-14 DIAGNOSIS — R42 Dizziness and giddiness: Secondary | ICD-10-CM

## 2019-09-14 DIAGNOSIS — D649 Anemia, unspecified: Secondary | ICD-10-CM

## 2019-09-14 DIAGNOSIS — E7800 Pure hypercholesterolemia, unspecified: Secondary | ICD-10-CM | POA: Diagnosis present

## 2019-09-14 DIAGNOSIS — E119 Type 2 diabetes mellitus without complications: Secondary | ICD-10-CM

## 2019-09-14 DIAGNOSIS — E785 Hyperlipidemia, unspecified: Secondary | ICD-10-CM | POA: Diagnosis present

## 2019-09-14 DIAGNOSIS — Z79899 Other long term (current) drug therapy: Secondary | ICD-10-CM

## 2019-09-14 DIAGNOSIS — I4581 Long QT syndrome: Secondary | ICD-10-CM | POA: Diagnosis present

## 2019-09-14 DIAGNOSIS — I5023 Acute on chronic systolic (congestive) heart failure: Secondary | ICD-10-CM

## 2019-09-14 DIAGNOSIS — I361 Nonrheumatic tricuspid (valve) insufficiency: Secondary | ICD-10-CM | POA: Diagnosis not present

## 2019-09-14 DIAGNOSIS — Z8679 Personal history of other diseases of the circulatory system: Secondary | ICD-10-CM

## 2019-09-14 DIAGNOSIS — I447 Left bundle-branch block, unspecified: Secondary | ICD-10-CM | POA: Diagnosis present

## 2019-09-14 DIAGNOSIS — I493 Ventricular premature depolarization: Secondary | ICD-10-CM | POA: Diagnosis present

## 2019-09-14 DIAGNOSIS — Z955 Presence of coronary angioplasty implant and graft: Secondary | ICD-10-CM

## 2019-09-14 DIAGNOSIS — E860 Dehydration: Secondary | ICD-10-CM | POA: Diagnosis present

## 2019-09-14 DIAGNOSIS — N1832 Chronic kidney disease, stage 3b: Secondary | ICD-10-CM | POA: Diagnosis present

## 2019-09-14 DIAGNOSIS — N400 Enlarged prostate without lower urinary tract symptoms: Secondary | ICD-10-CM | POA: Diagnosis present

## 2019-09-14 LAB — TROPONIN I (HIGH SENSITIVITY)
Troponin I (High Sensitivity): 138 ng/L (ref <18)
Troponin I (High Sensitivity): 142 ng/L (ref <18)

## 2019-09-14 LAB — CBC
HCT: 32.3 % — ABNORMAL LOW (ref 39.0–52.0)
Hemoglobin: 10.3 g/dL — ABNORMAL LOW (ref 13.0–17.0)
MCH: 28.7 pg (ref 26.0–34.0)
MCHC: 31.9 g/dL (ref 30.0–36.0)
MCV: 90 fL (ref 80.0–100.0)
Platelets: 356 10*3/uL (ref 150–400)
RBC: 3.59 MIL/uL — ABNORMAL LOW (ref 4.22–5.81)
RDW: 15.1 % (ref 11.5–15.5)
WBC: 6.3 10*3/uL (ref 4.0–10.5)
nRBC: 0 % (ref 0.0–0.2)

## 2019-09-14 LAB — BASIC METABOLIC PANEL
Anion gap: 13 (ref 5–15)
BUN: 23 mg/dL (ref 8–23)
CO2: 21 mmol/L — ABNORMAL LOW (ref 22–32)
Calcium: 9.3 mg/dL (ref 8.9–10.3)
Chloride: 109 mmol/L (ref 98–111)
Creatinine, Ser: 2.34 mg/dL — ABNORMAL HIGH (ref 0.61–1.24)
GFR calc Af Amer: 31 mL/min — ABNORMAL LOW (ref >60)
GFR calc non Af Amer: 27 mL/min — ABNORMAL LOW (ref >60)
Glucose, Bld: 72 mg/dL (ref 70–99)
Potassium: 3.7 mmol/L (ref 3.5–5.1)
Sodium: 143 mmol/L (ref 135–145)

## 2019-09-14 LAB — SODIUM, URINE, RANDOM: Sodium, Ur: 650 mmol/L

## 2019-09-14 LAB — LACTIC ACID, PLASMA
Lactic Acid, Venous: 2.1 mmol/L (ref 0.5–1.9)
Lactic Acid, Venous: 2 mmol/L (ref 0.5–1.9)
Lactic Acid, Venous: 1.1 mmol/L (ref 0.5–1.9)
Lactic Acid, Venous: 1.5 mmol/L (ref 0.5–1.9)

## 2019-09-14 LAB — ECHOCARDIOGRAM COMPLETE
Height: 64 in
Weight: 3120 oz

## 2019-09-14 LAB — LIPID PANEL
Cholesterol: 120 mg/dL (ref 0–200)
HDL: 26 mg/dL — ABNORMAL LOW (ref >40)
LDL Cholesterol: 68 mg/dL (ref 0–99)
Total CHOL/HDL Ratio: 4.6 RATIO
Triglycerides: 130 mg/dL (ref <150)
VLDL: 26 mg/dL (ref 0–40)

## 2019-09-14 LAB — GLUCOSE, CAPILLARY
Glucose-Capillary: 85 mg/dL (ref 70–99)
Glucose-Capillary: 127 mg/dL — ABNORMAL HIGH (ref 70–99)
Glucose-Capillary: 122 mg/dL — ABNORMAL HIGH (ref 70–99)

## 2019-09-14 LAB — SARS CORONAVIRUS 2 (TAT 6-24 HRS): SARS Coronavirus 2: NEGATIVE

## 2019-09-14 LAB — HEMOGLOBIN A1C
Hgb A1c MFr Bld: 12.9 % — ABNORMAL HIGH (ref 4.8–5.6)
Mean Plasma Glucose: 323.53 mg/dL

## 2019-09-14 LAB — CREATININE, URINE, RANDOM: Creatinine, Urine: 586.85 mg/dL

## 2019-09-14 LAB — HIV ANTIBODY (ROUTINE TESTING W REFLEX): HIV Screen 4th Generation wRfx: NONREACTIVE

## 2019-09-14 MED ORDER — TAMSULOSIN HCL 0.4 MG PO CAPS
0.4000 mg | ORAL_CAPSULE | Freq: Every day | ORAL | Status: DC
  Administered 2019-09-15 – 2019-09-21 (×7): 0.4 mg via ORAL
  Filled 2019-09-14 (×8): qty 1

## 2019-09-14 MED ORDER — ALUM & MAG HYDROXIDE-SIMETH 200-200-20 MG/5ML PO SUSP: 30.0000 mL | Freq: Four times a day (QID) | ORAL | Status: DC | PRN

## 2019-09-14 MED ORDER — SODIUM CHLORIDE 0.9 % IV BOLUS
250.0000 mL | Freq: Once | INTRAVENOUS | Status: AC
  Administered 2019-09-14: 250 mL via INTRAVENOUS

## 2019-09-14 MED ORDER — INSULIN GLARGINE 100 UNIT/ML ~~LOC~~ SOLN
25.0000 [IU] | Freq: Every day | SUBCUTANEOUS | Status: DC
  Administered 2019-09-14: 25 [IU] via SUBCUTANEOUS
  Filled 2019-09-14 (×2): qty 0.25

## 2019-09-14 MED ORDER — INSULIN ASPART 100 UNIT/ML ~~LOC~~ SOLN
0.0000 [IU] | Freq: Three times a day (TID) | SUBCUTANEOUS | Status: DC
  Administered 2019-09-16: 5 [IU] via SUBCUTANEOUS
  Administered 2019-09-16: 2 [IU] via SUBCUTANEOUS
  Administered 2019-09-16: 3 [IU] via SUBCUTANEOUS
  Administered 2019-09-17: 2 [IU] via SUBCUTANEOUS
  Administered 2019-09-17: 5 [IU] via SUBCUTANEOUS
  Administered 2019-09-17: 3 [IU] via SUBCUTANEOUS
  Administered 2019-09-18: 2 [IU] via SUBCUTANEOUS
  Administered 2019-09-18: 3 [IU] via SUBCUTANEOUS
  Administered 2019-09-19 – 2019-09-21 (×6): 2 [IU] via SUBCUTANEOUS

## 2019-09-14 MED ORDER — ACETAMINOPHEN 325 MG PO TABS
650.0000 mg | ORAL_TABLET | ORAL | Status: DC | PRN
  Administered 2019-09-15 – 2019-09-17 (×2): 650 mg via ORAL
  Filled 2019-09-14 (×2): qty 2

## 2019-09-14 MED ORDER — HEPARIN SODIUM (PORCINE) 5000 UNIT/ML IJ SOLN
5000.0000 [IU] | Freq: Three times a day (TID) | INTRAMUSCULAR | Status: DC
  Administered 2019-09-14 – 2019-09-22 (×25): 5000 [IU] via SUBCUTANEOUS
  Filled 2019-09-14 (×25): qty 1

## 2019-09-14 MED ORDER — SODIUM CHLORIDE 0.9 % IV SOLN: INTRAVENOUS | Status: DC

## 2019-09-14 MED ORDER — ONDANSETRON HCL 4 MG/2ML IJ SOLN: 4.0000 mg | Freq: Four times a day (QID) | INTRAMUSCULAR | Status: DC | PRN

## 2019-09-14 MED ORDER — FLUTICASONE-UMECLIDIN-VILANT 100-62.5-25 MCG/INH IN AEPB: 1.0000 | INHALATION_SPRAY | Freq: Every day | RESPIRATORY_TRACT | Status: DC

## 2019-09-14 MED ORDER — SODIUM CHLORIDE 0.9% FLUSH
3.0000 mL | Freq: Once | INTRAVENOUS | Status: AC
  Administered 2019-09-14: 3 mL via INTRAVENOUS

## 2019-09-14 MED ORDER — MECLIZINE HCL 25 MG PO TABS
25.0000 mg | ORAL_TABLET | Freq: Once | ORAL | Status: AC
  Administered 2019-09-14: 25 mg via ORAL
  Filled 2019-09-14: qty 1

## 2019-09-14 MED ORDER — METOPROLOL SUCCINATE ER 25 MG PO TB24
25.0000 mg | ORAL_TABLET | Freq: Every day | ORAL | Status: DC
  Administered 2019-09-14 – 2019-09-19 (×5): 25 mg via ORAL
  Filled 2019-09-14 (×5): qty 1

## 2019-09-14 MED ORDER — UMECLIDINIUM BROMIDE 62.5 MCG/INH IN AEPB
1.0000 | INHALATION_SPRAY | Freq: Every day | RESPIRATORY_TRACT | Status: DC
  Administered 2019-09-15 – 2019-09-22 (×7): 1 via RESPIRATORY_TRACT
  Filled 2019-09-14: qty 7

## 2019-09-14 MED ORDER — FLUTICASONE FUROATE-VILANTEROL 100-25 MCG/INH IN AEPB
1.0000 | INHALATION_SPRAY | Freq: Every day | RESPIRATORY_TRACT | Status: DC
  Administered 2019-09-15 – 2019-09-22 (×7): 1 via RESPIRATORY_TRACT
  Filled 2019-09-14: qty 28

## 2019-09-14 MED ORDER — PRASUGREL HCL 10 MG PO TABS
10.0000 mg | ORAL_TABLET | Freq: Every day | ORAL | Status: DC
  Administered 2019-09-14 – 2019-09-22 (×9): 10 mg via ORAL
  Filled 2019-09-14 (×9): qty 1

## 2019-09-14 MED ORDER — ATORVASTATIN CALCIUM 80 MG PO TABS
80.0000 mg | ORAL_TABLET | Freq: Every day | ORAL | Status: DC
  Administered 2019-09-14 – 2019-09-21 (×8): 80 mg via ORAL
  Filled 2019-09-14 (×8): qty 1

## 2019-09-14 MED ORDER — POTASSIUM CHLORIDE CRYS ER 20 MEQ PO TBCR
30.0000 meq | EXTENDED_RELEASE_TABLET | ORAL | Status: AC
  Administered 2019-09-14: 30 meq via ORAL
  Filled 2019-09-14: qty 1

## 2019-09-14 MED ORDER — ASPIRIN EC 81 MG PO TBEC
81.0000 mg | DELAYED_RELEASE_TABLET | Freq: Every day | ORAL | Status: DC
  Administered 2019-09-14 – 2019-09-22 (×9): 81 mg via ORAL
  Filled 2019-09-14 (×11): qty 1

## 2019-09-14 MED ORDER — ALBUTEROL SULFATE (2.5 MG/3ML) 0.083% IN NEBU
3.0000 mL | INHALATION_SOLUTION | RESPIRATORY_TRACT | Status: DC | PRN
  Administered 2019-09-17: 3 mL via RESPIRATORY_TRACT
  Filled 2019-09-14: qty 3

## 2019-09-14 MED ORDER — LORATADINE 10 MG PO TABS
10.0000 mg | ORAL_TABLET | Freq: Every day | ORAL | Status: DC
  Administered 2019-09-15 – 2019-09-22 (×8): 10 mg via ORAL
  Filled 2019-09-14 (×8): qty 1

## 2019-09-14 NOTE — Consult Note (Addendum)
Cardiology Consultation:   Patient ID: Mark Fernandez MRN: [id number]; DOB: 1950-09-06  Admit date: 09/14/2019 Date of Consult: 09/14/2019  Primary Care Provider: Jarome MatinPaterson, Daniel, MD Primary Cardiologist: Rollene RotundaJames Erasmus Bistline, MD  Primary Electrophysiologist:  None    Patient Profile:   Mark Fernandez is a 69 y.o. male with a hx of CAD s/p CABG (LIMA-LAD, SVG-D1, SVG-OM, SVG-RCA, 2007), EF was 45%, SVT, DM, HTN, HLD, former smoker, and intolerant to statins who is being seen today for the evaluation of chest pain and elevated troponin at the request of Dr. Antionette Charpyd.  History of Present Illness:   Mark Fernandez has a history of CAD s/p CABG in 2007 (LIMA-LAD, SVG-D1, SVG-OM, SVG-RCA). He was seen in 2017 in the setting of NSTEMI. Heart cath at that time revealed 100% stenosis of the SVG-D1 and medical therapy was recommended. He was last seen by our service in clinic 04/15/18 after he walked in requesting an appt. He had been out of his medications for a year. EF in 2019 was 40-45% on echo, previously normal in 2017.   He reported not taking any heart medication from 2018 - Jan 2020. [facility] with NSTEMI and chest pain. Heart cath with culprit lesion thought to be mid-distal Cx with 90-95% stenosis treated with DES x 2. He had patent LIMA-LAD, SVG-OM1, SVG-RCA. He was discharged on ASA, brilinta, statin, ACEI, and lisinopril.   [facility] with 2 days of chest pain, dizziness, and SOB found to have elevated troponin. EKG with progression of intraventricular conduction delay to LBBB. Upon presentation, he did not have active chest pain, but was found to have AKI to 2.3, lactic acid 2.1. History of vertigo. Was given IVF hydration in the ER. Echo pending. Cardiology was consulted.   On my interview, he is a poor historian. He saw his PCP morning of 09/12/19 but was not having chest pain until on the way home. He describes exertional chest pain that is relieved with rest. He is also  have SOB and dizziness that occur separately from his CP. CP described as a pressure without radiation and is not like his prior heart attacks, but can't tell me why/how it feels differently. He reports compliance on DAPT and other cardiac medications. He did not present to the ER on 3/23 because he thought the pain would go away. He did not sleep well that night and had continued pain the next day prompting evaluation. He did not take a nitro because he didn't think the pain was that bad, but described it as an 8/10. He also states that he fell yesterday due to dizziness, but did not lose consciousness or hit his head. He denies chest pain and palpitations prior to his fall. He also reports poor oral intake for the past several days due to decreased appetite.  On arrival, he was hypotensive in the 90s systolic. He has not had a recurrence of chest pain since arrival. In addition to hypotension, he has AKI and elevated lacitc acid, as above.    Past Medical History:  Diagnosis Date  . Arrhythmia    NSVT  . Bell's palsy   . Cardiomyopathy    EF 45%  . Coronary artery disease    2007 status post CABG X 4  (left internal mammary artery to left anterior descending, saphenous vein graft to diagonal, saphenous  vein graft to obtuse marginal, saphenous vein graft to right coronary   artery).  . Diabetes mellitus  type II  . History of tobacco use   . Hypercholesteremia   . Hypertension   . SVT (supraventricular tachycardia) (HCC)     Past Surgical History:  Procedure Laterality Date  . CARDIAC CATHETERIZATION N/A 12/10/2015   Procedure: Left Heart Cath and Cors/Grafts Angiography;  Surgeon: Troy Sine, MD;  Location: [facility];  Service: Cardiovascular;  Laterality: N/A;  . CATARACT EXTRACTION, BILATERAL    . CHOLECYSTECTOMY    . CORONARY ARTERY BYPASS GRAFT    . LITHOTRIPSY       Home Medications:  Prior to Admission medications   Medication Sig Start Date End Date Taking?  Authorizing Provider  albuterol (VENTOLIN HFA) 108 (90 Base) MCG/ACT inhaler Inhale 2 puffs into the lungs every 4 (four) hours as needed for wheezing or shortness of breath.  09/12/19  Yes [provider]  aspirin EC 81 MG EC tablet Take 1 tablet (81 mg total) by mouth daily. 12/11/15  Yes Cheryln Manly, NP  atorvastatin (LIPITOR) 80 MG tablet Take 1 tablet (80 mg total) by mouth daily at 6 PM. 04/15/18  Yes Barrett, Evelene Croon, PA-C  cetirizine (ZYRTEC) 10 MG tablet Take 10 mg by mouth daily.   Yes [provider]  ENTRESTO 49-51 MG Take 1 tablet by mouth 2 (two) times daily. 09/02/19  Yes [provider]  furosemide (LASIX) 40 MG tablet Take 40 mg by mouth daily.   Yes [provider]  insulin glargine (LANTUS) 100 UNIT/ML injection Inject 55 Units into the skin at bedtime.   Yes [provider]  liraglutide (VICTOZA) 18 MG/3ML SOPN Inject 1.2 mg into the skin daily.   Yes [provider]  lisinopril (ZESTRIL) 20 MG tablet Take 20 mg by mouth daily.   Yes [provider]  metoprolol succinate (TOPROL-XL) 50 MG 24 hr tablet Take 25 mg by mouth daily. Take with or immediately following a meal.   Yes [provider]  Multiple Vitamins-Minerals (MULTIVITAMIN WITH MINERALS) tablet Take 1 tablet by mouth daily.   Yes [provider]  nitroGLYCERIN (NITROSTAT) 0.4 MG SL tablet Place 1 tablet (0.4 mg total) under the tongue every 5 (five) minutes x 3 doses as needed for chest pain. 04/15/18  Yes Barrett, Evelene Croon, PA-C  pantoprazole (PROTONIX) 40 MG tablet Take 1 tablet (40 mg total) by mouth daily. 04/15/18  Yes Barrett, Evelene Croon, PA-C  potassium chloride SA (KLOR-CON) 20 MEQ tablet Take 20 mEq by mouth daily.   Yes [provider]  prasugrel (EFFIENT) 10 MG TABS tablet Take 10 mg by mouth daily. 07/06/19  Yes [provider]  tamsulosin (FLOMAX) 0.4 MG CAPS capsule Take 1 capsule (0.4 mg total) by mouth  daily after supper. 05/18/18  Yes Thurnell Lose, MD  carvedilol (COREG) 12.5 MG tablet Take 1 tablet (12.5 mg total) by mouth 2 (two) times daily with a meal. Patient not taking: Reported on 09/14/2019 04/15/18   Barrett, Evelene Croon, PA-C  isosorbide mononitrate (IMDUR) 30 MG 24 hr tablet Take 1 tablet (30 mg total) by mouth daily. Patient not taking: Reported on 09/14/2019 04/15/18   Barrett, Evelene Croon, PA-C  meclizine (ANTIVERT) 12.5 MG tablet Take 1 tablet (12.5 mg total) by mouth 2 (two) times daily as needed for dizziness. Patient not taking: Reported on 09/14/2019 05/25/18   Masoudi, Dorthula Rue, MD  TRELEGY ELLIPTA 100-62.5-25 MCG/INH AEPB Inhale 1 puff into the lungs daily. 09/12/19   [provider]  Inpatient Medications: Scheduled Meds: . aspirin  81 mg Oral Daily  . atorvastatin  80 mg Oral q1800  . fluticasone furoate-vilanterol  1 puff Inhalation Daily   And  . umeclidinium bromide  1 puff Inhalation Daily  . heparin  5,000 Units Subcutaneous Q8H  . insulin aspart  0-15 Units Subcutaneous TID WC  . insulin glargine  25 Units Subcutaneous QHS  . loratadine  10 mg Oral Daily  . potassium chloride  30 mEq Oral STAT  . tamsulosin  0.4 mg Oral QPC supper   Continuous Infusions: . sodium chloride 50 mL/hr at 09/14/19 0949   PRN Meds: acetaminophen, albuterol, alum & mag hydroxide-simeth, ondansetron (ZOFRAN) IV  Allergies:   No Known Allergies  Social History:   Social History   Socioeconomic History  . Marital status: Widowed    Spouse name: Not on file  . Number of children: Not on file  . Years of education: Not on file  . Highest education level: Not on file  Occupational History  . Not on file  Tobacco Use  . Smoking status: Former Smoker    Quit date: 02/10/2003    Years since quitting: 16.6  . Smokeless tobacco: Never Used  Substance and Sexual Activity  . Alcohol use: Yes  . Drug use: No  . Sexual activity: Not on file  Other Topics Concern   . Not on file  Social History Narrative    Single with 4 children, 7 grandchildren.  He does not    exercise, but works with the [employer].  Continues to smoke 1/2 to    a whole pack a day and does do social alcohol.         Social Determinants of Health   Financial Resource Strain:   . Difficulty of Paying Living Expenses:   Food Insecurity:   . Worried About Programme researcher, broadcasting/film/video in the Last Year:   . Barista in the Last Year:   Transportation Needs:   . Freight forwarder (Medical):   Marland Kitchen Lack of Transportation (Non-Medical):   Physical Activity:   . Days of Exercise per Week:   . Minutes of Exercise per Session:   Stress:   . Feeling of Stress :   Social Connections:   . Frequency of Communication with Friends and Family:   . Frequency of Social Gatherings with Friends and Family:   . Attends Religious Services:   . Active Member of Clubs or Organizations:   . Attends Banker Meetings:   Marland Kitchen Marital Status:   Intimate Partner Violence:   . Fear of Current or Ex-Partner:   . Emotionally Abused:   Marland Kitchen Physically Abused:   . Sexually Abused:     Family History:    Family History  Problem Relation Age of Onset  . Hypertension Mother   . Diabetes Mother      ROS:  Please see the history of present illness.   All other ROS reviewed and negative.     Physical Exam/Data:   Vitals:   09/14/19 0815 09/14/19 0935 09/14/19 1007 09/14/19 1231  BP: (!) 138/125 100/65 107/88 100/70  Pulse: (!) 51 71 73 72  Resp:  20 (!) 22 (!) 22  Temp:  98.2 F (36.8 C) 98.4 F (36.9 C) 97.9 F (36.6 C)  TempSrc:  Oral Oral Oral  SpO2: 96% 100% 99% 99%  Weight:      Height:  Intake/Output Summary (Last 24 hours) at 09/14/2019 1237 Last data filed at 09/14/2019 0643 Gross per 24 hour  Intake 937.5 ml  Output --  Net 937.5 ml   Last 3 Weights 09/14/2019 05/25/2018 05/15/2018  Weight (lbs) 195 lb 192 lb 14.4 oz 192 lb 14.4 oz  Weight (kg)  88.451 kg 87.5 kg 87.5 kg     Body mass index is 33.47 kg/m.  General:  Obese male in NAD HEENT: normal Neck: no JVD Vascular: No carotid bruits Cardiac:  normal S1, S2; RRR; no murmur  Lungs:  clear to auscultation bilaterally, no wheezing, rhonchi or rales  Abd: soft, nontender, no hepatomegaly  Ext: no edema Musculoskeletal:  No deformities, BUE and BLE strength normal and equal Skin: warm and dry  Neuro:  CNs 2-12 intact, no focal abnormalities noted Psych:  Normal affect   EKG:  The EKG was personally reviewed and demonstrates:  Sinus rhythm with HR 78 and LBBB Telemetry:  Telemetry was personally reviewed and demonstrates:  Sinus rhythm, 70-80s, PVCs, LBBB  Relevant CV Studies:  Left heart cath 12/10/15:  Ost LAD to Prox LAD lesion, 70% stenosed.  Mid RCA lesion, 100% stenosed.  2nd Mrg lesion, 99% stenosed.  Lat 1st Mrg lesion, 99% stenosed.  1st Mrg lesion, 80% stenosed.  LIMA .  Dist LAD lesion, 80% stenosed.  Mid Cx lesion, 80% stenosed.  Prox RCA lesion, 30% stenosed.  SVG .  Prox Graft lesion, 40% stenosed.  Ost RPDA to RPDA lesion, 95% stenosed.  SVG .  Origin lesion, 100% stenosed.  SVG .  The LIMA graft was widely patent and anastomosed into the mid LAD. The LAD beyond the anastomosis was small caliber. There was diffuse narrowing of 80% in the mid distal LAD segment.  The SVG to the distal RCA was a large graft. There was 40% smooth eccentric proximal stenosis focally. The graft anastomosing to the distal RCA. The first PDA branch was diffusely narrowed 99%. The distal RCA and is in a small posterolateral branch.  The SVG to the diagonal vessel was totally occluded at its origin.  The SVG supplying the marginal vessel was widely patent. There was narrowing in a branch of the marginal after the anastomosis of about 70%.    Severe multi-vessel native LAD disease with diffuse 70% proximal LAD stenosis; 90 and 99% stenoses in the OM1 and OM 2  branches of the circumflex coronary artery with 80% mid AV groove circumflex stenosis after the takeoff of the second marginal branch; 30% proximal in total occlusion of the mid RCA after an RV marginal branch.  Patent LIMA graft supplying the mid LAD but with diffusely diseased small caliber LAD with narrowing of 80% in the mid distal segment.  Patent SVG to circumflex marginal branch.  Occluded SVG which  previously supplied the diagonal vessel.  Pain SVG to the distal RCA, but with 40% smooth eccentric focal proximal stenosis in the proximal segment of the graft and severe diffuse subtotal PDA stenosis in the native RCA beyond the anastomosis.  LVEDP 20 mmHg.  RECOMMENDATION: Increased medical therapy.  The patient is already on amlodipine as well as carvedilol.  With diffuse native CAD consider the addition of Ranexa and oral nitrates.  A 2-D echo Doppler study will be ordered to assess LV function.  The patient will be hydrated post procedure with his underlying renal insufficiency.   Left heart cath [facility] 11/30/18: Severe 90% mid Lcx and 95% distal Lcx stenosis-culprit vessel s/p DES x  2: Mid: 2.5 x 12 DES Resolute Onyx, Distal: 2.0 x 8 mm DES Resolute Onyx Patent LIMA-LAD Patent SVG-Om1 Patent SVG-RCA Total occlusion of native mid LAD and mid RCA  Recommendations:  Continue DAPT with aspirin and ticagrelor Continue CAD medical therapy  Laboratory Data:  High Sensitivity Troponin:   Recent Labs  Lab 09/14/19 0129 09/14/19 0415  TROPONINIHS 138* 142*     Chemistry Recent Labs  Lab 09/14/19 0129  NA 143  K 3.7  CL 109  CO2 21*  GLUCOSE 72  BUN 23  CREATININE 2.34*  CALCIUM 9.3  GFRNONAA 27*  GFRAA 31*  ANIONGAP 13    No results for input(s): PROT, ALBUMIN, AST, ALT, ALKPHOS, BILITOT in the last 168 hours. Hematology Recent Labs  Lab 09/14/19 0129  WBC 6.3  RBC 3.59*  HGB 10.3*  HCT 32.3*  MCV 90.0  MCH 28.7  MCHC 31.9  RDW 15.1  PLT 356    BNPNo results for input(s): BNP, PROBNP in the last 168 hours.  DDimer No results for input(s): DDIMER in the last 168 hours.   Radiology/Studies:  DG Chest 2 View  Result Date: 09/14/2019 CLINICAL DATA:  Chest pain EXAM: CHEST - 2 VIEW COMPARISON:  May 25, 2018 FINDINGS: The heart size and mediastinal contours are mildly enlarged. Aortic knob calcifications. Overlying median sternotomy wires. No large airspace consolidation or pleural effusion. No acute osseous abnormality. IMPRESSION: No active cardiopulmonary disease. Electronically Signed   By: Jonna Clark M.D.   On: 09/14/2019 02:19       TIMI Risk Score for Unstable Angina or Non-ST Elevation MI:   The patient's TIMI risk score is 6, which indicates a 41% risk of all cause mortality, new or recurrent myocardial infarction or need for urgent revascularization in the next 14 days.   Assessment and Plan:   1. Chest pain with known CAD, SOB 2. Elevated troponin 3. DES x 2 to Cx 11/2018 - hs troponin 138 --> 142 - EKG with LBBB - new - compliant on ASA/brilinta - chest pain is concerning for unstable angina, but he is also hypotensive with AKI, and lactic acid - given mild elevation in CE, would not pursue angiography today - agree with echo today - next steps depending on results - will need to reconcile his medications as entresto is also listed with lisinopril; spironolactone, imdur, coreg, lopressor, and daily lasix/K are listed as home medications - appreciate help from pharmacy to reconcile medications from [facility]CP and [facility]   4. LBBB 5. Dizziness, hypotension - consider zio patch placed prior to discharge - he does report a fall due to dizziness yesterday - hold ACEI, entresto, lasix, and spironolactone for now   6. AKI - sCr 2.3 - baseline appears to be 1.1-1.2 - hold off on contrasted studies for now   7. Anemia - Hb 10.3 - may be contributing to his dizziness, but this appears to be his baseline - check FOBT  card since he is on DAPT - per primary   8. Uncontrolled DM - A1c is 12.9%   9. Hyperlipidemia 09/14/2019: Cholesterol 120; HDL 26; LDL Cholesterol 68; Triglycerides 130; VLDL 26 - at goal - continue statin   10. Chronic systolic heart failure - EF was 30% 11/2018 following last PCI - appears dry on exam today - holding ACE/ARB/entresto/lasix for today - pending echo results      For questions or updates, please contact [facility] Please consult [email or web address] for contact info under  Signed, Marcelino Duster, [place]  09/14/2019 12:37 PM   History and all data above reviewed.  Patient examined.  I agree with the findings as above.  The patient presented primarily with weakness.  He said that he called EMS because he could not make his legs move.  He did not call because of chest pain although he does describe this.  He reports a sharp substernal pain with no radiation.  He did not take NTG at home.  He does not usually have to use NTG.  He doesn't report pain usually although he remembers his symptoms prior to his CABG.  He is not sure whether this is similar to his previous.  He now does have a LBBB.  Prior was an incomplete LBBB.  He has a mild trop elevation.  However, he has acute renal insufficiency.  He was hypotensive on presentation.  Since admission he has felt fine.  The patient denies any new symptoms such neck or arm discomfort. There has been no new shortness of breath, PND or orthopnea. There have been no reported palpitations, presyncope or syncope.  He reports acute weakness however.  The patient exam reveals COR:RRR  ,  Lungs: Clear  ,  Abd: Positive bowel sounds, no rebound no guarding, Ext No edema  .  All available labs, radiology testing, previous records reviewed. Agree with documented assessment and plan.   Chest pain:  Difficult to sort out.  He has diffuse native vessel disease.  He is not a cath candidate with his AKI.  I don't think that a perfusion study  would be helpful as it will be abnormal.  I would not suggest repeat evaluation pending resolution of his AKI.  Medical management.  If he has recurrent symptoms he could have follow up invasive imaging by his cardiologist at [facility].   Mark Fernandez  1:47 PM  09/14/2019

## 2019-09-14 NOTE — ED Triage Notes (Signed)
BIB EMS from home. Reports CP X2 days. States pain has continued throughout the night with dizziness/SOB. Seen at PCP earlier yesterday. Per EMS pt hypotensive en route. BP 88/64 upon arrival to ED.

## 2019-09-14 NOTE — ED Provider Notes (Signed)
[facility] EMERGENCY DEPARTMENT Provider Note   CSN: [id number] Arrival date & time: 09/14/19  0122     History Chief Complaint  Patient presents with  . Chest Pain    Mark Fernandez is a 69 y.o. male.  HPI     This is a 69 year old male who comes in with multiple complaints.  He is originally brought in by EMS for complaints of 2 days of chest pain.  He also states he generally does not feel well and has been dizzy and short of breath.  He reports seeing his primary physician on 3/23 and told that it might be related to his diabetes.  He does have a history of vertigo.  Patient is not currently have any active chest pain.  He reports dyspnea on exertion and room spinning dizziness.  Denies any weakness, numbness, tingling.  He states it is difficult to ambulate at times.  He has not taken anything for his symptoms.  He denies any recent fevers, cough, abdominal pain, nausea, vomiting.  Of note, EKG noted to have a new left bundle branch block.  He had a cardiac catheterization at [facility] on 11/2018 based on chart review.  EKG at that time was read without a branch block although I cannot view the images.  He did have 2 drug-eluting stents at that time.  Last EF 30%.  Past Medical History:  Diagnosis Date  . Arrhythmia    NSVT  . Bell's palsy   . Cardiomyopathy    EF 45%  . Coronary artery disease    2007 status post CABG X 4  (left internal mammary artery to left anterior descending, saphenous vein graft to diagonal, saphenous  vein graft to obtuse marginal, saphenous vein graft to right coronary   artery).  . Diabetes mellitus    type II  . History of tobacco use   . Hypercholesteremia   . Hypertension   . SVT (supraventricular tachycardia) [facility])     Patient Active Problem List   Diagnosis Date Noted  . DKA (diabetic ketoacidosis) (HCC) 05/15/2018  . Orthostatic dizziness 05/15/2018  . Chest pain 12/09/2015  . Abdominal pain 12/09/2015  .  Diabetes (HCC) 12/09/2015  . NSTEMI (non-ST elevated myocardial infarction) (HCC) 12/09/2015  . Overweight(278.02) 02/10/2011  . Hypertension   . Coronary artery disease   . Cardiomyopathy (HCC)   . Hypercholesteremia   . Arrhythmia    / Past Surgical History:  Procedure Laterality Date  . CARDIAC CATHETERIZATION N/A 12/10/2015   Procedure: Left Heart Cath and Cors/Grafts Angiography;  Surgeon: Lennette Bihari, MD;  Location: [facility] INVASIVE CV LAB;  Service: Cardiovascular;  Laterality: N/A;  . CATARACT EXTRACTION, BILATERAL    . CHOLECYSTECTOMY    . CORONARY ARTERY BYPASS GRAFT    . LITHOTRIPSY         Family History  Problem Relation Age of Onset  . Hypertension Mother   . Diabetes Mother     Social History   Tobacco Use  . Smoking status: Former Smoker    Quit date: 02/10/2003    Years since quitting: 16.6  . Smokeless tobacco: Never Used  Substance Use Topics  . Alcohol use: Yes  . Drug use: No    Home Medications Prior to Admission medications   Medication Sig Start Date End Date Taking? Authorizing Provider  cetirizine (ZYRTEC) 10 MG tablet Take 10 mg by mouth daily.   Yes [provider]  albuterol (VENTOLIN HFA) 108 (90  Base) MCG/ACT inhaler Inhale 2 puffs into the lungs every 4 (four) hours as needed for wheezing or shortness of breath.  09/12/19   [provider]  aspirin EC 81 MG EC tablet Take 1 tablet (81 mg total) by mouth daily. 12/11/15   Arty Baumgartner, NP  atorvastatin (LIPITOR) 80 MG tablet Take 1 tablet (80 mg total) by mouth daily at 6 PM. 04/15/18   Barrett, Joline Salt, PA-C  carvedilol (COREG) 12.5 MG tablet Take 1 tablet (12.5 mg total) by mouth 2 (two) times daily with a meal. Patient not taking: Reported on 09/14/2019 04/15/18   Barrett, Joline Salt, PA-C  ENTRESTO 49-51 MG Take 1 tablet by mouth 2 (two) times daily. 09/02/19   [provider]  HUMULIN 70/30 (70-30) 100 UNIT/ML injection Inject 45 Units into the skin 2 (two)  times daily.  11/05/15   [provider]  isosorbide mononitrate (IMDUR) 30 MG 24 hr tablet Take 1 tablet (30 mg total) by mouth daily. 04/15/18   Barrett, Joline Salt, PA-C  meclizine (ANTIVERT) 12.5 MG tablet Take 1 tablet (12.5 mg total) by mouth 2 (two) times daily as needed for dizziness. 05/25/18   Masoudi, Shawna Orleans, MD  metoprolol succinate (TOPROL-XL) 100 MG 24 hr tablet Take 100 mg by mouth daily. 09/02/19   [provider]  nitroGLYCERIN (NITROSTAT) 0.4 MG SL tablet Place 1 tablet (0.4 mg total) under the tongue every 5 (five) minutes x 3 doses as needed for chest pain. 04/15/18   Barrett, Joline Salt, PA-C  pantoprazole (PROTONIX) 40 MG tablet Take 1 tablet (40 mg total) by mouth daily. 04/15/18   Barrett, Joline Salt, PA-C  spironolactone (ALDACTONE) 25 MG tablet Take 25 mg by mouth daily. 07/27/19   [provider]  tamsulosin (FLOMAX) 0.4 MG CAPS capsule Take 1 capsule (0.4 mg total) by mouth daily after supper. 05/18/18   Leroy Sea, MD  TRELEGY ELLIPTA 100-62.5-25 MCG/INH AEPB Inhale 1 puff into the lungs daily. 09/12/19   [provider]  TRULICITY 0.75 MG/0.5ML SOPN Inject 0.75 mg into the skin every Friday.  09/12/19   [provider]    Allergies    Patient has no known allergies.  Review of Systems   Review of Systems  Constitutional: Negative for fever.  Respiratory: Positive for shortness of breath. Negative for cough.   Cardiovascular: Positive for chest pain. Negative for leg swelling.  Gastrointestinal: Negative for abdominal pain, nausea and vomiting.  Genitourinary: Negative for dysuria.  Musculoskeletal: Negative for back pain.  Neurological: Positive for dizziness.  All other systems reviewed and are negative.   Physical Exam Updated Vital Signs BP 109/73   Pulse 74   Temp 97.8 F (36.6 C) (Oral)   Resp 19   Ht 1.626 m (5\' 4" )   Wt 88.5 kg   SpO2 99%   BMI 33.47 kg/m   Physical Exam Vitals and nursing note  reviewed.  Constitutional:      Appearance: He is well-developed.     Comments: Chronically ill-appearing but nontoxic, no acute distress  HENT:     Head: Normocephalic and atraumatic.  Eyes:     Pupils: Pupils are equal, round, and reactive to light.  Cardiovascular:     Rate and Rhythm: Normal rate and regular rhythm.     Heart sounds: Murmur present.  Pulmonary:     Effort: Pulmonary effort is normal. No respiratory distress.     Breath sounds: Normal breath sounds. No wheezing.  Chest:  Comments: Well-healed midline sternotomy scar Abdominal:     General: Bowel sounds are normal.     Palpations: Abdomen is soft.     Tenderness: There is no abdominal tenderness. There is no rebound.  Musculoskeletal:     Cervical back: Neck supple.     Comments: Trace bilateral lower extremity edema  Skin:    General: Skin is warm and dry.  Neurological:     Mental Status: He is alert and oriented to person, place, and time.     Comments: Cranial nerves II through XII intact, 5 out of 5 strength in all 4 extremities, no dysmetria to finger-nose-finger  Psychiatric:        Mood and Affect: Mood normal.     ED Results / Procedures / Treatments   Labs (all labs ordered are listed, but only abnormal results are displayed) Labs Reviewed  BASIC METABOLIC PANEL - Abnormal; Notable for the following components:      Result Value   CO2 21 (*)    Creatinine, Ser 2.34 (*)    GFR calc non Af Amer 27 (*)    GFR calc Af Amer 31 (*)    All other components within normal limits  CBC - Abnormal; Notable for the following components:   RBC 3.59 (*)    Hemoglobin 10.3 (*)    HCT 32.3 (*)    All other components within normal limits  LACTIC ACID, PLASMA - Abnormal; Notable for the following components:   Lactic Acid, Venous 2.1 (*)    All other components within normal limits  LACTIC ACID, PLASMA - Abnormal; Notable for the following components:   Lactic Acid, Venous 2.0 (*)    All other  components within normal limits  TROPONIN I (HIGH SENSITIVITY) - Abnormal; Notable for the following components:   Troponin I (High Sensitivity) 138 (*)    All other components within normal limits  TROPONIN I (HIGH SENSITIVITY) - Abnormal; Notable for the following components:   Troponin I (High Sensitivity) 142 (*)    All other components within normal limits  SARS CORONAVIRUS 2 (TAT 6-24 HRS)  URINALYSIS, ROUTINE W REFLEX MICROSCOPIC    EKG EKG Interpretation  Date/Time:  Thursday September 14 2019 01:18:13 EDT Ventricular Rate:  74 PR Interval:  174 QRS Duration: 156 QT Interval:  468 QTC Calculation: 519 R Axis:   32 Text Interpretation: Normal sinus rhythm Left bundle branch block Abnormal ECG New LBBB Confirmed by Thayer Jew [phone]) on 09/14/2019 3:12:16 AM   Radiology DG Chest 2 View  Result Date: 09/14/2019 CLINICAL DATA:  Chest pain EXAM: CHEST - 2 VIEW COMPARISON:  May 25, 2018 FINDINGS: The heart size and mediastinal contours are mildly enlarged. Aortic knob calcifications. Overlying median sternotomy wires. No large airspace consolidation or pleural effusion. No acute osseous abnormality. IMPRESSION: No active cardiopulmonary disease. Electronically Signed   By: Prudencio Pair M.D.   On: 09/14/2019 02:19    Procedures Procedures (including critical care time)  Medications Ordered in ED Medications  sodium chloride flush (NS) 0.9 % injection 3 mL (has no administration in time range)  meclizine (ANTIVERT) tablet 25 mg (25 mg Oral Given 09/14/19 0251)  sodium chloride 0.9 % bolus 250 mL (0 mLs Intravenous Stopped 09/14/19 0427)  sodium chloride 0.9 % bolus 250 mL (250 mLs Intravenous New Bag/Given 09/14/19 0358)    ED Course  I have reviewed the triage vital signs and the nursing notes.  Pertinent labs & imaging results that were available  during my care of the patient were reviewed by me and considered in my medical decision making (see chart for details).     MDM Rules/Calculators/A&P                       Patient presents with multiple complaints.  He is nontoxic-appearing and in no acute distress.  He was notably initially hypotensive but was awake and oriented.  He appeared to have good perfusion.  He reports dizziness and chest pain but is not currently having chest pain.  His EKG shows a left bundle branch block.  Based on my chart review, I do not see any history of a bundle branch block although he does have a recent history of cardiac catheterization with 2 drug-eluting stents.  Initial work-up notable for acute kidney injury with a creatinine of 2.3.  Baseline in the mid ones.  Additionally lactate marginally elevated at 2.1.  He is afebrile and denies any infectious symptoms.  Chest x-ray without pneumothorax or pneumonia.  Initial troponin is 138.  Again this is in the setting of no active chest pain and AKI.  Will repeat.  Patient was given 2 250 cc boluses with improvement of his blood pressure into the 110s.  Question dehydration as cause for dizziness and possible low blood pressures.  He has an EF of 30% so cannot be aggressively rehydrated.  Repeat troponin stable at 142.  We will plan for admission to the hospital for gentle rehydration.  He may need cardiology evaluation if troponins trend upwards given what appear to be new EKG findings.  Final Clinical Impression(s) / ED Diagnoses Final diagnoses:  AKI (acute kidney injury) (HCC)  Dehydration  Dizziness  Atypical chest pain    Rx / DC Orders ED Discharge Orders    None       Shon Baton, MD 09/14/19 [phone]

## 2019-09-14 NOTE — H&P (Signed)
History and Physical    Mark Fernandez NLZ:[id number] DOB: [birth year] DOA: 09/14/2019  Referring MD/NP/PA: Lyda Perone, MD PCP: Jarome Matin, MD  Patient coming from: Home via EMS  Chief Complaint: Chest pain  I have personally briefly reviewed patient's old medical records in [facility]   HPI: Mark Fernandez is a 69 y.o. male with medical history significant of HTN, HLD, CAD s/p CABG in 2007 and s/p 2 DES in 11/2018, NSVT, DM type II, systolic CHF last EF 30% presents with complaints of 2 days of chest pain.  Pain is substernal and described as pressure.  Symptoms usually occur with activity and can last an hour and a half.  Associated symptoms include diaphoresis and shortness of breath.  Rest usually helps relieve symptoms.  He notes that he cannot even walk a few feet without becoming short of breath at this time.  Over the last 3 weeks patient reports that he has been feeling generalized weakness that has worsened.  Reports associated symptoms of loss of appetite, dizziness with changes in position, and one episode of loose stools on the 23rd.  He has a prior diagnosis of vertigo. Denies having any significant fever, chills, leg swelling, calf pain nausea, vomiting, or dysuria.  Patient had routine follow-up with his primary care provider on the 23rd as well and reports that she change some of his medications but he is not sure.  Patient had fallen due to his weakness today prior to EMS arrival.  In route with EMS patient was given full dose aspirin.  ED Course: Upon admission into the emergency department patient was noted to be afebrile with blood pressures 85/64-128/74, and all other vital signs maintained.  EKG appeared showed new left bundle branch block.  Previously noted.  Labs significant for hemoglobin 10.3, BUN 23, creatinine 2.34, glucose 72, lactic acid 2.1->2, and troponin 130->142.  Chest x-ray was otherwise noted to be clear.  Patient had been given 25 mg of meclizine,  and 250 mL bolus of normal saline IV fluids.  Review of Systems  Constitutional: Positive for malaise/fatigue. Negative for fever.  HENT: Negative for nosebleeds and sinus pain.   Eyes: Negative for photophobia and pain.  Respiratory: Positive for shortness of breath. Negative for cough.   Cardiovascular: Positive for chest pain. Negative for leg swelling.  Gastrointestinal: Negative for constipation, nausea and vomiting.  Genitourinary: Negative for dysuria and hematuria.  Musculoskeletal: Positive for falls.  Skin: Negative for rash.  Neurological: Positive for dizziness and weakness. Negative for loss of consciousness.  Psychiatric/Behavioral: Negative for memory loss and substance abuse.    Past Medical History:  Diagnosis Date  . Arrhythmia    NSVT  . Bell's palsy   . Cardiomyopathy    EF 45%  . Coronary artery disease    2007 status post CABG X 4  (left internal mammary artery to left anterior descending, saphenous vein graft to diagonal, saphenous  vein graft to obtuse marginal, saphenous vein graft to right coronary   artery).  . Diabetes mellitus    type II  . History of tobacco use   . Hypercholesteremia   . Hypertension   . SVT (supraventricular tachycardia) (HCC)     Past Surgical History:  Procedure Laterality Date  . CARDIAC CATHETERIZATION N/A 12/10/2015   Procedure: Left Heart Cath and Cors/Grafts Angiography;  Surgeon: Lennette Bihari, MD;  Location: [facility] INVASIVE CV LAB;  Service: Cardiovascular;  Laterality: N/A;  . CATARACT EXTRACTION, BILATERAL    .  CHOLECYSTECTOMY    . CORONARY ARTERY BYPASS GRAFT    . LITHOTRIPSY       reports that he quit smoking about 16 years ago. He has never used smokeless tobacco. He reports current alcohol use. He reports that he does not use drugs.  No Known Allergies  Family History  Problem Relation Age of Onset  . Hypertension Mother   . Diabetes Mother     Prior to Admission medications   Medication Sig Start Date  End Date Taking? Authorizing Provider  cetirizine (ZYRTEC) 10 MG tablet Take 10 mg by mouth daily.   Yes [provider]  albuterol (VENTOLIN HFA) 108 (90 Base) MCG/ACT inhaler Inhale 2 puffs into the lungs every 4 (four) hours as needed for wheezing or shortness of breath.  09/12/19   [provider]  aspirin EC 81 MG EC tablet Take 1 tablet (81 mg total) by mouth daily. 12/11/15   Cheryln Manly, NP  atorvastatin (LIPITOR) 80 MG tablet Take 1 tablet (80 mg total) by mouth daily at 6 PM. 04/15/18   Barrett, Evelene Croon, PA-C  carvedilol (COREG) 12.5 MG tablet Take 1 tablet (12.5 mg total) by mouth 2 (two) times daily with a meal. Patient not taking: Reported on 09/14/2019 04/15/18   Barrett, Evelene Croon, PA-C  ENTRESTO 49-51 MG Take 1 tablet by mouth 2 (two) times daily. 09/02/19   [provider]  HUMULIN 70/30 (70-30) 100 UNIT/ML injection Inject 45 Units into the skin 2 (two) times daily.  11/05/15   [provider]  isosorbide mononitrate (IMDUR) 30 MG 24 hr tablet Take 1 tablet (30 mg total) by mouth daily. 04/15/18   Barrett, Evelene Croon, PA-C  meclizine (ANTIVERT) 12.5 MG tablet Take 1 tablet (12.5 mg total) by mouth 2 (two) times daily as needed for dizziness. 05/25/18   Masoudi, Dorthula Rue, MD  metoprolol succinate (TOPROL-XL) 100 MG 24 hr tablet Take 100 mg by mouth daily. 09/02/19   [provider]  nitroGLYCERIN (NITROSTAT) 0.4 MG SL tablet Place 1 tablet (0.4 mg total) under the tongue every 5 (five) minutes x 3 doses as needed for chest pain. 04/15/18   Barrett, Evelene Croon, PA-C  pantoprazole (PROTONIX) 40 MG tablet Take 1 tablet (40 mg total) by mouth daily. 04/15/18   Barrett, Evelene Croon, PA-C  spironolactone (ALDACTONE) 25 MG tablet Take 25 mg by mouth daily. 07/27/19   [provider]  tamsulosin (FLOMAX) 0.4 MG CAPS capsule Take 1 capsule (0.4 mg total) by mouth daily after supper. 05/18/18   Thurnell Lose, MD  TRELEGY ELLIPTA 100-62.5-25  MCG/INH AEPB Inhale 1 puff into the lungs daily. 09/12/19   [provider]  TRULICITY 7.93 JQ/3.0SP SOPN Inject 0.75 mg into the skin every Friday.  09/12/19   [provider]    Physical Exam:  Constitutional: Elderly male who appears to be in no acute distress Vitals:   09/14/19 0415 09/14/19 0445 09/14/19 0615 09/14/19 0645  BP: 107/65 109/73 128/74 106/67  Pulse: 78 74 74 72  Resp: 17 19 16 18   Temp:      TempSrc:      SpO2: 100% 99% 100% 100%  Weight:      Height:       Eyes: PERRL, lids and conjunctivae normal ENMT: Mucous membranes are dry. Posterior pharynx clear of any exudate or lesions.  Neck: normal, supple, no masses, no thyromegaly Respiratory: clear to auscultation bilaterally, no wheezing, no crackles. Normal respiratory effort. No accessory  muscle use.  Cardiovascular: Regular rate and rhythm, positive 2/6 systolic ejection murmur no extremity edema. 2+ pedal pulses. No carotid bruits.  Healed median sternotomy scar. Abdomen: Mild tenderness to palpation of the left lower quadrant, no masses palpated. No hepatosplenomegaly. Bowel sounds positive.  Musculoskeletal: no clubbing / cyanosis. No joint deformity upper and lower extremities. Good ROM, no contractures. Normal muscle tone.  Skin: no rashes, lesions, ulcers. No induration Neurologic: CN 2-12 grossly intact. Sensation intact, DTR normal. Strength 5/5 in all 4.  Psychiatric: Normal judgment and insight. Alert and oriented x 3. Normal mood.     Labs on Admission: I have personally reviewed following labs and imaging studies  CBC: Recent Labs  Lab 09/14/19 0129  WBC 6.3  HGB 10.3*  HCT 32.3*  MCV 90.0  PLT 356   Basic Metabolic Panel: Recent Labs  Lab 09/14/19 0129  NA 143  K 3.7  CL 109  CO2 21*  GLUCOSE 72  BUN 23  CREATININE 2.34*  CALCIUM 9.3   GFR: Estimated Creatinine Clearance: 29.5 mL/min (A) (by C-G formula based on SCr of 2.34 mg/dL (H)). Liver Function  Tests: No results for input(s): AST, ALT, ALKPHOS, BILITOT, PROT, ALBUMIN in the last 168 hours. No results for input(s): LIPASE, AMYLASE in the last 168 hours. No results for input(s): AMMONIA in the last 168 hours. Coagulation Profile: No results for input(s): INR, PROTIME in the last 168 hours. Cardiac Enzymes: No results for input(s): CKTOTAL, CKMB, CKMBINDEX, TROPONINI in the last 168 hours. BNP (last 3 results) No results for input(s): PROBNP in the last 8760 hours. HbA1C: No results for input(s): HGBA1C in the last 72 hours. CBG: No results for input(s): GLUCAP in the last 168 hours. Lipid Profile: No results for input(s): CHOL, HDL, LDLCALC, TRIG, CHOLHDL, LDLDIRECT in the last 72 hours. Thyroid Function Tests: No results for input(s): TSH, T4TOTAL, FREET4, T3FREE, THYROIDAB in the last 72 hours. Anemia Panel: No results for input(s): VITAMINB12, FOLATE, FERRITIN, TIBC, IRON, RETICCTPCT in the last 72 hours. Urine analysis:    Component Value Date/Time   COLORURINE YELLOW 05/18/2018 0900   APPEARANCEUR CLEAR 05/18/2018 0900   LABSPEC 1.006 05/18/2018 0900   PHURINE 6.0 05/18/2018 0900   GLUCOSEU NEGATIVE 05/18/2018 0900   HGBUR NEGATIVE 05/18/2018 0900   BILIRUBINUR NEGATIVE 05/18/2018 0900   KETONESUR NEGATIVE 05/18/2018 0900   PROTEINUR NEGATIVE 05/18/2018 0900   NITRITE NEGATIVE 05/18/2018 0900   LEUKOCYTESUR NEGATIVE 05/18/2018 0900   Sepsis Labs: No results found for this or any previous visit (from the past 240 hour(s)).   Radiological Exams on Admission: DG Chest 2 View  Result Date: 09/14/2019 CLINICAL DATA:  Chest pain EXAM: CHEST - 2 VIEW COMPARISON:  May 25, 2018 FINDINGS: The heart size and mediastinal contours are mildly enlarged. Aortic knob calcifications. Overlying median sternotomy wires. No large airspace consolidation or pleural effusion. No acute osseous abnormality. IMPRESSION: No active cardiopulmonary disease. Electronically Signed   By:  Jonna Clark M.D.   On: 09/14/2019 02:19    EKG: Independently reviewed.  Sinus rhythm at 74 bpm with QTc 519  Assessment/Plan Chest pain, elevated troponin: Acute.  Patient presents with reports of 2 days of substernal chest pressure with exertion.  Troponin 138-> 142.  Patient with prior history of tobacco use. -Admit to a cardiac telemetry bed -Check lipid panel -Check echocardiogram -Cardiology consulted, we will follow-up for further recommendation  CAD: Patient status post CABG in 2007 and placement of 2 drug-eluting stents in 11/2018  at [facility]  Patient had been on Effient -Continue aspirin, Effient, and statin  Acute kidney injury superimposed on chronic kidney disease stage III a secondary to dehydration: Patient presents with creatinine elevated up to 2.34 with BUN 23.  Review of records notes previous baseline around 1.3 in 11/2018. -Check urinalysis -Check FeNa and FeUr -Gentle IV fluids at 75 mL/h -Hold nephrotoxic agents -Recheck BMP in a.m.  Lactic acidosis: Acute.  Lactic acid initially 2.1.  Suspect this could be due to dehydration   -Continue to trend lactic acid level.  Prolonged QT interval: Acute.  QTc 517 on admission. -Correct any electrolyte abnormalities -Check EKG in a.m.  Essential hypertension: Blood pressures on admission were noted to be soft.  Home blood pressure medications include metoprolol 50 mg daily, furosemide 40 mg daily, lisinopril 20 mg daily, and Entresto 49-51mg  twice daily, -Held nephrotoxic agents -Continue metoprolol as tolerated  Diabetes mellitus type II: Previously noted to be uncontrolled as last hemoglobin A1c on file was 12.3 in 04/2018.  On admission glucose at the lower limit of normal at 72, but patient was NPO.  Home medications include Lantus 55 units nightly, and Victoza 1.2 mg daily. -Hypoglycemic protocols -Hold Victoza -Reduced from Lantus to 25 -CBGs before every meal with moderate SSI  Generalized weakness - Will need  PT/OT evaluation after acute issues are better controlled  Systolic CHF: Patient appears not to show any significant signs of fluid overload at this time.  Last EF noted to be around 30% in 11/2018. -Strict intake and output -Daily weights  Hyperlipidemia -Continue atorvastatin  BPH -Continue Flomax   Normocytic anemia: Hemoglobin 10.3 appears stable compared to previous values seen on care everywhere. -Continue to monitor  GERD: -Hold Protonix for prolonged QT interval   Obesity: BMI 33.47 kg/m  DVT prophylaxis: Heparin Code Status: Full Family Communication: Patient declined need to update family at this time Disposition Plan: Possible discharge home in 1 to 2 days Consults called: Cardiology Admission status: Observation  Clydie Braun MD [facility] Pager [phone]   If 7PM-7AM, please contact night-coverage [email or web address] Password [facility]  09/14/2019, 8:07 AM

## 2019-09-14 NOTE — Progress Notes (Signed)
  Echocardiogram 2D Echocardiogram has been performed.  Mark Fernandez 09/14/2019, 11:55 AM

## 2019-09-14 NOTE — ED Notes (Signed)
Got patient on the monitor patient is resting with call bell in reach  ?

## 2019-09-15 DIAGNOSIS — D631 Anemia in chronic kidney disease: Secondary | ICD-10-CM | POA: Diagnosis present

## 2019-09-15 DIAGNOSIS — N1832 Chronic kidney disease, stage 3b: Secondary | ICD-10-CM | POA: Diagnosis present

## 2019-09-15 DIAGNOSIS — K219 Gastro-esophageal reflux disease without esophagitis: Secondary | ICD-10-CM | POA: Diagnosis present

## 2019-09-15 DIAGNOSIS — N400 Enlarged prostate without lower urinary tract symptoms: Secondary | ICD-10-CM | POA: Diagnosis present

## 2019-09-15 DIAGNOSIS — I252 Old myocardial infarction: Secondary | ICD-10-CM | POA: Diagnosis not present

## 2019-09-15 DIAGNOSIS — I25810 Atherosclerosis of coronary artery bypass graft(s) without angina pectoris: Secondary | ICD-10-CM | POA: Diagnosis present

## 2019-09-15 DIAGNOSIS — E870 Hyperosmolality and hypernatremia: Secondary | ICD-10-CM | POA: Diagnosis present

## 2019-09-15 DIAGNOSIS — I959 Hypotension, unspecified: Secondary | ICD-10-CM | POA: Diagnosis present

## 2019-09-15 DIAGNOSIS — E872 Acidosis: Secondary | ICD-10-CM | POA: Diagnosis present

## 2019-09-15 DIAGNOSIS — I5043 Acute on chronic combined systolic (congestive) and diastolic (congestive) heart failure: Secondary | ICD-10-CM | POA: Diagnosis not present

## 2019-09-15 DIAGNOSIS — I340 Nonrheumatic mitral (valve) insufficiency: Secondary | ICD-10-CM

## 2019-09-15 DIAGNOSIS — R079 Chest pain, unspecified: Secondary | ICD-10-CM | POA: Diagnosis not present

## 2019-09-15 DIAGNOSIS — Z951 Presence of aortocoronary bypass graft: Secondary | ICD-10-CM | POA: Diagnosis not present

## 2019-09-15 DIAGNOSIS — E1122 Type 2 diabetes mellitus with diabetic chronic kidney disease: Secondary | ICD-10-CM | POA: Diagnosis present

## 2019-09-15 DIAGNOSIS — I25708 Atherosclerosis of coronary artery bypass graft(s), unspecified, with other forms of angina pectoris: Secondary | ICD-10-CM | POA: Diagnosis not present

## 2019-09-15 DIAGNOSIS — R0600 Dyspnea, unspecified: Secondary | ICD-10-CM | POA: Diagnosis present

## 2019-09-15 DIAGNOSIS — I447 Left bundle-branch block, unspecified: Secondary | ICD-10-CM | POA: Diagnosis present

## 2019-09-15 DIAGNOSIS — R0609 Other forms of dyspnea: Secondary | ICD-10-CM | POA: Diagnosis present

## 2019-09-15 DIAGNOSIS — Z20822 Contact with and (suspected) exposure to covid-19: Secondary | ICD-10-CM | POA: Diagnosis present

## 2019-09-15 DIAGNOSIS — E1169 Type 2 diabetes mellitus with other specified complication: Secondary | ICD-10-CM | POA: Diagnosis not present

## 2019-09-15 DIAGNOSIS — Z955 Presence of coronary angioplasty implant and graft: Secondary | ICD-10-CM | POA: Diagnosis not present

## 2019-09-15 DIAGNOSIS — I5023 Acute on chronic systolic (congestive) heart failure: Secondary | ICD-10-CM | POA: Diagnosis present

## 2019-09-15 DIAGNOSIS — E860 Dehydration: Secondary | ICD-10-CM | POA: Diagnosis present

## 2019-09-15 DIAGNOSIS — E785 Hyperlipidemia, unspecified: Secondary | ICD-10-CM | POA: Diagnosis present

## 2019-09-15 DIAGNOSIS — I2510 Atherosclerotic heart disease of native coronary artery without angina pectoris: Secondary | ICD-10-CM | POA: Diagnosis present

## 2019-09-15 DIAGNOSIS — I5021 Acute systolic (congestive) heart failure: Secondary | ICD-10-CM | POA: Diagnosis not present

## 2019-09-15 DIAGNOSIS — I130 Hypertensive heart and chronic kidney disease with heart failure and stage 1 through stage 4 chronic kidney disease, or unspecified chronic kidney disease: Secondary | ICD-10-CM | POA: Diagnosis present

## 2019-09-15 DIAGNOSIS — E669 Obesity, unspecified: Secondary | ICD-10-CM | POA: Diagnosis not present

## 2019-09-15 DIAGNOSIS — N179 Acute kidney failure, unspecified: Secondary | ICD-10-CM | POA: Diagnosis present

## 2019-09-15 DIAGNOSIS — I4581 Long QT syndrome: Secondary | ICD-10-CM | POA: Diagnosis present

## 2019-09-15 DIAGNOSIS — I255 Ischemic cardiomyopathy: Secondary | ICD-10-CM | POA: Diagnosis present

## 2019-09-15 LAB — CBC
HCT: 30.8 % — ABNORMAL LOW (ref 39.0–52.0)
Hemoglobin: 9.9 g/dL — ABNORMAL LOW (ref 13.0–17.0)
MCH: 28.6 pg (ref 26.0–34.0)
MCHC: 32.1 g/dL (ref 30.0–36.0)
MCV: 89 fL (ref 80.0–100.0)
Platelets: 335 10*3/uL (ref 150–400)
RBC: 3.46 MIL/uL — ABNORMAL LOW (ref 4.22–5.81)
RDW: 15.7 % — ABNORMAL HIGH (ref 11.5–15.5)
WBC: 6.1 10*3/uL (ref 4.0–10.5)
nRBC: 0 % (ref 0.0–0.2)

## 2019-09-15 LAB — BASIC METABOLIC PANEL
Anion gap: 10 (ref 5–15)
BUN: 28 mg/dL — ABNORMAL HIGH (ref 8–23)
CO2: 23 mmol/L (ref 22–32)
Calcium: 9 mg/dL (ref 8.9–10.3)
Chloride: 113 mmol/L — ABNORMAL HIGH (ref 98–111)
Creatinine, Ser: 2.05 mg/dL — ABNORMAL HIGH (ref 0.61–1.24)
GFR calc Af Amer: 37 mL/min — ABNORMAL LOW (ref >60)
GFR calc non Af Amer: 32 mL/min — ABNORMAL LOW (ref >60)
Glucose, Bld: 70 mg/dL (ref 70–99)
Potassium: 3.9 mmol/L (ref 3.5–5.1)
Sodium: 146 mmol/L — ABNORMAL HIGH (ref 135–145)

## 2019-09-15 LAB — GLUCOSE, CAPILLARY
Glucose-Capillary: 66 mg/dL — ABNORMAL LOW (ref 70–99)
Glucose-Capillary: 109 mg/dL — ABNORMAL HIGH (ref 70–99)
Glucose-Capillary: 106 mg/dL — ABNORMAL HIGH (ref 70–99)
Glucose-Capillary: 169 mg/dL — ABNORMAL HIGH (ref 70–99)

## 2019-09-15 LAB — UREA NITROGEN, URINE: Urea Nitrogen, Ur: 618 mg/dL

## 2019-09-15 MED ORDER — SODIUM CHLORIDE 0.9 % IV SOLN: INTRAVENOUS | Status: DC

## 2019-09-15 MED ORDER — INSULIN GLARGINE 100 UNIT/ML ~~LOC~~ SOLN
18.0000 [IU] | Freq: Every day | SUBCUTANEOUS | Status: DC
  Administered 2019-09-15 – 2019-09-21 (×7): 18 [IU] via SUBCUTANEOUS
  Filled 2019-09-15 (×8): qty 0.18

## 2019-09-15 NOTE — Progress Notes (Addendum)
Inpatient Diabetes Program Recommendations  AACE/ADA: New Consensus Statement on Inpatient Glycemic Control (2015)  Target Ranges:  Prepandial:   less than 140 mg/dL      Peak postprandial:   less than 180 mg/dL (1-2 hours)      Critically ill patients:  140 - 180 mg/dL   Results for ROXY, FILLER (MRN [id number]) as of 09/15/2019 10:34  Ref. Range 09/14/2019 12:06 09/14/2019 16:33 09/14/2019 22:03 09/15/2019 06:08  Glucose-Capillary Latest Ref Range: 70 - 99 mg/dL 85 119 (H) 147 (H)  25 units LANTUS 66 (L)    Results for ESTES, LEHNER (MRN [id number]) as of 09/15/2019 10:34  Ref. Range 05/15/2018 19:31 09/14/2019 10:18  Hemoglobin A1C Latest Ref Range: 4.8 - 5.6 % 12.3 (H) 12.9 (H)  (323 mg/dl)    Admit with: CP/ Elevated Troponin/ Acute Kidney Injury  History: DM, CHF, CKD  Home DM Meds: Lantus 55 units QHS       Trulcity Daily  Current Orders: Lantus 18 units QHS      Novolog Moderate Correction Scale/ SSI (0-15 units) TID AC  PCP: Dr. Jarome Matin with [facility]     Note Hypoglycemic event this AM--Agree with reduction of Lantus for tonight.  Note current A1c of 12.9%--relatively unchanged from 2019--was counseled back in 2019 regarding importance of good glucose control--Plan to speak w/ pt today to discuss current A1c results again.    Addendum 1pm--Spoke w/ pt today at bedside about his current A1c and home diabetes management.  Pt told me he checks his CBGs BID at home (before breakfast and before dinner meals).  Stated CBGs usually run in the 140s to 180s.  Was surprised to hear his A1c was so high.  Told me his last A1c checked a few days prior to admission at his PCP's office (Dr. Winona Legato with [facility]) was 11%.  Admits to missing at least 3 doses of Lantus per week.  Stated he is good about taking his Trulicity once per week.  Also told me he has not been eating much at home.  Does drink fruit juice and regular  ginger ale.  Spoke with patient about his current A1c of 12.9%.  Explained what an A1c is and what it measures.  Reminded patient that his goal A1c is 7% or less per ADA standards to prevent both acute and long-term complications.  Explained to patient the extreme importance of good glucose control at home.  Encouraged patient to continue to check his CBGs at least bid at home and when feeling poorly and to record all CBGs in a logbook for his PCP to review.  Strongly encouraged pt to avoid beverages with carbohydrates and sugar (can consume small amounts when unable to eat solid foods) and strongly encouraged pt to be more consistent with taking his Lantus daily.      --Will follow patient during hospitalization--  Ambrose Finland RN, MSN, CDE Diabetes Coordinator Inpatient Glycemic Control Team Team Pager: [phone] (8a-5p)

## 2019-09-15 NOTE — Progress Notes (Signed)
CBG 66, orange juice given, pt denies s/s of hypoglycemia, will cont to monitor.

## 2019-09-15 NOTE — Progress Notes (Addendum)
Progress Note  Patient Name: Evrett C RoysterLucius Conn Date of Encounter: 09/15/2019  Primary Cardiologist: Rollene RotundaJames Lavoy Bernards, MD   Subjective   Patient denies recurrent chest pain. Still feels SOB. Also feels tired and weak. Pressures soft overnight.   Inpatient Medications    Scheduled Meds: . aspirin EC  81 mg Oral Daily  . atorvastatin  80 mg Oral q1800  . fluticasone furoate-vilanterol  1 puff Inhalation Daily   And  . umeclidinium bromide  1 puff Inhalation Daily  . heparin  5,000 Units Subcutaneous Q8H  . insulin aspart  0-15 Units Subcutaneous TID WC  . insulin glargine  18 Units Subcutaneous QHS  . loratadine  10 mg Oral Daily  . metoprolol succinate  25 mg Oral Daily  . prasugrel  10 mg Oral Daily  . tamsulosin  0.4 mg Oral QPC supper   Continuous Infusions: . sodium chloride 50 mL/hr at 09/15/19 0615   PRN Meds: acetaminophen, albuterol, alum & mag hydroxide-simeth, ondansetron (ZOFRAN) IV   Vital Signs    Vitals:   09/15/19 0115 09/15/19 0429 09/15/19 0804 09/15/19 0852  BP: 95/73 (!) 90/59 (!) 92/58   Pulse: 77 73 74   Resp: 20 18    Temp: 97.8 F (36.6 C) (!) 97.5 F (36.4 C)    TempSrc: Oral Oral    SpO2: 95% 99%  99%  Weight:  88.5 kg    Height:        Intake/Output Summary (Last 24 hours) at 09/15/2019 0928 Last data filed at 09/15/2019 0843 Gross per 24 hour  Intake 1222.81 ml  Output 350 ml  Net 872.81 ml   Last 3 Weights 09/15/2019 09/14/2019 05/25/2018  Weight (lbs) 195 lb 1.6 oz 195 lb 192 lb 14.4 oz  Weight (kg) 88.497 kg 88.451 kg 87.5 kg      Telemetry    NSR, HR 70s, PACs and PVCs- Personally Reviewed  ECG    NA- Personally Reviewed  Physical Exam   GEN: No acute distress.   Neck: No JVD Cardiac: RRR, + murmurs, rubs, or gallops.  Respiratory: Clear to auscultation bilaterally. GI: Soft, nontender, non-distended  MS: No edema; No deformity. Neuro:  Nonfocal  Psych: Normal affect   Labs    High Sensitivity Troponin:   Recent  Labs  Lab 09/14/19 0129 09/14/19 0415  TROPONINIHS 138* 142*      Chemistry Recent Labs  Lab 09/14/19 0129 09/15/19 0455  NA 143 146*  K 3.7 3.9  CL 109 113*  CO2 21* 23  GLUCOSE 72 70  BUN 23 28*  CREATININE 2.34* 2.05*  CALCIUM 9.3 9.0  GFRNONAA 27* 32*  GFRAA 31* 37*  ANIONGAP 13 10     Hematology Recent Labs  Lab 09/14/19 0129 09/15/19 0455  WBC 6.3 6.1  RBC 3.59* 3.46*  HGB 10.3* 9.9*  HCT 32.3* 30.8*  MCV 90.0 89.0  MCH 28.7 28.6  MCHC 31.9 32.1  RDW 15.1 15.7*  PLT 356 335    BNPNo results for input(s): BNP, PROBNP in the last 168 hours.   DDimer No results for input(s): DDIMER in the last 168 hours.   Radiology    DG Chest 2 View  Result Date: 09/14/2019 CLINICAL DATA:  Chest pain EXAM: CHEST - 2 VIEW COMPARISON:  May 25, 2018 FINDINGS: The heart size and mediastinal contours are mildly enlarged. Aortic knob calcifications. Overlying median sternotomy wires. No large airspace consolidation or pleural effusion. No acute osseous abnormality. IMPRESSION: No active cardiopulmonary disease.  Electronically Signed   By: Prudencio Pair M.D.   On: 09/14/2019 02:19   ECHOCARDIOGRAM COMPLETE  Result Date: 09/14/2019    ECHOCARDIOGRAM REPORT   Patient Name:   JUSTUN ANAYA Date of Exam: 09/14/2019 Medical Rec #:  [id number]       Height:       64.0 in Accession #:    [id number]      Weight:       195.0 lb Date of Birth:  1951/01/08       BSA:          1.935 m Patient Age:    45 years        BP:           107/88 mmHg Patient Gender: M               HR:           73 bpm. Exam Location:  Inpatient Procedure: 2D Echo, Cardiac Doppler and Color Doppler Indications:    Chest pain  History:        Patient has prior history of Echocardiogram examinations, most                 recent 12/10/2015. Cardiomyopathy, CAD and Previous Myocardial                 Infarction, Prior CABG, Arrythmias:NSVT; Risk Factors:Diabetes,                 Dyslipidemia, Hypertension and Current  Smoker.  Sonographer:    Dustin Flock Referring Phys: [phone] [place]  1. Global hypokinesis; septal dyssynergy; overall moderate to severe LV dysfunction; grade 2 diastolic dysfunction; severe MR; mild LAE.  2. Left ventricular ejection fraction, by estimation, is 30 to 35%. The left ventricle has moderate to severely decreased function. The left ventricle demonstrates global hypokinesis. The left ventricular internal cavity size was mildly dilated. Left ventricular diastolic parameters are consistent with Grade II diastolic dysfunction (pseudonormalization). Elevated left atrial pressure.  3. Right ventricular systolic function is normal. The right ventricular size is normal. There is moderately elevated pulmonary artery systolic pressure.  4. Left atrial size was mildly dilated.  5. The mitral valve is normal in structure. Severe mitral valve regurgitation. No evidence of mitral stenosis.  6. The aortic valve is tricuspid. Aortic valve regurgitation is not visualized. Mild aortic valve sclerosis is present, with no evidence of aortic valve stenosis.  7. The inferior vena cava is normal in size with greater than 50% respiratory variability, suggesting right atrial pressure of 3 mmHg. FINDINGS  Left Ventricle: Left ventricular ejection fraction, by estimation, is 30 to 35%. The left ventricle has moderate to severely decreased function. The left ventricle demonstrates global hypokinesis. The left ventricular internal cavity size was mildly dilated. There is no left ventricular hypertrophy. Left ventricular diastolic parameters are consistent with Grade II diastolic dysfunction (pseudonormalization). Elevated left atrial pressure. Right Ventricle: The right ventricular size is normal. Right ventricular systolic function is normal. There is moderately elevated pulmonary artery systolic pressure. The tricuspid regurgitant velocity is 2.97 m/s, and with an assumed right atrial pressure of 8  mmHg, the estimated right ventricular systolic pressure is 67.8 mmHg. Left Atrium: Left atrial size was mildly dilated. Right Atrium: Right atrial size was normal in size. Pericardium: There is no evidence of pericardial effusion. Mitral Valve: The mitral valve is normal in structure. Normal mobility of the mitral valve leaflets. Mild mitral annular calcification. Severe  mitral valve regurgitation. No evidence of mitral valve stenosis. Tricuspid Valve: The tricuspid valve is normal in structure. Tricuspid valve regurgitation is mild . No evidence of tricuspid stenosis. Aortic Valve: The aortic valve is tricuspid. Aortic valve regurgitation is not visualized. Mild aortic valve sclerosis is present, with no evidence of aortic valve stenosis. Pulmonic Valve: The pulmonic valve was normal in structure. Pulmonic valve regurgitation is trivial. No evidence of pulmonic stenosis. Aorta: The aortic root is normal in size and structure. Venous: The inferior vena cava is normal in size with greater than 50% respiratory variability, suggesting right atrial pressure of 3 mmHg. IAS/Shunts: No atrial level shunt detected by color flow Doppler. Additional Comments: Global hypokinesis; septal dyssynergy; overall moderate to severe LV dysfunction; grade 2 diastolic dysfunction; severe MR; mild LAE.  LEFT VENTRICLE PLAX 2D LVIDd:         5.42 cm  Diastology LVIDs:         4.93 cm  LV e' lateral:   7.08 cm/s LV PW:         1.27 cm  LV E/e' lateral: 15.4 LV IVS:        1.29 cm  LV e' medial:    3.16 cm/s LVOT diam:     2.00 cm  LV E/e' medial:  34.5 LV SV:         45 LV SV Index:   23 LVOT Area:     3.14 cm  RIGHT VENTRICLE RV Basal diam:  2.54 cm RV S prime:     4.74 cm/s TAPSE (M-mode): 2.1 cm LEFT ATRIUM           Index       RIGHT ATRIUM           Index LA diam:      4.30 cm 2.22 cm/m  RA Area:     13.40 cm LA Vol (A4C): 68.9 ml 35.60 ml/m RA Volume:   30.90 ml  15.97 ml/m  AORTIC VALVE LVOT Vmax:   74.20 cm/s LVOT Vmean:   49.000 cm/s LVOT VTI:    0.142 m  AORTA Ao Root diam: 2.70 cm MITRAL VALVE                TRICUSPID VALVE MV Area (PHT): 3.31 cm     TR Peak grad:   35.3 mmHg MV Decel Time: 229 msec     TR Vmax:        297.00 cm/s MV E velocity: 109.00 cm/s MV A velocity: 57.40 cm/s   SHUNTS MV E/A ratio:  1.90         Systemic VTI:  0.14 m                             Systemic Diam: 2.00 cm Olga Millers MD Electronically signed by Olga Millers MD Signature Date/Time: 09/14/2019/1:08:11 PM    Final     Cardiac Studies   Echo 09/14/19 1. Global hypokinesis; septal dyssynergy; overall moderate to severe LV  dysfunction; grade 2 diastolic dysfunction; severe MR; mild LAE.  2. Left ventricular ejection fraction, by estimation, is 30 to 35%. The  left ventricle has moderate to severely decreased function. The left  ventricle demonstrates global hypokinesis. The left ventricular internal  cavity size was mildly dilated. Left  ventricular diastolic parameters are consistent with Grade II diastolic  dysfunction (pseudonormalization). Elevated left atrial pressure.  3. Right ventricular systolic function is normal. The right  ventricular  size is normal. There is moderately elevated pulmonary artery systolic  pressure.  4. Left atrial size was mildly dilated.  5. The mitral valve is normal in structure. Severe mitral valve  regurgitation. No evidence of mitral stenosis.  6. The aortic valve is tricuspid. Aortic valve regurgitation is not  visualized. Mild aortic valve sclerosis is present, with no evidence of  aortic valve stenosis.  7. The inferior vena cava is normal in size with greater than 50%  respiratory variability, suggesting right atrial pressure of 3 mmHg.  Left heart cath 12/10/15:  Ost LAD to Prox LAD lesion, 70% stenosed.  Mid RCA lesion, 100% stenosed.  2nd Mrg lesion, 99% stenosed.  Lat 1st Mrg lesion, 99% stenosed.  1st Mrg lesion, 80% stenosed.  LIMA .  Dist LAD lesion, 80%  stenosed.  Mid Cx lesion, 80% stenosed.  Prox RCA lesion, 30% stenosed.  SVG .  Prox Graft lesion, 40% stenosed.  Ost RPDA to RPDA lesion, 95% stenosed.  SVG .  Origin lesion, 100% stenosed.  SVG .  The LIMA graft was widely patent and anastomosed into the mid LAD. The LAD beyond the anastomosis was small caliber. There was diffuse narrowing of 80% in the mid distal LAD segment.  The SVG to the distal RCA was a large graft. There was 40% smooth eccentric proximal stenosis focally. The graft anastomosing to the distal RCA. The first PDA branch was diffusely narrowed 99%. The distal RCA and is in a small posterolateral branch.  The SVG to the diagonal vessel was totally occluded at its origin.  The SVG supplying the marginal vessel was widely patent. There was narrowing in a branch of the marginal after the anastomosis of about 70%.   Severe multi-vessel native LAD disease with diffuse 70% proximal LAD stenosis; 90 and 99% stenoses in the OM1 and OM 2 branches of the circumflex coronary artery with 80% mid AV groove circumflex stenosis after the takeoff of the second marginal branch; 30% proximal in total occlusion of the mid RCA after an RV marginal branch.  Patent LIMA graft supplying the mid LAD but with diffusely diseased small caliber LAD with narrowing of 80% in the mid distal segment.  Patent SVG to circumflex marginal branch.  Occluded SVG which previously supplied the diagonal vessel.  Pain SVG to the distal RCA, but with 40% smooth eccentric focal proximal stenosis in the proximal segment of the graft and severe diffuse subtotal PDA stenosis in the native RCA beyond the anastomosis.  LVEDP 20 mmHg.  RECOMMENDATION: Increased medical therapy. The patient is already on amlodipine as well as carvedilol. With diffuse native CAD consider the addition of Ranexa and oral nitrates. A 2-D echo Doppler study will be ordered to assess LV function. The patient will be  hydrated post procedure with his underlying renal insufficiency.   Left heart cath [facility] 11/30/18: Severe 90% mid Lcx and 95% distal Lcx stenosis-culprit vessel s/p DES x 2: Mid: 2.5 x 12 DES Resolute Onyx, Distal: 2.0 x 8 mm DES Resolute Onyx Patent LIMA-LAD Patent SVG-Om1 Patent SVG-RCA Total occlusion of native mid LAD and mid RCA  Recommendations:  Continue DAPT with aspirin and ticagrelor Continue CAD medical therapy  Patient Profile     69 y.o. male with hx of CAD s/p CABG (LIMA-LAD, SVG-D1, SVG-OM, SVG-RCA in 2007), EF 45%, SVT, DM, HTN, HLD, former smoker, intolerant to statins who is being evaluated for chest pain.   Assessment & Plan    Chest pain/CAD  s/p DES x2 to Cx 11/2018 - Hs troponin 138>142 - EKG with new LBBB - Patient is on ASA and Effient - CP in the setting of hypotension, AKI, and lactic acidosis (now improved). Chest pain was different from previous cardiac pain. Denies recurrence. - Previous EF was 30% 11/2018. Echo this admission is 30-35%, global hypokinesis, G2DD, septal dyssynergy - creatinine on admission was 2.34. today is 2.05. Would still hold on cath at this time.  - Imdur held on admission for hypotension. BB continued  Dizziness/hypotension - new LBBB on EKG - ACE, entresto, lasix, and spiro held - Metoprolol started on admission (in place of coreg)>>held as needed for hypotension - BP this AM 92/58  AKI - 2.53 on admission. Given IVF - 2.05 today - ACE, Entresto, Lasix, Cleda Daub held - continue to monitor  Chronic systolic HF - EF was 30% 11/2018 at [facility] - euvolemic on exam - echo showed EF 30-35%, global hypokinesis, G2DD, septal dyssynergy, moderately elevated pulmonary artery systolic pressure, severe MR - continue BB as tolerated  Severe MR - per echo this admission - was trivial in 2017 echo - possibly the reason for SOB - cannot find record of MR in [facility] documents  Anemia - Hgb baseline around 10 - FOBT  DM2 -  uncontrolled, A1C 12.9 - SSI  HLD - continue statin - LDL 68   For questions or updates, please contact [facility] Please consult [email or web address] for contact info under       Signed, Cadence David Stall, PA-C  09/15/2019, 9:28 AM    History and all data above reviewed.  Patient examined.  I agree with the findings as above.  The patient has not had chest pain.  However, he is still SOB with ambulation in his room.  Echo results as above.  I looked at the written report from [facility] in 2020 and they report trivial MR so it looks like this is new.   The patient exam reveals COR:RRR, distant heart sounds.  I don't appreciate a murur  ,  Lungs: clear  ,  Abd: Positive bowel sounds, no rebound no guarding, Ext no edema   .  All available labs, radiology testing, previous records reviewed. Agree with documented assessment and plan. Mitral regurgitation.  Worrisome new MR with acute symptoms prior to presentation.  New LBBB.  Trop mildly elevated.  I think he needs to stay in hospital for a TEE on Monday.  This will also allow [place] to follow his creat and see if he can have a cath with lower risk early next week.  I would start with the TEE and later make decisions about cath.    Nicodemus Rendy Lazard  2:01 PM  09/15/2019

## 2019-09-15 NOTE — Progress Notes (Signed)
Call from tele with report that pt has ST elevation of 2.3. pt denies c/o CP. Reports he "just has a weird feeling in his throat." M. Denny-triad paged and made aware. Will cont to monitor.

## 2019-09-15 NOTE — Progress Notes (Signed)
Progress Note    Mark Fernandez  ZTI:[id number] DOB: 1951-04-30  DOA: 09/14/2019 PCP: Jarome Matin, MD    Brief Narrative:     Medical records reviewed and are as summarized below:  Mark Fernandez is an 69 y.o. male with medical history significant of HTN, HLD, CAD s/p CABG in 2007 and s/p 2 DES in 11/2018, NSVT, DM type II, systolic CHF last EF 30% presents with complaints of 2 days of chest pain.  Pain is substernal and described as pressure.  Symptoms usually occur with activity and can last an hour and a half.  Associated symptoms include diaphoresis and shortness of breath.  Rest usually helps relieve symptoms.  He notes that he cannot even walk a few feet without becoming short of breath at this time.  Over the last 3 weeks patient reports that he has been feeling generalized weakness that has worsened.    Assessment/Plan:   Principal Problem:   Chest pain Active Problems:   Coronary artery disease   Diabetes (HCC)   Acute kidney injury superimposed on chronic kidney disease (HCC)   Obesity (BMI 30.0-34.9)   Lactic acidosis   Prolonged QT interval   Normocytic anemia   Dyspnea on minimal exertion   Dyspnea on exertion with chest pain, elevated troponin: Acute.   -Patient was found to have a new worrisome mitral regurgitation with symptoms on echo along with a new left bundle branch block.  Patient has been seen by cardiology and appreciate their recommendations.  They have recommended patient to stay in the hospital until Monday for TEE.  Patient will also need to have close monitoring of his creatinine and consideration of cath can be given if creatinine improved  -CAD: Patient status post CABG in 2007 and placement of 2 drug-eluting stents in 11/2018 at [facility].  Patient had been on Effient -Continue aspirin, Effient, and statin  Acute kidney injury superimposed on chronic kidney disease stage III a secondary to dehydration: Patient presents with creatinine elevated  up to 2.34 with BUN 23.  Review of records notes previous baseline around 1.3 in 11/2018. -Holding nephrotoxic agents -Trending down  Prolonged QT interval: Acute.   -QTC continues to be prolonged -Monitor on telemetryEssential hypertension: Blood pressures on admission were noted to be soft.  Home blood pressure medications include metoprolol 50 mg daily, furosemide 40 mg daily, lisinopril 20 mg daily, and Entresto 49-51mg  twice daily, -Holding parameters on blood pressure agents  Diabetes mellitus type II:  -Continues to be uncontrolled with a hemoglobin A1c of greater than 12 -We will need to monitor closely while n.p.o.  Diabetic coordinator consultation  generalized weakness -PT OT and ambulation  Chronic systolic CHF:  -Patient appears not to show any significant signs of fluid overload at this time.  Last EF noted to be around 30% in 11/2018. -Repeat echo shows: EF of 30 to 35% with severe MR  Hyperlipidemia -Continue atorvastatin  Mild hypernatremia -Recheck in the a.m. -DC IV fluids  BPH -Continue Flomax   Normocytic anemia: Hemoglobin 10.3 appears stable compared to previous values seen on care everywhere. -Continue to monitor  obesity Body mass index is 33.49 kg/m.   Family Communication/Anticipated D/C date and plan/Code Status   DVT prophylaxis: Heparin Code Status: Full Code.  Family Communication:  Disposition Plan: Patient to remain in hospital for TEE on Monday.  High risk of further decompensation due to new worrisome mitral regurgitation.  After TEE consideration will be given to heart cath  pending patient's creatinine   Medical Consultants:   Cardiology   Subjective:   Patient states he gets short of breath with moving around the room Patient states he had a large bowel movement and is pleased  Objective:    Vitals:   09/15/19 0429 09/15/19 0804 09/15/19 0852 09/15/19 1227  BP: (!) 90/59 (!) 92/58  104/63  Pulse: 73 74  79  Resp:  18   16  Temp: (!) 97.5 F (36.4 C)   98 F (36.7 C)  TempSrc: Oral   Axillary  SpO2: 99%  99% 99%  Weight: 88.5 kg     Height:        Intake/Output Summary (Last 24 hours) at 09/15/2019 1412 Last data filed at 09/15/2019 3825 Gross per 24 hour  Intake 1222.81 ml  Output 350 ml  Net 872.81 ml   Filed Weights   09/14/19 0126 09/15/19 0429  Weight: 88.5 kg 88.5 kg    Exam: Patient in chair, no acute distress Lungs clear, no wheezing, no increased work of breathing Positive bowel sounds, soft, obese No lower extremity edema Alert and oriented x3  Data Reviewed:   I have personally reviewed following labs and imaging studies:  Labs: Labs show the following:   Basic Metabolic Panel: Recent Labs  Lab 09/14/19 0129 09/15/19 0455  NA 143 146*  K 3.7 3.9  CL 109 113*  CO2 21* 23  GLUCOSE 72 70  BUN 23 28*  CREATININE 2.34* 2.05*  CALCIUM 9.3 9.0   GFR Estimated Creatinine Clearance: 33.6 mL/min (A) (by C-G formula based on SCr of 2.05 mg/dL (H)). Liver Function Tests: No results for input(s): AST, ALT, ALKPHOS, BILITOT, PROT, ALBUMIN in the last 168 hours. No results for input(s): LIPASE, AMYLASE in the last 168 hours. No results for input(s): AMMONIA in the last 168 hours. Coagulation profile No results for input(s): INR, PROTIME in the last 168 hours.  CBC: Recent Labs  Lab 09/14/19 0129 09/15/19 0455  WBC 6.3 6.1  HGB 10.3* 9.9*  HCT 32.3* 30.8*  MCV 90.0 89.0  PLT 356 335   Cardiac Enzymes: No results for input(s): CKTOTAL, CKMB, CKMBINDEX, TROPONINI in the last 168 hours. BNP (last 3 results) No results for input(s): PROBNP in the last 8760 hours. CBG: Recent Labs  Lab 09/14/19 1206 09/14/19 1633 09/14/19 2203 09/15/19 0608 09/15/19 1058  GLUCAP 85 127* 122* 66* 109*   D-Dimer: No results for input(s): DDIMER in the last 72 hours. Hgb A1c: Recent Labs    09/14/19 1018  HGBA1C 12.9*   Lipid Profile: Recent Labs    09/14/19 0415   CHOL 120  HDL 26*  LDLCALC 68  TRIG 130  CHOLHDL 4.6   Thyroid function studies: No results for input(s): TSH, T4TOTAL, T3FREE, THYROIDAB in the last 72 hours.  Invalid input(s): FREET3 Anemia work up: No results for input(s): VITAMINB12, FOLATE, FERRITIN, TIBC, IRON, RETICCTPCT in the last 72 hours. Sepsis Labs: Recent Labs  Lab 09/14/19 0129 09/14/19 0236 09/14/19 0415 09/14/19 1420 09/14/19 1631 09/15/19 0455  WBC 6.3  --   --   --   --  6.1  LATICACIDVEN  --  2.1* 2.0* 1.1 1.5  --     Microbiology Recent Results (from the past 240 hour(s))  SARS CORONAVIRUS 2 (TAT 6-24 HRS) Nasopharyngeal Nasopharyngeal Swab     Status: None   Collection Time: 09/14/19  4:02 AM   Specimen: Nasopharyngeal Swab  Result Value Ref Range Status   SARS  Coronavirus 2 NEGATIVE NEGATIVE Final    Comment: (NOTE) SARS-CoV-2 target nucleic acids are NOT DETECTED. The SARS-CoV-2 RNA is generally detectable in upper and lower respiratory specimens during the acute phase of infection. Negative results do not preclude SARS-CoV-2 infection, do not rule out co-infections with other pathogens, and should not be used as the sole basis for treatment or other patient management decisions. Negative results must be combined with clinical observations, patient history, and epidemiological information. The expected result is Negative. Fact Sheet for Patients: HairSlick.no Fact Sheet for Healthcare Providers: quierodirigir.com This test is not yet approved or cleared by the [place] FDA and  has been authorized for detection and/or diagnosis of SARS-CoV-2 by FDA under an Emergency Use Authorization (EUA). This EUA will remain  in effect (meaning this test can be used) for the duration of the COVID-19 declaration under Section 56 4(b)(1) of the Act, 21 U.S.C. section 360bbb-3(b)(1), unless the authorization is terminated or revoked  sooner. Performed at [facility], [address], [place], [place] [address]     Procedures and diagnostic studies:  DG Chest 2 View  Result Date: 09/14/2019 CLINICAL DATA:  Chest pain EXAM: CHEST - 2 VIEW COMPARISON:  May 25, 2018 FINDINGS: The heart size and mediastinal contours are mildly enlarged. Aortic knob calcifications. Overlying median sternotomy wires. No large airspace consolidation or pleural effusion. No acute osseous abnormality. IMPRESSION: No active cardiopulmonary disease. Electronically Signed   By: Jonna Clark M.D.   On: 09/14/2019 02:19   ECHOCARDIOGRAM COMPLETE  Result Date: 09/14/2019    ECHOCARDIOGRAM REPORT   Patient Name:   NICKY KRAS Date of Exam: 09/14/2019 Medical Rec #:  [id number]       Height:       64.0 in Accession #:    [id number]      Weight:       195.0 lb Date of Birth:  03/29/1951       BSA:          1.935 m Patient Age:    68 years        BP:           107/88 mmHg Patient Gender: M               HR:           73 bpm. Exam Location:  Inpatient Procedure: 2D Echo, Cardiac Doppler and Color Doppler Indications:    Chest pain  History:        Patient has prior history of Echocardiogram examinations, most                 recent 12/10/2015. Cardiomyopathy, CAD and Previous Myocardial                 Infarction, Prior CABG, Arrythmias:NSVT; Risk Factors:Diabetes,                 Dyslipidemia, Hypertension and Current Smoker.  Sonographer:    Lavenia Atlas Referring Phys: [phone] RONDELL A SMITH IMPRESSIONS  1. Global hypokinesis; septal dyssynergy; overall moderate to severe LV dysfunction; grade 2 diastolic dysfunction; severe MR; mild LAE.  2. Left ventricular ejection fraction, by estimation, is 30 to 35%. The left ventricle has moderate to severely decreased function. The left ventricle demonstrates global hypokinesis. The left ventricular internal cavity size was mildly dilated. Left ventricular diastolic parameters are consistent with Grade II  diastolic dysfunction (pseudonormalization). Elevated left atrial pressure.  3. Right ventricular systolic function is  normal. The right ventricular size is normal. There is moderately elevated pulmonary artery systolic pressure.  4. Left atrial size was mildly dilated.  5. The mitral valve is normal in structure. Severe mitral valve regurgitation. No evidence of mitral stenosis.  6. The aortic valve is tricuspid. Aortic valve regurgitation is not visualized. Mild aortic valve sclerosis is present, with no evidence of aortic valve stenosis.  7. The inferior vena cava is normal in size with greater than 50% respiratory variability, suggesting right atrial pressure of 3 mmHg. FINDINGS  Left Ventricle: Left ventricular ejection fraction, by estimation, is 30 to 35%. The left ventricle has moderate to severely decreased function. The left ventricle demonstrates global hypokinesis. The left ventricular internal cavity size was mildly dilated. There is no left ventricular hypertrophy. Left ventricular diastolic parameters are consistent with Grade II diastolic dysfunction (pseudonormalization). Elevated left atrial pressure. Right Ventricle: The right ventricular size is normal. Right ventricular systolic function is normal. There is moderately elevated pulmonary artery systolic pressure. The tricuspid regurgitant velocity is 2.97 m/s, and with an assumed right atrial pressure of 8 mmHg, the estimated right ventricular systolic pressure is 43.3 mmHg. Left Atrium: Left atrial size was mildly dilated. Right Atrium: Right atrial size was normal in size. Pericardium: There is no evidence of pericardial effusion. Mitral Valve: The mitral valve is normal in structure. Normal mobility of the mitral valve leaflets. Mild mitral annular calcification. Severe mitral valve regurgitation. No evidence of mitral valve stenosis. Tricuspid Valve: The tricuspid valve is normal in structure. Tricuspid valve regurgitation is mild . No  evidence of tricuspid stenosis. Aortic Valve: The aortic valve is tricuspid. Aortic valve regurgitation is not visualized. Mild aortic valve sclerosis is present, with no evidence of aortic valve stenosis. Pulmonic Valve: The pulmonic valve was normal in structure. Pulmonic valve regurgitation is trivial. No evidence of pulmonic stenosis. Aorta: The aortic root is normal in size and structure. Venous: The inferior vena cava is normal in size with greater than 50% respiratory variability, suggesting right atrial pressure of 3 mmHg. IAS/Shunts: No atrial level shunt detected by color flow Doppler. Additional Comments: Global hypokinesis; septal dyssynergy; overall moderate to severe LV dysfunction; grade 2 diastolic dysfunction; severe MR; mild LAE.  LEFT VENTRICLE PLAX 2D LVIDd:         5.42 cm  Diastology LVIDs:         4.93 cm  LV e' lateral:   7.08 cm/s LV PW:         1.27 cm  LV E/e' lateral: 15.4 LV IVS:        1.29 cm  LV e' medial:    3.16 cm/s LVOT diam:     2.00 cm  LV E/e' medial:  34.5 LV SV:         45 LV SV Index:   23 LVOT Area:     3.14 cm  RIGHT VENTRICLE RV Basal diam:  2.54 cm RV S prime:     4.74 cm/s TAPSE (M-mode): 2.1 cm LEFT ATRIUM           Index       RIGHT ATRIUM           Index LA diam:      4.30 cm 2.22 cm/m  RA Area:     13.40 cm LA Vol (A4C): 68.9 ml 35.60 ml/m RA Volume:   30.90 ml  15.97 ml/m  AORTIC VALVE LVOT Vmax:   74.20 cm/s LVOT Vmean:  49.000 cm/s LVOT VTI:  0.142 m  AORTA Ao Root diam: 2.70 cm MITRAL VALVE                TRICUSPID VALVE MV Area (PHT): 3.31 cm     TR Peak grad:   35.3 mmHg MV Decel Time: 229 msec     TR Vmax:        297.00 cm/s MV E velocity: 109.00 cm/s MV A velocity: 57.40 cm/s   SHUNTS MV E/A ratio:  1.90         Systemic VTI:  0.14 m                             Systemic Diam: 2.00 cm Olga MillersBrian Crenshaw MD Electronically signed by Olga MillersBrian Crenshaw MD Signature Date/Time: 09/14/2019/1:08:11 PM    Final     Medications:   . aspirin EC  81 mg Oral Daily   . atorvastatin  80 mg Oral q1800  . fluticasone furoate-vilanterol  1 puff Inhalation Daily   And  . umeclidinium bromide  1 puff Inhalation Daily  . heparin  5,000 Units Subcutaneous Q8H  . insulin aspart  0-15 Units Subcutaneous TID WC  . insulin glargine  18 Units Subcutaneous QHS  . loratadine  10 mg Oral Daily  . metoprolol succinate  25 mg Oral Daily  . prasugrel  10 mg Oral Daily  . tamsulosin  0.4 mg Oral QPC supper   Continuous Infusions:   LOS: 0 days   Joseph ArtJessica U Grayson White  [facility]   How to contact the [facility] Attending or Consulting provider 7A - 7P or covering provider during after hours 7P -7A, for this patient?  1. Check the care team in [facility] and look for a) attending/consulting [facility] provider listed and b) the [facility] team listed 2. Log into [email or web address] and use [place]'s universal password to access. If you do not have the password, please contact the hospital operator. 3. Locate the [facility] provider you are looking for under [facility] and page to a number that you can be directly reached. 4. If you still have difficulty reaching the provider, please page the [facility] (Director on Call) for the Hospitalists listed on amion for assistance.  09/15/2019, 2:12 PM

## 2019-09-16 DIAGNOSIS — I5021 Acute systolic (congestive) heart failure: Secondary | ICD-10-CM

## 2019-09-16 DIAGNOSIS — R079 Chest pain, unspecified: Secondary | ICD-10-CM

## 2019-09-16 LAB — URINALYSIS, ROUTINE W REFLEX MICROSCOPIC
Bilirubin Urine: NEGATIVE
Glucose, UA: NEGATIVE mg/dL
Ketones, ur: NEGATIVE mg/dL
Leukocytes,Ua: NEGATIVE
Nitrite: NEGATIVE
Protein, ur: 30 mg/dL — AB
RBC / HPF: >50 RBC/hpf — ABNORMAL HIGH (ref 0–5)
Specific Gravity, Urine: 1.028 (ref 1.005–1.030)
pH: 5 (ref 5.0–8.0)

## 2019-09-16 LAB — GLUCOSE, CAPILLARY
Glucose-Capillary: 142 mg/dL — ABNORMAL HIGH (ref 70–99)
Glucose-Capillary: 213 mg/dL — ABNORMAL HIGH (ref 70–99)
Glucose-Capillary: 176 mg/dL — ABNORMAL HIGH (ref 70–99)
Glucose-Capillary: 227 mg/dL — ABNORMAL HIGH (ref 70–99)

## 2019-09-16 LAB — BASIC METABOLIC PANEL
Anion gap: 8 (ref 5–15)
BUN: 28 mg/dL — ABNORMAL HIGH (ref 8–23)
CO2: 20 mmol/L — ABNORMAL LOW (ref 22–32)
Calcium: 8.7 mg/dL — ABNORMAL LOW (ref 8.9–10.3)
Chloride: 111 mmol/L (ref 98–111)
Creatinine, Ser: 1.64 mg/dL — ABNORMAL HIGH (ref 0.61–1.24)
GFR calc Af Amer: 48 mL/min — ABNORMAL LOW (ref >60)
GFR calc non Af Amer: 42 mL/min — ABNORMAL LOW (ref >60)
Glucose, Bld: 282 mg/dL — ABNORMAL HIGH (ref 70–99)
Potassium: 4.2 mmol/L (ref 3.5–5.1)
Sodium: 139 mmol/L (ref 135–145)

## 2019-09-16 LAB — CBC
HCT: 29.1 % — ABNORMAL LOW (ref 39.0–52.0)
Hemoglobin: 9.5 g/dL — ABNORMAL LOW (ref 13.0–17.0)
MCH: 29.5 pg (ref 26.0–34.0)
MCHC: 32.6 g/dL (ref 30.0–36.0)
MCV: 90.4 fL (ref 80.0–100.0)
Platelets: 309 10*3/uL (ref 150–400)
RBC: 3.22 MIL/uL — ABNORMAL LOW (ref 4.22–5.81)
RDW: 15.4 % (ref 11.5–15.5)
WBC: 5.4 10*3/uL (ref 4.0–10.5)
nRBC: 0 % (ref 0.0–0.2)

## 2019-09-16 MED ORDER — FUROSEMIDE 10 MG/ML IJ SOLN
60.0000 mg | Freq: Once | INTRAMUSCULAR | Status: AC
  Administered 2019-09-16: 60 mg via INTRAVENOUS
  Filled 2019-09-16: qty 6

## 2019-09-16 NOTE — Progress Notes (Signed)
Progress Note  Patient Name: Mark Fernandez Date of Encounter: 09/16/2019  Primary Cardiologist: Rollene Rotunda, MD   Subjective   Patient is a 69 year old gentleman with a history of coronary artery disease and coronary artery bypass grafting, supraventricular tachycardia, mild chronic systolic congestive heart failure, diabetes mellitus, hypertension and hyperlipidemia.  He was seen several days ago for episodes of chest pain and elevated troponin.  The patient has also had acute renal insufficiency.  The plan was for continued medical therapy.  He is scheduled for TEE on Monday.  Inpatient Medications    Scheduled Meds:  aspirin EC  81 mg Oral Daily   atorvastatin  80 mg Oral q1800   fluticasone furoate-vilanterol  1 puff Inhalation Daily   And   umeclidinium bromide  1 puff Inhalation Daily   heparin  5,000 Units Subcutaneous Q8H   insulin aspart  0-15 Units Subcutaneous TID WC   insulin glargine  18 Units Subcutaneous QHS   loratadine  10 mg Oral Daily   metoprolol succinate  25 mg Oral Daily   prasugrel  10 mg Oral Daily   tamsulosin  0.4 mg Oral QPC supper   Continuous Infusions:  [START ON 09/18/2019] sodium chloride     PRN Meds: acetaminophen, albuterol, alum & mag hydroxide-simeth, ondansetron (ZOFRAN) IV   Vital Signs    Vitals:   09/15/19 1739 09/15/19 2330 09/16/19 0542 09/16/19 0907  BP: 116/78 104/67 (!) 91/55   Pulse: 88 99 92   Resp: 16 16 16    Temp: (!) 97.1 F (36.2 C) 98.4 F (36.9 C) 98.1 F (36.7 C)   TempSrc: Oral Oral Oral   SpO2: 100% 99% 100% 99%  Weight:  88.3 kg    Height:        Intake/Output Summary (Last 24 hours) at 09/16/2019 1131 Last data filed at 09/15/2019 1700 Gross per 24 hour  Intake 120 ml  Output --  Net 120 ml   Last 3 Weights 09/15/2019 09/15/2019 09/14/2019  Weight (lbs) 194 lb 10.7 oz 195 lb 1.6 oz 195 lb  Weight (kg) 88.3 kg 88.497 kg 88.451 kg      Telemetry    NSR , BBB - Personally  Reviewed  ECG     - Personally Reviewed  Physical Exam   GEN:    Morbidly obese gentleman.  No acute distress. Neck: No JVD Cardiac: RRR, no murmurs, rubs, or gallops.  Respiratory: Clear to auscultation bilaterally. GI: Soft, nontender, non-distended , morbid obesity. MS: No edema; No deformity. Neuro:  Nonfocal  Psych: Normal affect   Labs    High Sensitivity Troponin:   Recent Labs  Lab 09/14/19 0129 09/14/19 0415  TROPONINIHS 138* 142*      Chemistry Recent Labs  Lab 09/14/19 0129 09/15/19 0455 09/16/19 0845  NA 143 146* 139  K 3.7 3.9 4.2  CL 109 113* 111  CO2 21* 23 20*  GLUCOSE 72 70 282*  BUN 23 28* 28*  CREATININE 2.34* 2.05* 1.64*  CALCIUM 9.3 9.0 8.7*  GFRNONAA 27* 32* 42*  GFRAA 31* 37* 48*  ANIONGAP 13 10 8      Hematology Recent Labs  Lab 09/14/19 0129 09/15/19 0455 09/16/19 0845  WBC 6.3 6.1 5.4  RBC 3.59* 3.46* 3.22*  HGB 10.3* 9.9* 9.5*  HCT 32.3* 30.8* 29.1*  MCV 90.0 89.0 90.4  MCH 28.7 28.6 29.5  MCHC 31.9 32.1 32.6  RDW 15.1 15.7* 15.4  PLT 356 335 309    BNPNo results  for input(s): BNP, PROBNP in the last 168 hours.   DDimer No results for input(s): DDIMER in the last 168 hours.   Radiology    ECHOCARDIOGRAM COMPLETE  Result Date: 09/14/2019    ECHOCARDIOGRAM REPORT   Patient Name:   Mark Fernandez Date of Exam: 09/14/2019 Medical Rec #:  [id number]       Height:       64.0 in Accession #:    [id number]      Weight:       195.0 lb Date of Birth:  [birth year]       BSA:          1.935 m Patient Age:    68 years        BP:           107/88 mmHg Patient Gender: M               HR:           73 bpm. Exam Location:  Inpatient Procedure: 2D Echo, Cardiac Doppler and Color Doppler Indications:    Chest pain  History:        Patient has prior history of Echocardiogram examinations, most                 recent 12/10/2015. Cardiomyopathy, CAD and Previous Myocardial                 Infarction, Prior CABG, Arrythmias:NSVT; Risk  Factors:Diabetes,                 Dyslipidemia, Hypertension and Current Smoker.  Sonographer:    Dustin Flock Referring Phys: [phone] [place]  1. Global hypokinesis; septal dyssynergy; overall moderate to severe LV dysfunction; grade 2 diastolic dysfunction; severe MR; mild LAE.  2. Left ventricular ejection fraction, by estimation, is 30 to 35%. The left ventricle has moderate to severely decreased function. The left ventricle demonstrates global hypokinesis. The left ventricular internal cavity size was mildly dilated. Left ventricular diastolic parameters are consistent with Grade II diastolic dysfunction (pseudonormalization). Elevated left atrial pressure.  3. Right ventricular systolic function is normal. The right ventricular size is normal. There is moderately elevated pulmonary artery systolic pressure.  4. Left atrial size was mildly dilated.  5. The mitral valve is normal in structure. Severe mitral valve regurgitation. No evidence of mitral stenosis.  6. The aortic valve is tricuspid. Aortic valve regurgitation is not visualized. Mild aortic valve sclerosis is present, with no evidence of aortic valve stenosis.  7. The inferior vena cava is normal in size with greater than 50% respiratory variability, suggesting right atrial pressure of 3 mmHg. FINDINGS  Left Ventricle: Left ventricular ejection fraction, by estimation, is 30 to 35%. The left ventricle has moderate to severely decreased function. The left ventricle demonstrates global hypokinesis. The left ventricular internal cavity size was mildly dilated. There is no left ventricular hypertrophy. Left ventricular diastolic parameters are consistent with Grade II diastolic dysfunction (pseudonormalization). Elevated left atrial pressure. Right Ventricle: The right ventricular size is normal. Right ventricular systolic function is normal. There is moderately elevated pulmonary artery systolic pressure. The tricuspid regurgitant  velocity is 2.97 m/s, and with an assumed right atrial pressure of 8 mmHg, the estimated right ventricular systolic pressure is 32.3 mmHg. Left Atrium: Left atrial size was mildly dilated. Right Atrium: Right atrial size was normal in size. Pericardium: There is no evidence of pericardial effusion. Mitral Valve: The mitral valve is normal in structure.  Normal mobility of the mitral valve leaflets. Mild mitral annular calcification. Severe mitral valve regurgitation. No evidence of mitral valve stenosis. Tricuspid Valve: The tricuspid valve is normal in structure. Tricuspid valve regurgitation is mild . No evidence of tricuspid stenosis. Aortic Valve: The aortic valve is tricuspid. Aortic valve regurgitation is not visualized. Mild aortic valve sclerosis is present, with no evidence of aortic valve stenosis. Pulmonic Valve: The pulmonic valve was normal in structure. Pulmonic valve regurgitation is trivial. No evidence of pulmonic stenosis. Aorta: The aortic root is normal in size and structure. Venous: The inferior vena cava is normal in size with greater than 50% respiratory variability, suggesting right atrial pressure of 3 mmHg. IAS/Shunts: No atrial level shunt detected by color flow Doppler. Additional Comments: Global hypokinesis; septal dyssynergy; overall moderate to severe LV dysfunction; grade 2 diastolic dysfunction; severe MR; mild LAE.  LEFT VENTRICLE PLAX 2D LVIDd:         5.42 cm  Diastology LVIDs:         4.93 cm  LV e' lateral:   7.08 cm/s LV PW:         1.27 cm  LV E/e' lateral: 15.4 LV IVS:        1.29 cm  LV e' medial:    3.16 cm/s LVOT diam:     2.00 cm  LV E/e' medial:  34.5 LV SV:         45 LV SV Index:   23 LVOT Area:     3.14 cm  RIGHT VENTRICLE RV Basal diam:  2.54 cm RV S prime:     4.74 cm/s TAPSE (M-mode): 2.1 cm LEFT ATRIUM           Index       RIGHT ATRIUM           Index LA diam:      4.30 cm 2.22 cm/m  RA Area:     13.40 cm LA Vol (A4C): 68.9 ml 35.60 ml/m RA Volume:   30.90 ml   15.97 ml/m  AORTIC VALVE LVOT Vmax:   74.20 cm/s LVOT Vmean:  49.000 cm/s LVOT VTI:    0.142 m  AORTA Ao Root diam: 2.70 cm MITRAL VALVE                TRICUSPID VALVE MV Area (PHT): 3.31 cm     TR Peak grad:   35.3 mmHg MV Decel Time: 229 msec     TR Vmax:        297.00 cm/s MV E velocity: 109.00 cm/s MV A velocity: 57.40 cm/s   SHUNTS MV E/A ratio:  1.90         Systemic VTI:  0.14 m                             Systemic Diam: 2.00 cm Olga Millers MD Electronically signed by Olga Millers MD Signature Date/Time: 09/14/2019/1:08:11 PM    Final     Cardiac Studies     Patient Profile     69 y.o. male   Assessment & Plan    1.   Coronary artery disease.  The patient has had some episodes of coronary artery disease.  He is not a great candidate with cath due to acute kidney injury but his creatinine is gradually improving.  Dr. Antoine Poche wanted to wait and see if his  Kidney functoin  improved before we committed to doing a  heart catheterization.  2.  Left bundle branch block: This apparently is a new issue.  3.  Mitral regurgitation.  This apparently is worse and his previous echo.  He is scheduled for transesophageal echo on Monday.    For questions or updates, please contact [facility] Please consult [email or web address] for contact info under        Signed, Kristeen Miss, MD  09/16/2019, 11:31 AM

## 2019-09-16 NOTE — Progress Notes (Addendum)
PROGRESS NOTE    Mark Fernandez  YWV:[id number] DOB: Jan 15, 1951 DOA: 09/14/2019 PCP: Jarome Matin, MD    Brief Narrative:  Patient was admitted to the hospital with working diagnosis of atypical chest pain, in the setting of acute kidney injury chronic kidney disease.  69 year old male who presented with chest pain.  He does have significant past medical history for hypertension, dyslipidemia, coronary disease, status post bypass grafting in 2007, angioplasty 2020, type 2 diabetes mellitus and systolic heart failure.  Patient reported substernal chest pain, pressure in nature.  On his initial physical examination, 107/65, heart rate 78, respiratory rate 17, oxygen saturation 99%.  Lungs are clear to auscultation bilaterally, he had a 3/6 systolic murmur the apex, abdomen soft, no lower extremity edema. Sodium 143, potassium 3.7, chloride 109, bicarb 21, glucose 72, BUN 23, creatinine 2.34, white count 6.1, hemoglobin 9.9, hematocrit 30.8, platelets 335.  His chest radiograph had mild cardiomegaly, bilateral interstitial infiltrates, fluid in the fissures, and cephalization of vasculature.  EKG 74 bpm, left axis deviation, left bundle branch block, manually corrected QTC 494, sinus rhythm, no ST segment or T wave changes.  Patient this am with worsening dyspnea on exertion.   Assessment & Plan:   Principal Problem:   Chest pain Active Problems:   Coronary artery disease   Diabetes (HCC)   Acute kidney injury superimposed on chronic kidney disease (HCC)   Obesity (BMI 30.0-34.9)   Lactic acidosis   Prolonged QT interval   Normocytic anemia   Dyspnea on minimal exertion   1. Systolic heart failure, with acute decompensation.  Patient this am with persistent dyspnea on exertion, has rales at bases bilaterally and his chest film is suggestive of pulmonary edema. Sp CABG and PCI ischemic cardiomyopathy, with mitral regurgitation. Manually corrected Qtc is 494 (note patient has  LBBB)  Will add one dose of furosemide 60 mg IV x1 and will follow up on renal function and urine output. Plan for TEE on Monday. Possible coronary angiography pending renal function.   Continue with aspirin and statin.   Patient continue to be at high risk for worsening heart failure and renal failure.   2. AKI on ckd stage 2/ hypernatremia/ cardiorenal syndrome. Renal function with serum cr at 1,6 with K at 4,2 and serum bicarbonate at 28. Today with signs of volume overload, will add one dose of furosemide and will follow up on renal function in am. Continue to avoid nephrotoxic medications and avoid hypotension.  3. T2DM/ dyslipidemia. Will continue glucose cover and monitoring with insulin sliding scale, patient is tolerating po well. Continue with statin therapy/. Continue basal insulin 18 units. Fasting glucose is 282  4. Anemia/ multifactorial.Continue close follow up of cell count.   5. Obesity class 2. Calculated BMI is 33, will follow as outpatient.   DVT prophylaxis: enoxaparin   Code Status: full Family Communication: no family at the bedside  Disposition Plan/ discharge barriers: patient from home, barrier for dc decompensated ischemic cardiomyopathy, needing further cardiac workup.    Consultants:   Cardiology     Subjective: Patient is feeling dyspneic this am, worse with exertion, no nausea or vomiting, no chest pain. Symptoms are worse compared to yesterday.   Objective: Vitals:   09/15/19 2330 09/16/19 0542 09/16/19 0907 09/16/19 1217  BP: 104/67 (!) 91/55  121/70  Pulse: 99 92  86  Resp: 16 16  18   Temp: 98.4 F (36.9 C) 98.1 F (36.7 C)  97.7 F (36.5 C)  TempSrc:  Oral Oral  Oral  SpO2: 99% 100% 99% 100%  Weight: 88.3 kg     Height:        Intake/Output Summary (Last 24 hours) at 09/16/2019 1301 Last data filed at 09/15/2019 1700 Gross per 24 hour  Intake 120 ml  Output --  Net 120 ml   Filed Weights   09/14/19 0126 09/15/19 0429 09/15/19 2330   Weight: 88.5 kg 88.5 kg 88.3 kg    Examination:   General: Not in pain, positive dyspnea  Neurology: Awake and alert, non focal  E ENT: mild pallor, no icterus, oral mucosa moist Cardiovascular: No JVD. S1-S2 present, rhythmic, no gallops, rubs, or murmurs. Trace lower extremity edema. Pulmonary: positive breath sounds bilaterally, decreased air movement, no wheezing, or rhonchi, positive bilateral rales. Gastrointestinal. Abdomen with no organomegaly, non tender, no rebound or guarding Skin. No rashes Musculoskeletal: no joint deformities     Data Reviewed: I have personally reviewed following labs and imaging studies  CBC: Recent Labs  Lab 09/14/19 0129 09/15/19 0455 09/16/19 0845  WBC 6.3 6.1 5.4  HGB 10.3* 9.9* 9.5*  HCT 32.3* 30.8* 29.1*  MCV 90.0 89.0 90.4  PLT 356 335 812   Basic Metabolic Panel: Recent Labs  Lab 09/14/19 0129 09/15/19 0455 09/16/19 0845  NA 143 146* 139  K 3.7 3.9 4.2  CL 109 113* 111  CO2 21* 23 20*  GLUCOSE 72 70 282*  BUN 23 28* 28*  CREATININE 2.34* 2.05* 1.64*  CALCIUM 9.3 9.0 8.7*   GFR: Estimated Creatinine Clearance: 42 mL/min (A) (by C-G formula based on SCr of 1.64 mg/dL (H)). Liver Function Tests: No results for input(s): AST, ALT, ALKPHOS, BILITOT, PROT, ALBUMIN in the last 168 hours. No results for input(s): LIPASE, AMYLASE in the last 168 hours. No results for input(s): AMMONIA in the last 168 hours. Coagulation Profile: No results for input(s): INR, PROTIME in the last 168 hours. Cardiac Enzymes: No results for input(s): CKTOTAL, CKMB, CKMBINDEX, TROPONINI in the last 168 hours. BNP (last 3 results) No results for input(s): PROBNP in the last 8760 hours. HbA1C: Recent Labs    09/14/19 1018  HGBA1C 12.9*   CBG: Recent Labs  Lab 09/15/19 1058 09/15/19 1601 09/15/19 2110 09/16/19 0605 09/16/19 1111  GLUCAP 109* 106* 169* 142* 213*   Lipid Profile: Recent Labs    09/14/19 0415  CHOL 120  HDL 26*   LDLCALC 68  TRIG 130  CHOLHDL 4.6   Thyroid Function Tests: No results for input(s): TSH, T4TOTAL, FREET4, T3FREE, THYROIDAB in the last 72 hours. Anemia Panel: No results for input(s): VITAMINB12, FOLATE, FERRITIN, TIBC, IRON, RETICCTPCT in the last 72 hours.    Radiology Studies: I have reviewed all of the imaging during this hospital visit personally     Scheduled Meds: . aspirin EC  81 mg Oral Daily  . atorvastatin  80 mg Oral q1800  . fluticasone furoate-vilanterol  1 puff Inhalation Daily   And  . umeclidinium bromide  1 puff Inhalation Daily  . furosemide  60 mg Intravenous Once  . heparin  5,000 Units Subcutaneous Q8H  . insulin aspart  0-15 Units Subcutaneous TID WC  . insulin glargine  18 Units Subcutaneous QHS  . loratadine  10 mg Oral Daily  . metoprolol succinate  25 mg Oral Daily  . prasugrel  10 mg Oral Daily  . tamsulosin  0.4 mg Oral QPC supper   Continuous Infusions: . [START ON 09/18/2019] sodium chloride  LOS: 1 day        Mark Fernandez Gerome Apley, MD

## 2019-09-17 ENCOUNTER — Inpatient Hospital Stay (HOSPITAL_COMMUNITY): Payer: Medicare Other

## 2019-09-17 LAB — BASIC METABOLIC PANEL
Anion gap: 9 (ref 5–15)
BUN: 29 mg/dL — ABNORMAL HIGH (ref 8–23)
CO2: 21 mmol/L — ABNORMAL LOW (ref 22–32)
Calcium: 8.9 mg/dL (ref 8.9–10.3)
Chloride: 112 mmol/L — ABNORMAL HIGH (ref 98–111)
Creatinine, Ser: 1.78 mg/dL — ABNORMAL HIGH (ref 0.61–1.24)
GFR calc Af Amer: 44 mL/min — ABNORMAL LOW (ref >60)
GFR calc non Af Amer: 38 mL/min — ABNORMAL LOW (ref >60)
Glucose, Bld: 223 mg/dL — ABNORMAL HIGH (ref 70–99)
Potassium: 4.1 mmol/L (ref 3.5–5.1)
Sodium: 142 mmol/L (ref 135–145)

## 2019-09-17 LAB — GLUCOSE, CAPILLARY
Glucose-Capillary: 186 mg/dL — ABNORMAL HIGH (ref 70–99)
Glucose-Capillary: 223 mg/dL — ABNORMAL HIGH (ref 70–99)
Glucose-Capillary: 169 mg/dL — ABNORMAL HIGH (ref 70–99)
Glucose-Capillary: 162 mg/dL — ABNORMAL HIGH (ref 70–99)

## 2019-09-17 NOTE — Progress Notes (Signed)
Progress Note  Patient Name: JUSHUA WALTMAN Date of Encounter: 09/17/2019  Primary Cardiologist: Minus Breeding, MD   Subjective   Patient is a 69 year old gentleman with a history of coronary artery disease and coronary artery bypass grafting, supraventricular tachycardia, mild chronic systolic congestive heart failure, diabetes mellitus, hypertension and hyperlipidemia.  He was seen several days ago for episodes of chest pain and elevated troponin.  The patient has also had acute renal insufficiency.  The plan was for continued medical therapy.  He is scheduled for TEE on Monday.  Inpatient Medications    Scheduled Meds: . aspirin EC  81 mg Oral Daily  . atorvastatin  80 mg Oral q1800  . fluticasone furoate-vilanterol  1 puff Inhalation Daily   And  . umeclidinium bromide  1 puff Inhalation Daily  . heparin  5,000 Units Subcutaneous Q8H  . insulin aspart  0-15 Units Subcutaneous TID WC  . insulin glargine  18 Units Subcutaneous QHS  . loratadine  10 mg Oral Daily  . metoprolol succinate  25 mg Oral Daily  . prasugrel  10 mg Oral Daily  . tamsulosin  0.4 mg Oral QPC supper   Continuous Infusions: . [START ON 09/18/2019] sodium chloride     PRN Meds: acetaminophen, albuterol, alum & mag hydroxide-simeth, ondansetron (ZOFRAN) IV   Vital Signs    Vitals:   09/17/19 0517 09/17/19 0741 09/17/19 1208 09/17/19 1246  BP: 107/68  96/65   Pulse: 80  79   Resp: 18  16   Temp: 98.1 F (36.7 C)  (!) 97.5 F (36.4 C)   TempSrc: Oral  Oral   SpO2: 100% 99% 100% 100%  Weight: 89.9 kg     Height:        Intake/Output Summary (Last 24 hours) at 09/17/2019 1411 Last data filed at 09/17/2019 1300 Gross per 24 hour  Intake --  Output 450 ml  Net -450 ml   Last 3 Weights 09/17/2019 09/15/2019 09/15/2019  Weight (lbs) 198 lb 4.8 oz 194 lb 10.7 oz 195 lb 1.6 oz  Weight (kg) 89.948 kg 88.3 kg 88.497 kg      Telemetry    NSR , BBB  - Personally Reviewed  ECG     - Personally  Reviewed  Physical Exam    Physical Exam: Blood pressure 96/65, pulse 79, temperature (!) 97.5 F (36.4 C), temperature source Oral, resp. rate 16, height 5\' 4"  (1.626 m), weight 89.9 kg, SpO2 100 %.  GEN:  Morbidly obese male,  NAD  HEENT: Normal NECK: No JVD; No carotid bruits LYMPHATICS: No lymphadenopathy CARDIAC: RRR  RESPIRATORY:  Clear to auscultation without rales, wheezing or rhonchi  ABDOMEN: Soft, non-tender, non-distended MUSCULOSKELETAL:  No edema; No deformity  SKIN: Warm and dry NEUROLOGIC:  Alert and oriented x 3   Labs    High Sensitivity Troponin:   Recent Labs  Lab 09/14/19 0129 09/14/19 0415  TROPONINIHS 138* 142*      Chemistry Recent Labs  Lab 09/15/19 0455 09/16/19 0845 09/17/19 0238  NA 146* 139 142  K 3.9 4.2 4.1  CL 113* 111 112*  CO2 23 20* 21*  GLUCOSE 70 282* 223*  BUN 28* 28* 29*  CREATININE 2.05* 1.64* 1.78*  CALCIUM 9.0 8.7* 8.9  GFRNONAA 32* 42* 38*  GFRAA 37* 48* 44*  ANIONGAP 10 8 9      Hematology Recent Labs  Lab 09/14/19 0129 09/15/19 0455 09/16/19 0845  WBC 6.3 6.1 5.4  RBC 3.59* 3.46* 3.22*  HGB 10.3* 9.9* 9.5*  HCT 32.3* 30.8* 29.1*  MCV 90.0 89.0 90.4  MCH 28.7 28.6 29.5  MCHC 31.9 32.1 32.6  RDW 15.1 15.7* 15.4  PLT 356 335 309    BNPNo results for input(s): BNP, PROBNP in the last 168 hours.   DDimer No results for input(s): DDIMER in the last 168 hours.   Radiology    No results found.  Cardiac Studies     Patient Profile     69 y.o. male   Assessment & Plan    1.   Coronary artery disease.  Has had several episodes of CP .  .  Dr. Antoine Poche wanted to wait and see if his  Kidney functoin  improved before we committed to doing a heart catheterization. Creatinine is 1.78  Anticipate cath ? Tuesday if creatinine is stable   2.  Left bundle branch block: This apparently is a new issue.  3.  Mitral regurgitation.  MR has worsened recently. For TEE tomorrow    For questions or updates,  please contact [facility] Please consult [email or web address] for contact info under        Signed, Kristeen Miss, MD  09/17/2019, 2:11 PM

## 2019-09-17 NOTE — Progress Notes (Signed)
PROGRESS NOTE    Mark Fernandez  IDP:[id number] DOB: [birth year] DOA: 09/14/2019 PCP: Jarome Matin, MD    Brief Narrative:  Patient was admitted to the hospital with working diagnosis of atypical chest pain, in the setting of acute kidney injury chronic kidney disease.  69 year old male who presented with chest pain.  He does have significant past medical history for hypertension, dyslipidemia, coronary disease, status post bypass grafting in 2007, angioplasty 2020, type 2 diabetes mellitus and systolic heart failure.  Patient reported substernal chest pain, pressure in nature.  On his initial physical examination, 107/65, heart rate 78, respiratory rate 17, oxygen saturation 99%.  Lungs are clear to auscultation bilaterally, he had a 3/6 systolic murmur the apex, abdomen soft, no lower extremity edema. Sodium 143, potassium 3.7, chloride 109, bicarb 21, glucose 72, BUN 23, creatinine 2.34, white count 6.1, hemoglobin 9.9, hematocrit 30.8, platelets 335.  His chest radiograph had mild cardiomegaly, bilateral interstitial infiltrates, fluid in the fissures, and cephalization of vasculature.  EKG 74 bpm, left axis deviation, left bundle branch block, manually corrected QTC 494, sinus rhythm, no ST segment or T wave changes.  Patient received furosemide for signs of volume overload and cardiorenal syndrome. Plan for further workup with TEE for evaluation of mitral valve regurgitation.    Assessment & Plan:   Principal Problem:   Chest pain Active Problems:   Coronary artery disease   Diabetes (HCC)   Acute kidney injury superimposed on chronic kidney disease (HCC)   Obesity (BMI 30.0-34.9)   Lactic acidosis   Prolonged QT interval   Normocytic anemia   Dyspnea on minimal exertion   1. Systolic heart failure, with acute decompensation.  Patient continue to have dyspnea on exertion, Sp CABG and PCI ischemic cardiomyopathy, with mitral regurgitation. Manually corrected Qtc is 494 (note  patient has LBBB).  Mild improvement in dyspnea with diuresis, not accurate documentation of urine output. Follow chest radiograph with no persistent congested hilar vasculature per my interpretation.   Considering worsening renal function with diuretic will hold on further diuresis for now and will follow on TEE on Monday/ he may benefit from right heart catheterization as well.  On aspirin and statin.   Patient continue to be at high risk for worsening heart failure and renal failure.   2. AKI on ckd stage 2/ hypernatremia/ cardiorenal syndrome. Renal function with serum cr at 1,78 with K at 4,1 and serum bicarbonate at 21. Will follow on renal function in am.   3. T2DM/ dyslipidemia. Gucose cover and monitoring with insulin sliding scale, patient is tolerating po well. On statin therapy/. Continue basal insulin 18 units. Fasting glucose is 223  4. Anemia/ multifactorial.Cell count has been stable  5. Obesity class 2. Calculated BMI is 33,   DVT prophylaxis: enoxaparin   Code Status: full Family Communication: no family at the bedside  Disposition Plan/ discharge barriers: patient from home, barrier for dc decompensated ischemic cardiomyopathy, needing further cardiac workup.     Consultants:   Cardiology    Subjective: Patient has mild improvement in dyspnea, but not yet back to baseline, no nausea or vomiting, his symptoms seem to be worse with exertion, no nausea or vomiting.   Objective: Vitals:   09/16/19 1757 09/17/19 0042 09/17/19 0517 09/17/19 0741  BP: 108/66 105/65 107/68   Pulse: 87 82 80   Resp: 18 18 18    Temp: 98.1 F (36.7 C) 98 F (36.7 C) 98.1 F (36.7 C)   TempSrc: Oral Oral Oral  SpO2: 100% 96% 100% 99%  Weight:   89.9 kg   Height:       No intake or output data in the 24 hours ending 09/17/19 1012 Filed Weights   09/15/19 0429 09/15/19 2330 09/17/19 0517  Weight: 88.5 kg 88.3 kg 89.9 kg    Examination:   General: deconditioned   Neurology: Awake and alert, non focal  E ENT: mild pallor, no icterus, oral mucosa moist Cardiovascular: No JVD. S1-S2 present, rhythmic, no gallops, rubs, or murmurs. No lower extremity edema. Pulmonary: positive breath sounds bilaterally, adequate air movement, no wheezing, rhonchi or rales. Gastrointestinal. Abdomen with no organomegaly, non tender, no rebound or guarding Skin. No rashes Musculoskeletal: no joint deformities     Data Reviewed: I have personally reviewed following labs and imaging studies  CBC: Recent Labs  Lab 09/14/19 0129 09/15/19 0455 09/16/19 0845  WBC 6.3 6.1 5.4  HGB 10.3* 9.9* 9.5*  HCT 32.3* 30.8* 29.1*  MCV 90.0 89.0 90.4  PLT 356 335 811   Basic Metabolic Panel: Recent Labs  Lab 09/14/19 0129 09/15/19 0455 09/16/19 0845 09/17/19 0238  NA 143 146* 139 142  K 3.7 3.9 4.2 4.1  CL 109 113* 111 112*  CO2 21* 23 20* 21*  GLUCOSE 72 70 282* 223*  BUN 23 28* 28* 29*  CREATININE 2.34* 2.05* 1.64* 1.78*  CALCIUM 9.3 9.0 8.7* 8.9   GFR: Estimated Creatinine Clearance: 39.1 mL/min (A) (by C-G formula based on SCr of 1.78 mg/dL (H)). Liver Function Tests: No results for input(s): AST, ALT, ALKPHOS, BILITOT, PROT, ALBUMIN in the last 168 hours. No results for input(s): LIPASE, AMYLASE in the last 168 hours. No results for input(s): AMMONIA in the last 168 hours. Coagulation Profile: No results for input(s): INR, PROTIME in the last 168 hours. Cardiac Enzymes: No results for input(s): CKTOTAL, CKMB, CKMBINDEX, TROPONINI in the last 168 hours. BNP (last 3 results) No results for input(s): PROBNP in the last 8760 hours. HbA1C: Recent Labs    09/14/19 1018  HGBA1C 12.9*   CBG: Recent Labs  Lab 09/16/19 0605 09/16/19 1111 09/16/19 1617 09/16/19 2122 09/17/19 0603  GLUCAP 142* 213* 176* 227* 186*   Lipid Profile: No results for input(s): CHOL, HDL, LDLCALC, TRIG, CHOLHDL, LDLDIRECT in the last 72 hours. Thyroid Function Tests: No  results for input(s): TSH, T4TOTAL, FREET4, T3FREE, THYROIDAB in the last 72 hours. Anemia Panel: No results for input(s): VITAMINB12, FOLATE, FERRITIN, TIBC, IRON, RETICCTPCT in the last 72 hours.    Radiology Studies: I have reviewed all of the imaging during this hospital visit personally     Scheduled Meds: . aspirin EC  81 mg Oral Daily  . atorvastatin  80 mg Oral q1800  . fluticasone furoate-vilanterol  1 puff Inhalation Daily   And  . umeclidinium bromide  1 puff Inhalation Daily  . heparin  5,000 Units Subcutaneous Q8H  . insulin aspart  0-15 Units Subcutaneous TID WC  . insulin glargine  18 Units Subcutaneous QHS  . loratadine  10 mg Oral Daily  . metoprolol succinate  25 mg Oral Daily  . prasugrel  10 mg Oral Daily  . tamsulosin  0.4 mg Oral QPC supper   Continuous Infusions: . [START ON 09/18/2019] sodium chloride       LOS: 2 days        Ayame Rena Gerome Apley, MD

## 2019-09-18 ENCOUNTER — Encounter (HOSPITAL_COMMUNITY): Payer: Self-pay | Admitting: Internal Medicine

## 2019-09-18 ENCOUNTER — Inpatient Hospital Stay (HOSPITAL_COMMUNITY): Payer: Medicare Other | Admitting: Certified Registered Nurse Anesthetist

## 2019-09-18 ENCOUNTER — Encounter (HOSPITAL_COMMUNITY)
Admission: EM | Disposition: A | Discharge status: Home / Self Care | Payer: Self-pay | Source: Home / Self Care | Attending: Internal Medicine

## 2019-09-18 ENCOUNTER — Inpatient Hospital Stay (HOSPITAL_COMMUNITY): Payer: Medicare Other

## 2019-09-18 DIAGNOSIS — I340 Nonrheumatic mitral (valve) insufficiency: Secondary | ICD-10-CM

## 2019-09-18 DIAGNOSIS — I5023 Acute on chronic systolic (congestive) heart failure: Secondary | ICD-10-CM

## 2019-09-18 HISTORY — PX: TEE WITHOUT CARDIOVERSION: SHX5443

## 2019-09-18 LAB — GLUCOSE, CAPILLARY
Glucose-Capillary: 121 mg/dL — ABNORMAL HIGH (ref 70–99)
Glucose-Capillary: 148 mg/dL — ABNORMAL HIGH (ref 70–99)
Glucose-Capillary: 199 mg/dL — ABNORMAL HIGH (ref 70–99)
Glucose-Capillary: 217 mg/dL — ABNORMAL HIGH (ref 70–99)

## 2019-09-18 LAB — BASIC METABOLIC PANEL
Anion gap: 12 (ref 5–15)
BUN: 34 mg/dL — ABNORMAL HIGH (ref 8–23)
CO2: 20 mmol/L — ABNORMAL LOW (ref 22–32)
Calcium: 8.9 mg/dL (ref 8.9–10.3)
Chloride: 110 mmol/L (ref 98–111)
Creatinine, Ser: 1.74 mg/dL — ABNORMAL HIGH (ref 0.61–1.24)
GFR calc Af Amer: 45 mL/min — ABNORMAL LOW (ref >60)
GFR calc non Af Amer: 39 mL/min — ABNORMAL LOW (ref >60)
Glucose, Bld: 146 mg/dL — ABNORMAL HIGH (ref 70–99)
Potassium: 3.8 mmol/L (ref 3.5–5.1)
Sodium: 142 mmol/L (ref 135–145)

## 2019-09-18 LAB — BRAIN NATRIURETIC PEPTIDE: B Natriuretic Peptide: 945 pg/mL — ABNORMAL HIGH (ref 0.0–100.0)

## 2019-09-18 SURGERY — ECHOCARDIOGRAM, TRANSESOPHAGEAL
Anesthesia: Monitor Anesthesia Care

## 2019-09-18 MED ORDER — PROPOFOL 10 MG/ML IV BOLUS
INTRAVENOUS | Status: DC | PRN
  Administered 2019-09-18 (×2): 20 mg via INTRAVENOUS

## 2019-09-18 MED ORDER — ISOSORB DINITRATE-HYDRALAZINE 20-37.5 MG PO TABS
1.0000 | ORAL_TABLET | Freq: Three times a day (TID) | ORAL | Status: DC
  Administered 2019-09-18 – 2019-09-22 (×12): 1 via ORAL
  Filled 2019-09-18 (×12): qty 1

## 2019-09-18 MED ORDER — FUROSEMIDE 10 MG/ML IJ SOLN
160.0000 mg | Freq: Once | INTRAVENOUS | Status: AC
  Administered 2019-09-18: 160 mg via INTRAVENOUS
  Filled 2019-09-18: qty 16

## 2019-09-18 MED ORDER — POTASSIUM CHLORIDE CRYS ER 20 MEQ PO TBCR
40.0000 meq | EXTENDED_RELEASE_TABLET | Freq: Once | ORAL | Status: AC
  Administered 2019-09-18: 40 meq via ORAL
  Filled 2019-09-18: qty 2

## 2019-09-18 MED ORDER — PROPOFOL 500 MG/50ML IV EMUL
INTRAVENOUS | Status: DC | PRN
  Administered 2019-09-18: 75 ug/kg/min via INTRAVENOUS

## 2019-09-18 MED ORDER — PHENYLEPHRINE 40 MCG/ML (10ML) SYRINGE FOR IV PUSH (FOR BLOOD PRESSURE SUPPORT)
PREFILLED_SYRINGE | INTRAVENOUS | Status: DC | PRN
  Administered 2019-09-18: 80 ug via INTRAVENOUS

## 2019-09-18 NOTE — Progress Notes (Signed)
OT Cancellation Note  Patient Details Name: Mark Fernandez MRN: [id number] DOB: May 11, 1951   Cancelled Treatment:    Reason Eval/Treat Not Completed: Patient at procedure or test/ unavailable- pt off unit and will follow to see as able.   Barry Brunner, OT Acute Rehabilitation Services Pager [phone] Office [phone]   Chancy Milroy 09/18/2019, 7:49 AM

## 2019-09-18 NOTE — H&P (Signed)
   INTERVAL PROCEDURE H&P  History and Physical Interval Note:  09/18/2019 7:23 AM  Mark Fernandez has presented today for their planned procedure. The various methods of treatment have been discussed with the patient and family. After consideration of risks, benefits and other options for treatment, the patient has consented to the procedure.  The patients' outpatient history has been reviewed, patient examined, and no change in status from most recent office note within the past 30 days. I have reviewed the patients' chart and labs and will proceed as planned. Questions were answered to the patient's satisfaction.   [name], MD, [facility], FACP  [facility]  Medical Director of the [facility]  [facility] Diplomate of the American Board of Clinical Lipidology Attending Cardiologist  Direct Dial: [phone]  Fax: [phone]  Website:  [email or web address]  Mark Fernandez 09/18/2019, 7:23 AM

## 2019-09-18 NOTE — Anesthesia Preprocedure Evaluation (Addendum)
Anesthesia Evaluation  Patient identified by MRN, date of birth, ID band Patient awake    Reviewed: Allergy & Precautions, NPO status , Patient's Chart, lab work & pertinent test results  Airway Mallampati: II  TM Distance: >3 FB Neck ROM: Full    Dental  (+) Dental Advisory Given   Pulmonary neg pulmonary ROS, former smoker,    Pulmonary exam normal breath sounds clear to auscultation       Cardiovascular hypertension, + CAD, + Past MI and + CABG  Normal cardiovascular exam Rhythm:Regular Rate:Normal  Echo  1. Global hypokinesis; septal dyssynergy; overall moderate to severe LV dysfunction; grade 2 diastolic dysfunction; severe MR; mild LAE.  2. Left ventricular ejection fraction, by estimation, is 30 to 35%. The left ventricle has moderate to severely decreased function. The left ventricle demonstrates global hypokinesis. The left ventricular internal cavity size was mildly dilated. Left ventricular diastolic parameters are consistent with Grade II diastolic dysfunction (pseudonormalization). Elevated left atrial pressure.  3. Right ventricular systolic function is normal. The right ventricular size is normal. There is moderately elevated pulmonary artery systolic pressure.  4. Left atrial size was mildly dilated.  5. The mitral valve is normal in structure. Severe mitral valve regurgitation. No evidence of mitral stenosis.  6. The aortic valve is tricuspid. Aortic valve regurgitation is not visualized. Mild aortic valve sclerosis is present, with no evidence of aortic valve stenosis.  7. The inferior vena cava is normal in size with greater than 50% respiratory variability, suggesting right atrial pressure of 3 mmHg.    Neuro/Psych negative neurological ROS  negative psych ROS   GI/Hepatic negative GI ROS, Neg liver ROS,   Endo/Other  negative endocrine ROSdiabetes  Renal/GU Renal disease  negative genitourinary    Musculoskeletal negative musculoskeletal ROS (+)   Abdominal (+) + obese,   Peds negative pediatric ROS (+)  Hematology negative hematology ROS (+)   Anesthesia Other Findings   Reproductive/Obstetrics negative OB ROS                            Anesthesia Physical Anesthesia Plan  ASA: III  Anesthesia Plan: MAC   Post-op Pain Management:    Induction: Intravenous  PONV Risk Score and Plan: 1 and Propofol infusion, TIVA and Treatment may vary due to age or medical condition  Airway Management Planned: Natural Airway  Additional Equipment:   Intra-op Plan:   Post-operative Plan:   Informed Consent: I have reviewed the patients History and Physical, chart, labs and discussed the procedure including the risks, benefits and alternatives for the proposed anesthesia with the patient or authorized representative who has indicated his/her understanding and acceptance.     Dental advisory given  Plan Discussed with: CRNA  Anesthesia Plan Comments:         Anesthesia Quick Evaluation

## 2019-09-18 NOTE — TOC Initial Note (Signed)
Transition of Care [facility]) - Initial/Assessment Note    Patient Details  Name: Mark Fernandez MRN: [id number] Date of Birth: 12/06/1950  Transition of Care [facility]) CM/SW Contact:    Kingsley Plan, RN Phone Number: 09/18/2019, 4:55 PM  Clinical Narrative:                 Confirmed face sheet information. Patient agreeable to home health PT, offered medicare.gov list. Referral accepted by [facility] with Frances [facility]. PT/OT recommended Rollator and cane. Insurance will not pay for both at the same time. Patient prefers Rollator same ordered with Zack with [employer]   Expected Discharge Plan: Home w Home Health Services Barriers to Discharge: Continued Medical Work up   Patient Goals and CMS Choice Patient states their goals for this hospitalization and ongoing recovery are:: to return to home CMS Medicare.gov Compare Post Acute Care list provided to:: Patient Choice offered to / list presented to : Patient  Expected Discharge Plan and Services Expected Discharge Plan: Home w Home Health Services   Discharge Planning Services: CM Consult Post Acute Care Choice: Home Health, Durable Medical Equipment Living arrangements for the past 2 months: Single Family Home                 DME Arranged: Walker rolling with seat DME Agency: [employer] Date DME Agency Contacted: 09/18/19 Time DME Agency Contacted: 1654 Representative spoke with at DME Agency: Zack HH Arranged: PT HH Agency: [facility] Date [facility] Agency Contacted: 09/18/19 Time HH Agency Contacted: 1654 Representative spoke with at [facility] Agency: Kandee Keen  Prior Living Arrangements/Services Living arrangements for the past 2 months: Single Family Home Lives with:: Self Patient language and need for interpreter reviewed:: Yes Do you feel safe going back to the place where you live?: Yes      Need for Family Participation in Patient Care: Yes (Comment) Care giver support system in place?: Yes (comment)   Criminal  Activity/Legal Involvement Pertinent to Current Situation/Hospitalization: No - Comment as needed  Activities of Daily Living Home Assistive Devices/Equipment: Environmental consultant (specify type), Cane (specify quad or straight) ADL Screening (condition at time of admission) Patient's cognitive ability adequate to safely complete daily activities?: Yes Is the patient deaf or have difficulty hearing?: Yes Does the patient have difficulty seeing, even when wearing glasses/contacts?: No Does the patient have difficulty concentrating, remembering, or making decisions?: Yes Patient able to express need for assistance with ADLs?: Yes Does the patient have difficulty dressing or bathing?: No Independently performs ADLs?: Yes (appropriate for developmental age) Does the patient have difficulty walking or climbing stairs?: Yes Weakness of Legs: Both Weakness of Arms/Hands: Both  Permission Sought/Granted   Permission granted to share information with : No              Emotional Assessment Appearance:: Appears stated age Attitude/Demeanor/Rapport: Engaged Affect (typically observed): Accepting Orientation: : Oriented to Self, Oriented to Place, Oriented to  Time, Oriented to Situation Alcohol / Substance Use: Not Applicable Psych Involvement: No (comment)  Admission diagnosis:  Dehydration [E86.0] Dizziness [R42] Atypical chest pain [R07.89] AKI (acute kidney injury) (HCC) [N17.9] Dyspnea on minimal exertion [R06.00] Patient Active Problem List   Diagnosis Date Noted  . Acute on chronic systolic CHF (congestive heart failure) (HCC) 09/18/2019  . Dyspnea on minimal exertion 09/15/2019  . Acute kidney injury superimposed on chronic kidney disease (HCC) 09/14/2019  . Obesity (BMI 30.0-34.9) 09/14/2019  . Lactic acidosis 09/14/2019  . Prolonged QT interval 09/14/2019  .  Normocytic anemia 09/14/2019  . DKA (diabetic ketoacidosis) ([place]) 05/15/2018  . Orthostatic dizziness 05/15/2018  . Chest pain  12/09/2015  . Abdominal pain 12/09/2015  . Diabetes ([place]) 12/09/2015  . NSTEMI (non-ST elevated myocardial infarction) ([place]) 12/09/2015  . Overweight(278.02) 02/10/2011  . Hypertension   . Coronary artery disease   . Cardiomyopathy ([place])   . Hypercholesteremia   . Arrhythmia    PCP:  Leanna Battles, MD Pharmacy:   [facility] [place] [place] [address] Phone: [phone] Fax: [place], [place] [address] [place] [address] Phone: [phone] Fax: [phone]     Social Determinants of Health (SDOH) Interventions    Readmission Risk Interventions No flowsheet data found.

## 2019-09-18 NOTE — Progress Notes (Signed)
  Echocardiogram Echocardiogram Transesophageal has been performed.  Mark Fernandez 09/18/2019, 8:36 AM

## 2019-09-18 NOTE — Care Management Important Message (Signed)
Important Message  Patient Details  Name: Mark Fernandez MRN: [id number] Date of Birth: [birth year]   Medicare Important Message Given:  Yes     Dorena Bodo 09/18/2019, 2:09 PM

## 2019-09-18 NOTE — Transfer of Care (Signed)
Immediate Anesthesia Transfer of Care Note  Patient: Mark Fernandez  Procedure(s) Performed: TRANSESOPHAGEAL ECHOCARDIOGRAM (TEE) (N/A )  Patient Location: Endoscopy Unit  Anesthesia Type:MAC  Level of Consciousness: drowsy and patient cooperative  Airway & Oxygen Therapy: Patient Spontanous Breathing and Patient connected to nasal cannula oxygen  Post-op Assessment: Report given to RN, Post -op Vital signs reviewed and stable and Patient moving all extremities X 4  Post vital signs: Reviewed and stable  Last Vitals:  Vitals Value Taken Time  BP 112/58 09/18/19 0832  Temp    Pulse 71 09/18/19 0832  Resp 15 09/18/19 0832  SpO2 95 % 09/18/19 0832  Vitals shown include unvalidated device data.  Last Pain:  Vitals:   09/18/19 0743  TempSrc: Temporal  PainSc:       Patients Stated Pain Goal: 0 (15/94/70 7615)  Complications: No apparent anesthesia complications

## 2019-09-18 NOTE — Evaluation (Addendum)
Occupational Therapy Evaluation Patient Details Name: Mark Fernandez MRN: [id number] DOB: 01/28/1951 Today's Date: 09/18/2019    History of Present Illness 69 y.o. male with medical history significant of HTN, HLD, CAD s/p CABG in 2007 and s/p 2 DES in 11/2018, NSVT, DM type II, systolic CHF last LVEF 93-26% presents to ED on 3/25 with complaints of 2 days of chest pain and shortness of breath. TEE 3/29 reveals severe MR, moderate TR, LVEF 25-30%.   Clinical Impression   PTA patient independent with ADLs, mobility using cane. Admitted for above and limited by problem list below, including decreased activity tolerance, endurance and balance.  Patient with fatigues easily and slightly impulsive when needing to rest.  Patient able to complete transfers and in room mobility using RW with min guard assist, grooming standing at sink with min guard to close supervision, LB ADLs with min assist.  Initated education on energy conservation techniques and safety, educated on DME options (cane vs rollator) for mobility.  Pt reports interest in rollator at this time.  He will benefit from continued OT services while admitted and after dc at [facility] level in order to optimize independence, safety and return to PLOF. Recommend [place] aide as patient is regaining strength, due to poor tolerance to daily activities.  Will follow acutely.     Follow Up Recommendations  Home health OT;Other (comment);Supervision - Intermittent(aide )    Equipment Recommendations  Other (comment)(rollator )    Recommendations for Other Services       Precautions / Restrictions Precautions Precautions: Fall Restrictions Weight Bearing Restrictions: No      Mobility Bed Mobility Overal bed mobility: Needs Assistance             General bed mobility comments: up in recliner upon entry   Transfers Overall transfer level: Needs assistance Equipment used: Rolling walker (2 wheeled) Transfers: Sit to/from Stand Sit to  Stand: Min guard         General transfer comment: min guard for safety and balance, pt reports preference to use RW    Balance Overall balance assessment: Needs assistance Sitting-balance support: No upper extremity supported Sitting balance-Leahy Scale: Good Sitting balance - Comments: supervision    Standing balance support: Bilateral upper extremity supported;During functional activity Standing balance-Leahy Scale: Fair Standing balance comment: dynamically requires UE support                           ADL either performed or assessed with clinical judgement   ADL Overall ADL's : Needs assistance/impaired     Grooming: Supervision/safety;Standing   Upper Body Bathing: Set up;Sitting   Lower Body Bathing: Minimal assistance;Sit to/from stand   Upper Body Dressing : Set up;Sitting   Lower Body Dressing: Minimal assistance;Sit to/from stand   Toilet Transfer: Minimal assistance;Ambulation;RW Toilet Transfer Details (indicate cue type and reason): simulated in room         Functional mobility during ADLs: Min guard;Rolling walker;Cueing for safety General ADL Comments: pt limited by decreased activity tolerance     Vision   Vision Assessment?: No apparent visual deficits     Perception     Praxis      Pertinent Vitals/Pain Pain Assessment: No/denies pain     Hand Dominance Right   Extremity/Trunk Assessment Upper Extremity Assessment Upper Extremity Assessment: Generalized weakness   Lower Extremity Assessment Lower Extremity Assessment: Defer to PT evaluation RLE Deficits / Details: MMT: hip flexion 3+/5, knee extension 4/5,  knee flexion 4/5. Strength symmetric LLE Deficits / Details: MMT: hip flexion 3+/5, knee extension 4/5, knee flexion 4/5. Strength symmetric   Cervical / Trunk Assessment Cervical / Trunk Assessment: Normal   Communication Communication Communication: No difficulties   Cognition Arousal/Alertness:  Awake/alert Behavior During Therapy: WFL for tasks assessed/performed Overall Cognitive Status: Within Functional Limits for tasks assessed Area of Impairment: Problem solving;Safety/judgement                       Following Commands: Follows multi-step commands with increased time(and repeated cuing) Safety/Judgement: Decreased awareness of safety   Problem Solving: Slow processing;Requires verbal cues General Comments: pt with decreased safety awareness, impulsive with mobility back to recliner when fatigued (using RW with only 1 hand)    General Comments  pt first reports wanting a cane for dc home to "stay young", but then reprots the RW is easier and very interested in the rollator to have a seat due to decreased act tolerance     Exercises     Shoulder Instructions      Home Living Family/patient expects to be discharged to:: Private residence Living Arrangements: Alone Available Help at Discharge: Family;Friend(s);Available PRN/intermittently(friend Cristy Friedlander, other friend who works in a nursing home) Type of Home: House Home Access: Stairs to enter Secretary/administrator of Steps: 2 Entrance Stairs-Rails: Left Home Layout: One level     Bathroom Shower/Tub: Chief Strategy Officer: Handicapped height     Home Equipment: Environmental consultant - 2 wheels;Bedside commode;Shower seat   Additional Comments: RW is in [place]      Prior Functioning/Environment Level of Independence: Independent with assistive device(s)        Comments: Lost his cane on the 23rd of March, otherwise was using cane for ambulation. Pt reports no falls in the past year, states he does his own ADLs/ iADLs         OT Problem List: Decreased strength;Decreased activity tolerance;Cardiopulmonary status limiting activity;Decreased knowledge of use of DME or AE;Decreased knowledge of precautions;Impaired balance (sitting and/or standing)      OT Treatment/Interventions: Self-care/ADL  training;Energy conservation;DME and/or AE instruction;Therapeutic activities;Balance training;Patient/family education    OT Goals(Current goals can be found in the care plan section) Acute Rehab OT Goals Patient Stated Goal: have more tolerance for activity OT Goal Formulation: With patient Time For Goal Achievement: 10/02/19 Potential to Achieve Goals: Good  OT Frequency: Min 2X/week   Barriers to D/C:            Co-evaluation              AM-PAC OT "6 Clicks" Daily Activity     Outcome Measure Help from another person eating meals?: None Help from another person taking care of personal grooming?: A Little Help from another person toileting, which includes using toliet, bedpan, or urinal?: A Little Help from another person bathing (including washing, rinsing, drying)?: A Little Help from another person to put on and taking off regular upper body clothing?: A Little Help from another person to put on and taking off regular lower body clothing?: A Little 6 Click Score: 19   End of Session Equipment Utilized During Treatment: Rolling walker Nurse Communication: Mobility status  Activity Tolerance: Patient tolerated treatment well Patient left: in chair;with call bell/phone within reach  OT Visit Diagnosis: Other abnormalities of gait and mobility (R26.89);Muscle weakness (generalized) (M62.81)                Time: 3646-8032 OT  Time Calculation (min): 17 min Charges:  OT General Charges $OT Visit: 1 Visit OT Evaluation $OT Eval Moderate Complexity: 1 Mod  Barry Brunner, OT Acute Rehabilitation Services Pager [phone] Office [phone]   Chancy Milroy 09/18/2019, 1:42 PM

## 2019-09-18 NOTE — Anesthesia Postprocedure Evaluation (Signed)
Anesthesia Post Note  Patient: Mark Fernandez  Procedure(s) Performed: TRANSESOPHAGEAL ECHOCARDIOGRAM (TEE) (N/A )     Patient location during evaluation: PACU Anesthesia Type: MAC Level of consciousness: awake and alert Pain management: pain level controlled Vital Signs Assessment: post-procedure vital signs reviewed and stable Respiratory status: spontaneous breathing Cardiovascular status: stable Anesthetic complications: no    Last Vitals:  Vitals:   09/18/19 1244 09/18/19 1742  BP: 109/72 108/62  Pulse: 80 87  Resp: 18 18  Temp: 36.4 C 36.4 C  SpO2: 100% 99%    Last Pain:  Vitals:   09/18/19 1930  TempSrc:   PainSc: 0-No pain                 Nolon Nations

## 2019-09-18 NOTE — Progress Notes (Addendum)
PROGRESS NOTE    Mark Fernandez  BJY:[id number] DOB: 15-Aug-1950 DOA: 09/14/2019 PCP: Jarome Matin, MD    Brief Narrative:  Patient was admitted to the hospital with working diagnosis of atypical chest pain, in the setting of acute kidney injury chronic kidney disease.  69 year old male who presented with chest pain. He does have significant past medical history for hypertension, dyslipidemia, coronary disease, status post bypass grafting in 2007, angioplasty 2020, type 2 diabetes mellitus and systolic heart failure. Patient reported substernal chest pain,pressure in nature.On his initial physical examination, 107/65, heart rate 78, respiratory rate17, oxygen saturation 99%. Lungs are clear to auscultation bilaterally, he had a 3/6 systolic murmur the apex, abdomen soft, no lower extremity edema. Sodium 143, potassium 3.7, chloride 109, bicarb 21, glucose 72, BUN 23, creatinine 2.34, white count 6.1, hemoglobin 9.9, hematocrit 30.8, platelets 335.His chest radiograph had mild cardiomegaly, bilateral interstitial infiltrates, fluid in the fissures, andcephalization of vasculature. EKG 74 bpm, left axis deviation, left bundle branch block, manually corrected QTC 494,sinus rhythm, no ST segment or T wave changes.  Patient received furosemide for signs of volume overload and cardiorenal syndrome. Further workup with TEE showed severe mitral regurgitation.  Patient now started on aggressive diuresis with furosemide and heart failure management.     Assessment & Plan:   Principal Problem:   Acute on chronic systolic CHF (congestive heart failure) (HCC) Active Problems:   Coronary artery disease   Diabetes (HCC)   Acute kidney injury superimposed on chronic kidney disease (HCC)   Obesity (BMI 30.0-34.9)   Normocytic anemia   Dyspnea on minimal exertion   1. Systolic heart failure, with acute decompensation. Patient continue to have dyspnea on exertion, Sp CABG and PCI  ischemic cardiomyopathy, with mitral regurgitation. Manually corrected Qtc is 494 (note patient has LBBB).   Patient continue to be symptomatic, blood pressure systolic 109 mmHG  TEE with reduced LV systolic function and severe MR. Will continue aggressive diuresis with furosemide to target negative fluid balance, will follow on renal function and urine output.   Continue with aspirin and statin.  Patient continue to be at high risk for worsening heart failure and renal failure.  2. AKI on ckd stage 2/ hypernatremia/ cardiorenal syndrome. Renal function with serum cr stable at 1,74 with K at 3,8 and serum bicarbonate at 20. Continue diuresis with furosemide.    3. T2DM/ dyslipidemia. Fasting glucose this am 148, will continue gucose cover and monitoring with insulin sliding, and basal insulin 18 units.  4. Anemia/ multifactorial. Follow cell count as needed.  5. Obesity class 2. Calculated BMI is 33, will need follow up as outpatient.   DVT prophylaxis:enoxaparin Code Status:full Family Communication:no family at the bedside Disposition Plan/ discharge barriers:patient from home, barrier for dc decompensated ischemic cardiomyopathy, needing further cardiac workup.    Consultants:   Cardiology     Subjective: Patient continue to have dyspnea, worse on exertion, no nausea or vomiting no chest pain.   Objective: Vitals:   09/18/19 0845 09/18/19 0855 09/18/19 0900 09/18/19 0954  BP: 93/61 (!) 100/55 91/62 113/66  Pulse: 75 71 73 78  Resp: 19 (!) 26 18   Temp:      TempSrc:      SpO2: 100% 100% 100%   Weight:      Height:        Intake/Output Summary (Last 24 hours) at 09/18/2019 1106 Last data filed at 09/18/2019 0826 Gross per 24 hour  Intake 1000 ml  Output 250  ml  Net 750 ml   Filed Weights   09/15/19 2330 09/17/19 0517 09/18/19 0743  Weight: 88.3 kg 89.9 kg 89.9 kg    Examination:   General: Not in pain or dyspnea, deconditioned  Neurology:  Awake and alert, non focal  E ENT: mild  pallor, no icterus, oral mucosa moist Cardiovascular: positive JVD. S1-S2 present, rhythmic, no gallops, rubs. Systolic murmur at the apex 3/6, with no radiationTrace lower extremity edema. Pulmonary: positive  breath sounds bilaterally, decreased air movement, no wheezing, rhonchi or rales. Gastrointestinal. Abdomen with no organomegaly, non tender, no rebound or guarding Skin. No rashes Musculoskeletal: no joint deformities     Data Reviewed: I have personally reviewed following labs and imaging studies  CBC: Recent Labs  Lab 09/14/19 0129 09/15/19 0455 09/16/19 0845  WBC 6.3 6.1 5.4  HGB 10.3* 9.9* 9.5*  HCT 32.3* 30.8* 29.1*  MCV 90.0 89.0 90.4  PLT 356 335 517   Basic Metabolic Panel: Recent Labs  Lab 09/14/19 0129 09/15/19 0455 09/16/19 0845 09/17/19 0238 09/18/19 0509  NA 143 146* 139 142 142  K 3.7 3.9 4.2 4.1 3.8  CL 109 113* 111 112* 110  CO2 21* 23 20* 21* 20*  GLUCOSE 72 70 282* 223* 146*  BUN 23 28* 28* 29* 34*  CREATININE 2.34* 2.05* 1.64* 1.78* 1.74*  CALCIUM 9.3 9.0 8.7* 8.9 8.9   GFR: Estimated Creatinine Clearance: 40 mL/min (A) (by C-G formula based on SCr of 1.74 mg/dL (H)). Liver Function Tests: No results for input(s): AST, ALT, ALKPHOS, BILITOT, PROT, ALBUMIN in the last 168 hours. No results for input(s): LIPASE, AMYLASE in the last 168 hours. No results for input(s): AMMONIA in the last 168 hours. Coagulation Profile: No results for input(s): INR, PROTIME in the last 168 hours. Cardiac Enzymes: No results for input(s): CKTOTAL, CKMB, CKMBINDEX, TROPONINI in the last 168 hours. BNP (last 3 results) No results for input(s): PROBNP in the last 8760 hours. HbA1C: No results for input(s): HGBA1C in the last 72 hours. CBG: Recent Labs  Lab 09/17/19 1115 09/17/19 1616 09/17/19 2119 09/18/19 0627 09/18/19 1047  GLUCAP 223* 169* 162* 121* 148*   Lipid Profile: No results for input(s): CHOL,  HDL, LDLCALC, TRIG, CHOLHDL, LDLDIRECT in the last 72 hours. Thyroid Function Tests: No results for input(s): TSH, T4TOTAL, FREET4, T3FREE, THYROIDAB in the last 72 hours. Anemia Panel: No results for input(s): VITAMINB12, FOLATE, FERRITIN, TIBC, IRON, RETICCTPCT in the last 72 hours.    Radiology Studies: I have reviewed all of the imaging during this hospital visit personally     Scheduled Meds: . aspirin EC  81 mg Oral Daily  . atorvastatin  80 mg Oral q1800  . fluticasone furoate-vilanterol  1 puff Inhalation Daily   And  . umeclidinium bromide  1 puff Inhalation Daily  . heparin  5,000 Units Subcutaneous Q8H  . insulin aspart  0-15 Units Subcutaneous TID WC  . insulin glargine  18 Units Subcutaneous QHS  . loratadine  10 mg Oral Daily  . metoprolol succinate  25 mg Oral Daily  . prasugrel  10 mg Oral Daily  . tamsulosin  0.4 mg Oral QPC supper   Continuous Infusions:   LOS: 3 days        Shakenna Herrero Gerome Apley, MD

## 2019-09-18 NOTE — CV Procedure (Signed)
TRANSESOPHAGEAL ECHOCARDIOGRAM (TEE) NOTE  INDICATIONS: mitral regurgitation  PROCEDURE:   Informed consent was obtained prior to the procedure. The risks, benefits and alternatives for the procedure were discussed and the patient comprehended these risks.  Risks include, but are not limited to, cough, sore throat, vomiting, nausea, somnolence, esophageal and stomach trauma or perforation, bleeding, low blood pressure, aspiration, pneumonia, infection, trauma to the teeth and death.    After a procedural time-out, the patient was given propofol per anesthesia for sedation.  The patient's heart rate, blood pressure, and oxygen saturation are monitored continuously during the procedure. The transesophageal probe was inserted in the esophagus and stomach without difficulty and multiple views were obtained.  The patient was kept under observation until the patient left the procedure room.  The patient left the procedure room in stable condition.   Agitated microbubble saline contrast was not administered.  COMPLICATIONS:    There were no immediate complications.  Findings:  1. LEFT VENTRICLE: The left ventricular wall thickness is moderately increased.  The left ventricular cavity is dilated in size. Wall motion is severely hypokinetic with incoordinate septal motion.  LVEF is 25-30%.  2. RIGHT VENTRICLE:  The right ventricle is mild to moderately hypokinetic without any thrombus or masses.    3. LEFT ATRIUM:  The left atrium is mildly dilated in size without any thrombus or masses.  There is spontaneous echo contrast ("smoke") in the left atrium consistent with a low flow state.  4. LEFT ATRIAL APPENDAGE:  The large windsock left atrial appendage is free of any thrombus or masses. The appendage has single lobes. Pulse doppler indicates moderate flow in the appendage.  5. ATRIAL SEPTUM:  The atrial septum appears intact and is free of thrombus and/or masses.  There is no evidence for  interatrial shunting by color doppler.  6. RIGHT ATRIUM:  The right atrium is normal in size and function without any thrombus or masses.  7. MITRAL VALVE:  The mitral valve is mildly thickened with mild mitral annualr calcification. There is Severe functional regurgitation with multiple central jets. With respect to the largest jet, MR rad 0.8. ERO 0.3 cm2, Rvol 49 ml. There were no vegetations or stenosis.  8. AORTIC VALVE:  The aortic valve is trileaflet, normal in structure and function with no regurgitation.  There were no vegetations or stenosis  9. TRICUSPID VALVE:  The tricuspid valve is normal in structure and function with Moderate regurgitation.  There were no vegetations or stenosis  10.  PULMONIC VALVE:  The pulmonic valve is normal in structure and function with trivial regurgitation.  There were no vegetations or stenosis.   11. AORTIC ARCH, ASCENDING AND DESCENDING AORTA:  There was grade 1 Ron Parker et. Al, 1992) atherosclerosis of the ascending aorta, aortic arch, or proximal descending aorta.  12. PULMONARY VEINS: Anomalous pulmonary venous return was not noted. Reversal of flow in the LUPV was noted, consistent with severe MR.  13. PERICARDIUM: The pericardium appeared normal and non-thickened.  There is no pericardial effusion.  IMPRESSION:   1. Severe functional MR 2. No LAA thrombus 3. Negative for PFO by color doppler 4. Moderate TR 5. Mild LAE  6. LVEF 25-30% with global hypokinesis 7. Mild to moderate RV hypokinesis  RECOMMENDATIONS:    1. Further work-up per primary cardiology service as to etiology of cardiomyopathy. Severity of MR likely to improve with diuresis and improved LV function.  Time Spent Directly with the Patient:  45 minutes   Chrissie Noa  C. Rennis Golden, MD, [facility], FACP  [place]  [facility]  Medical Director of the [facility] Diplomate of the American Board of Clinical  Lipidology Attending Cardiologist  Direct Dial: [phone]  Fax: [phone]  Website:  [email or web address] Nicely Sonam Huelsmann 09/18/2019, 8:32 AM

## 2019-09-18 NOTE — Evaluation (Signed)
Physical Therapy Evaluation Patient Details Name: Mark Fernandez MRN: [id number] DOB: 22-Apr-1951 Today's Date: 09/18/2019   History of Present Illness  69 y.o. male with medical history significant of HTN, HLD, CAD s/p CABG in 2007 and s/p 2 DES in 11/2018, NSVT, DM type II, systolic CHF last LVEF 25-30% presents to ED on 3/25 with complaints of 2 days of chest pain and shortness of breath. TEE 3/29 reveals severe MR, moderate TR, LVEF 25-30%.  Clinical Impression   Pt presents with functional weakness, fair standing balance, decreased activity tolerance with dyspnea on exertion during short distance ambulation. Pt to benefit from acute PT to address deficits. Pt ambulated 35 + 25 ft with use of min guard and cane, pt limited mostly by dyspnea on exertion and poor tolerance for activity cardiovascularly. Pt lives alone, but has intermittent assist from his friends who live closeby. PT recommending HHPT to address mobility deficits, and HH aide to provide assist with daily activities given pt's limited tolerance for activity at present. PT to progress mobility as tolerated, and will continue to follow acutely.    Pt with moderate amount of bleeding from recently removed IV site during hallway ambulation, RN notified and dressed site accordingly.    Follow Up Recommendations Home health PT;Supervision - Intermittent(HH aide)    Equipment Recommendations  Cane    Recommendations for Other Services       Precautions / Restrictions Precautions Precautions: Fall Restrictions Weight Bearing Restrictions: No      Mobility  Bed Mobility Overal bed mobility: Needs Assistance             General bed mobility comments: pt up in chair upon PT arrival, in recliner upon PT exit.  Transfers Overall transfer level: Needs assistance Equipment used: Straight cane Transfers: Sit to/from Stand Sit to Stand: Min guard         General transfer comment: min guard for safety, increased time  to rise and steady.  Ambulation/Gait Ambulation/Gait assistance: Min guard Gait Distance (Feet): 35 Feet Assistive device: Straight cane Gait Pattern/deviations: Step-through pattern;Decreased stride length;Trunk flexed Gait velocity: decr   General Gait Details: min guard for safety, verbal cuing for sequencing with cane and proper cane height. Pt required two 1-minute standing rest breaks, one at 35 ft ambulation at 60 ft ambulation. DOE 2/4, no chest pain reported  Information systems manager [place] (Stroke Patients Only)       Balance Overall balance assessment: Needs assistance Sitting-balance support: No upper extremity supported Sitting balance-Leahy Scale: Good Sitting balance - Comments: able to sit edge of recliner with no UE support   Standing balance support: Single extremity supported;During functional activity Standing balance-Leahy Scale: Fair Standing balance comment: requires single limb support dynamically                             Pertinent Vitals/Pain Pain Assessment: No/denies pain    Home Living Family/patient expects to be discharged to:: Private residence Living Arrangements: Alone Available Help at Discharge: Family;Friend(s);Available PRN/intermittently(friend Cristy Friedlander, other friend who works in a nursing home) Type of Home: House Home Access: Stairs to enter Entrance Stairs-Rails: Acupuncturist of Steps: 2 Home Layout: One level Home Equipment: Environmental consultant - 2 wheels;Bedside commode Additional Comments: RW is in [place]    Prior Function Level of Independence: Independent with assistive device(s)  Comments: Lost his cane on the 23rd of March, otherwise was using cane for ambulation. Pt reports no falls in the past year, states he does his own ADLs.     Hand Dominance   Dominant Hand: Right    Extremity/Trunk Assessment   Upper Extremity Assessment Upper Extremity Assessment:  Defer to OT evaluation    Lower Extremity Assessment Lower Extremity Assessment: RLE deficits/detail;LLE deficits/detail RLE Deficits / Details: MMT: hip flexion 3+/5, knee extension 4/5, knee flexion 4/5. Strength symmetric LLE Deficits / Details: MMT: hip flexion 3+/5, knee extension 4/5, knee flexion 4/5. Strength symmetric    Cervical / Trunk Assessment Cervical / Trunk Assessment: Normal  Communication   Communication: No difficulties  Cognition Arousal/Alertness: Awake/alert Behavior During Therapy: WFL for tasks assessed/performed Overall Cognitive Status: Within Functional Limits for tasks assessed Area of Impairment: Following commands;Problem solving                       Following Commands: Follows multi-step commands with increased time(and repeated cuing)     Problem Solving: Slow processing;Requires verbal cues;Requires tactile cues General Comments: requires repeated cuing and command-giving at times, especially with multi-step commands.      General Comments General comments (skin integrity, edema, etc.): moderate amount of bleeding from recently removed IV site, RN notified and dressed site accordingly.    Exercises     Assessment/Plan    PT Assessment Patient needs continued PT services  PT Problem List Decreased strength;Decreased mobility;Decreased activity tolerance;Decreased balance;Decreased knowledge of use of DME;Cardiopulmonary status limiting activity;Obesity       PT Treatment Interventions DME instruction;Therapeutic activities;Gait training;Therapeutic exercise;Patient/family education;Balance training;Stair training;Functional mobility training;Neuromuscular re-education    PT Goals (Current goals can be found in the Care Plan section)  Acute Rehab PT Goals Patient Stated Goal: have more tolerance for activity PT Goal Formulation: With patient Time For Goal Achievement: 10/02/19 Potential to Achieve Goals: Good    Frequency Min  3X/week   Barriers to discharge Decreased caregiver support      Co-evaluation               AM-PAC PT "6 Clicks" Mobility  Outcome Measure Help needed turning from your back to your side while in a flat bed without using bedrails?: A Little Help needed moving from lying on your back to sitting on the side of a flat bed without using bedrails?: A Little Help needed moving to and from a bed to a chair (including a wheelchair)?: A Little Help needed standing up from a chair using your arms (e.g., wheelchair or bedside chair)?: A Little Help needed to walk in hospital room?: A Little Help needed climbing 3-5 steps with a railing? : A Lot 6 Click Score: 17    End of Session   Activity Tolerance: Patient limited by fatigue Patient left: in chair;with call bell/phone within reach;with nursing/sitter in room Nurse Communication: Mobility status PT Visit Diagnosis: Difficulty in walking, not elsewhere classified (R26.2);Other abnormalities of gait and mobility (R26.89)    Time: 0935-1015(not charging for 15 minutes of this due to bleeding issues, looking for cane) PT Time Calculation (min) (ACUTE ONLY): 40 min   Charges:   PT Evaluation $PT Eval Low Complexity: 1 Low PT Treatments $Gait Training: 8-22 mins        Lavaughn Haberle E, PT Acute Rehabilitation Services Pager [phone]  Office [phone]   Derryl Uher D Cloyce Paterson 09/18/2019, 11:19 AM

## 2019-09-18 NOTE — Progress Notes (Signed)
Cardiology Progress Note  Patient ID: Mark Fernandez MRN: [id number] DOB: 09/17/1950 Date of Encounter: 09/18/2019  Primary Cardiologist: Rollene RotundaJames Hochrein, MD  Subjective  Grossly volume overloaded on examination.  Transesophageal echo with severe MR.  This is just related to volume.  Reports he still short of breath when he exerts himself.  2+ lower extremity edema, elevated neck veins.  BNP 900.   ROS:  All other ROS reviewed and negative. Pertinent positives noted in the HPI.     Inpatient Medications  Scheduled Meds: . aspirin EC  81 mg Oral Daily  . atorvastatin  80 mg Oral q1800  . fluticasone furoate-vilanterol  1 puff Inhalation Daily   And  . umeclidinium bromide  1 puff Inhalation Daily  . heparin  5,000 Units Subcutaneous Q8H  . insulin aspart  0-15 Units Subcutaneous TID WC  . insulin glargine  18 Units Subcutaneous QHS  . isosorbide-hydrALAZINE  1 tablet Oral TID  . loratadine  10 mg Oral Daily  . metoprolol succinate  25 mg Oral Daily  . potassium chloride  40 mEq Oral Once  . prasugrel  10 mg Oral Daily  . tamsulosin  0.4 mg Oral QPC supper   Continuous Infusions: . furosemide     PRN Meds: acetaminophen, albuterol, alum & mag hydroxide-simeth, ondansetron (ZOFRAN) IV   Vital Signs   Vitals:   09/18/19 0845 09/18/19 0855 09/18/19 0900 09/18/19 0954  BP: 93/61 (!) 100/55 91/62 113/66  Pulse: 75 71 73 78  Resp: 19 (!) 26 18   Temp:      TempSrc:      SpO2: 100% 100% 100%   Weight:      Height:        Intake/Output Summary (Last 24 hours) at 09/18/2019 1142 Last data filed at 09/18/2019 0826 Gross per 24 hour  Intake 1000 ml  Output 250 ml  Net 750 ml   Last 3 Weights 09/18/2019 09/17/2019 09/15/2019  Weight (lbs) 198 lb 4.8 oz 198 lb 4.8 oz 194 lb 10.7 oz  Weight (kg) 89.948 kg 89.948 kg 88.3 kg      Telemetry  Overnight telemetry shows sinus bradycardia, which I personally reviewed.   ECG  The most recent ECG shows normal sinus rhythm, heart  rate 93, left bundle branch block with QRS around 160, which I personally reviewed.   Physical Exam   Vitals:   09/18/19 0845 09/18/19 0855 09/18/19 0900 09/18/19 0954  BP: 93/61 (!) 100/55 91/62 113/66  Pulse: 75 71 73 78  Resp: 19 (!) 26 18   Temp:      TempSrc:      SpO2: 100% 100% 100%   Weight:      Height:         Intake/Output Summary (Last 24 hours) at 09/18/2019 1142 Last data filed at 09/18/2019 0826 Gross per 24 hour  Intake 1000 ml  Output 250 ml  Net 750 ml    Last 3 Weights 09/18/2019 09/17/2019 09/15/2019  Weight (lbs) 198 lb 4.8 oz 198 lb 4.8 oz 194 lb 10.7 oz  Weight (kg) 89.948 kg 89.948 kg 88.3 kg    Body mass index is 34.04 kg/m.   General: Well nourished, well developed, in no acute distress Head: Atraumatic, normal size  Eyes: PEERLA, EOMI  Neck: Supple, JVD up to earlobes Endocrine: No thryomegaly Cardiac: Normal S1, S2; RRR; no murmurs, rubs, or gallops Lungs: Clear to auscultation bilaterally, no wheezing, rhonchi or rales  Abd: Soft, nontender, no  hepatomegaly  Ext: No edema, pulses 2+ Musculoskeletal: No deformities, BUE and BLE strength normal and equal Skin: Warm and dry, no rashes   Neuro: Alert and oriented to person, place, time, and situation, CNII-XII grossly intact, no focal deficits  Psych: Normal mood and affect   Labs  High Sensitivity Troponin:   Recent Labs  Lab 09/14/19 0129 09/14/19 0415  TROPONINIHS 138* 142*     Cardiac EnzymesNo results for input(s): TROPONINI in the last 168 hours. No results for input(s): TROPIPOC in the last 168 hours.  Chemistry Recent Labs  Lab 09/16/19 0845 09/17/19 0238 09/18/19 0509  NA 139 142 142  K 4.2 4.1 3.8  CL 111 112* 110  CO2 20* 21* 20*  GLUCOSE 282* 223* 146*  BUN 28* 29* 34*  CREATININE 1.64* 1.78* 1.74*  CALCIUM 8.7* 8.9 8.9  GFRNONAA 42* 38* 39*  GFRAA 48* 44* 45*  ANIONGAP 8 9 12     Hematology Recent Labs  Lab 09/14/19 0129 09/15/19 0455 09/16/19 0845  WBC 6.3  6.1 5.4  RBC 3.59* 3.46* 3.22*  HGB 10.3* 9.9* 9.5*  HCT 32.3* 30.8* 29.1*  MCV 90.0 89.0 90.4  MCH 28.7 28.6 29.5  MCHC 31.9 32.1 32.6  RDW 15.1 15.7* 15.4  PLT 356 335 309   BNP Recent Labs  Lab 09/18/19 0733  BNP 945.0*    DDimer No results for input(s): DDIMER in the last 168 hours.   Radiology  DG Chest 1 View  Result Date: 09/17/2019 CLINICAL DATA:  Respiratory distress. EXAM: CHEST  1 VIEW COMPARISON:  Chest radiograph 09/14/2019 FINDINGS: Monitoring leads overlie the patient. Stable cardiomegaly status post median sternotomy and CABG procedure. No consolidative pulmonary opacities. No pleural effusion or pneumothorax. IMPRESSION: Cardiomegaly. No acute cardiopulmonary process. Electronically Signed   By: 09/16/2019 M.D.   On: 09/17/2019 14:34   ECHO TEE  Result Date: 09/18/2019    TRANSESOPHOGEAL ECHO REPORT   Patient Name:   Mark Fernandez Date of Exam: 09/18/2019 Medical Rec #:  09/20/2019       Height:       64.0 in Accession #:    [id number]      Weight:       198.3 lb Date of Birth:  [birth year]       BSA:          1.949 m Patient Age:    68 years        BP:           125/68 mmHg Patient Gender: M               HR:           74 bpm. Exam Location:  Inpatient Procedure: Transesophageal Echo, Color Doppler and Cardiac Doppler Indications:     mitral insufficiency 201046  History:         Patient has prior history of Echocardiogram examinations, most                  recent 09/14/2019. Cardiomyopathy, CAD, Prior CABG; Risk                  Factors:Hypertension and Diabetes.  Sonographer:     09/16/2019 RDCS (AE) Referring Phys:  Thurman Coyer [id number] FURTH Diagnosing Phys: Francee Nodal MD PROCEDURE: After discussion of the risks and benefits of a TEE, an informed consent was obtained from the patient. The transesophogeal probe was passed without difficulty through the esophogus of the patient.  Imaged were obtained with the patient in a supine position. Sedation performed by  different physician. The patient was monitored while under deep sedation. Anesthestetic sedation was provided intravenously by Anesthesiology: 195mg  of Propofol. Image quality was excellent. The patient's vital signs; including heart rate, blood pressure, and oxygen saturation; remained stable throughout the procedure. The patient developed no complications during the procedure. IMPRESSIONS  1. Left ventricular ejection fraction, by estimation, is 25 to 30%. The left ventricle has severely decreased function. The left ventricle demonstrates global hypokinesis. The left ventricular internal cavity size was mildly dilated. There is moderate left ventricular hypertrophy.  2. Right ventricular systolic function mild to moderately reduced. The right ventricular size is normal.  3. Left atrial size was mildly dilated. No left atrial/left atrial appendage thrombus was detected.  4. Mild mitral annular dilatation.  5. The mitral valve is abnormal. Severe mitral valve regurgitation.  6. The tricuspid valve is abnormal. Tricuspid valve regurgitation is moderate.  7. The aortic valve is tricuspid. Aortic valve regurgitation is not visualized. FINDINGS  Left Ventricle: Left ventricular ejection fraction, by estimation, is 25 to 30%. The left ventricle has severely decreased function. The left ventricle demonstrates global hypokinesis. The left ventricular internal cavity size was mildly dilated. There is moderate left ventricular hypertrophy. Abnormal (paradoxical) septal motion, consistent with left bundle branch block. Right Ventricle: The right ventricular size is normal. No increase in right ventricular wall thickness. Right ventricular systolic function mild to moderately reduced. Left Atrium: Left atrial size was mildly dilated. Spontaneous echo contrast was present in the left atrium. No left atrial/left atrial appendage thrombus was detected. Right Atrium: Right atrial size was normal in size. Pericardium: There is no  evidence of pericardial effusion. Mitral Valve: The mitral valve is abnormal. There is mild thickening of the mitral valve leaflet(s). Mild mitral annular calcification. Mild mitral annular dilatation. Severe mitral valve regurgitation, with centrally-directed jet. Pulmonary venous flow shows systolic flow reversal. Tricuspid Valve: The tricuspid valve is abnormal. Tricuspid valve regurgitation is moderate. RVSP is moderately elevated at 45 mmHg + RAP. Aortic Valve: The aortic valve is tricuspid. Aortic valve regurgitation is not visualized. Pulmonic Valve: The pulmonic valve was grossly normal. Pulmonic valve regurgitation is trivial. Aorta: The aortic root and ascending aorta are structurally normal, with no evidence of dilitation. Venous: The left upper pulmonary vein and left lower pulmonary vein are normal. A pattern of systolic flow reversal, suggestive of severe mitral regurgitation is recorded from the left upper pulmonary vein. IAS/Shunts: No atrial level shunt detected by color flow Doppler.  MR Peak grad:    176.4 mmHg  TRICUSPID VALVE MR Mean grad:    118.0 mmHg  TR Peak grad:   44.6 mmHg MR Vmax:         664.00 cm/s TR Vmax:        334.00 cm/s MR Vmean:        505.0 cm/s MR PISA:         4.36 cm MR PISA Eff ROA: 25 mm MR PISA Radius:  0.83 cm Lyman Bishop MD Electronically signed by Lyman Bishop MD Signature Date/Time: 09/18/2019/11:04:24 AM    Final     Cardiac Studies  TTE 09/14/2019 1. Global hypokinesis; septal dyssynergy; overall moderate to severe LV  dysfunction; grade 2 diastolic dysfunction; severe MR; mild LAE.  2. Left ventricular ejection fraction, by estimation, is 30 to 35%. The  left ventricle has moderate to severely decreased function. The left  ventricle demonstrates global hypokinesis. The  left ventricular internal  cavity size was mildly dilated. Left  ventricular diastolic parameters are consistent with Grade II diastolic  dysfunction (pseudonormalization). Elevated  left atrial pressure.  3. Right ventricular systolic function is normal. The right ventricular  size is normal. There is moderately elevated pulmonary artery systolic  pressure.  4. Left atrial size was mildly dilated.  5. The mitral valve is normal in structure. Severe mitral valve  regurgitation. No evidence of mitral stenosis.  6. The aortic valve is tricuspid. Aortic valve regurgitation is not  visualized. Mild aortic valve sclerosis is present, with no evidence of  aortic valve stenosis.  7. The inferior vena cava is normal in size with greater than 50%  respiratory variability, suggesting right atrial pressure of 3 mmHg.   Left heart cath [facility] 11/30/18: Severe 90% mid Lcx and 95% distal Lcx stenosis-culprit vessel s/p DES x 2: Mid: 2.5 x 12 DES Resolute Onyx, Distal: 2.0 x 8 mm DES Resolute Onyx Patent LIMA-LAD Patent SVG-Om1 Patent SVG-RCA Total occlusion of native mid LAD and mid RCA  Recommendations:  Continue DAPT with aspirin and ticagrelor Continue CAD medical therapy  Patient Profile   69 y.o. male with hx of CAD s/p CABG (LIMA-LAD, SVG-D1, SVG-OM, SVG-RCA in 2007), EF 45%, SVT, DM, HTN, HLD, former smoker, intolerant to statins who was admitted 09/14/2019 with CP and SOB. Found to be in acute decompensated HF.   Assessment & Plan   1.  Acute decompensated systolic heart failure, EF 30-35% -EF now 30-35% with severe MR.  BNP 900.  He is grossly volume overloaded on my examination. -Entresto held in the setting of AKI -I will start with 160 mg IV Lasix.  We will give him 40 mEq potassium prior to that dose.  He has JVD up to his earlobes.  Apparently was not diuresed at all over the weekend. -We will continue metoprolol succinate 25 mg daily.  He is warm and wet on examination.  No impending signs of cardiogenic shock. -I will start BiDil 20-30 7.5 3 times daily for afterload reduction -EKG with new left bundle and QRS 160.  This is creating significant  dyssynchrony resulting in worsening MR and overall worsening heart failure.  His EF was low a year ago.  He will need to be evaluated for a biventricular ICD.  This will be done as an outpatient. -I do agree he needs a left and right heart catheterization.  Now is not the time.  He is to volume overloaded.  I will start to diurese him and then we will plan for this in the next few days.  2.  Dizziness hypotension -Unclear etiology.  Possibly overmedicated. -I do worry about worsening heart failure and progression of his cardiomyopathy.  We will have to see how it goes with medications as above and diuresis.  3.  AKI -Related to hypotension.  Should improve with further diuresis.  We will continue to monitor this. -I am holding his home Entresto and Aldactone for now.  We will give him hydralazine isosorbide dinitrate for afterload reduction in the interim.  4.  CAD status post CABG and recent PCI -Most recent heart cath in June 2020 at [facility] showed patent LIMA to LAD, patent vein graft OM1, patent vein graft RCA.  He did undergo PCI to the mid and distal left circumflex. -He will remain on aspirin and prasugrel for now. -He is on high intensity statin.  5.  Severe MR -Related to decompensated systolic heart failure and  volume overload.  Regurgitant lesions are always worse in the setting of volume overload.  This is not a concern at this time.  I suspect he will ultimately need a biventricular pacer to shorten his QRS given his left bundle.  The MR often improves with that as well.  6.  Left bundle branch block, QRS 160 -This is new and is dyssynchrony is likely worsening his LV function.  He still will need a left and right heart catheterization but now not a time.  We will get him as dry as possible and then have him evaluated for a BiV ICD as an outpatient.  For questions or updates, please contact [facility] Please consult [email or web address] for contact info under   Time Spent with  Patient: I have spent a total of 35 minutes with patient reviewing hospital notes, telemetry, EKGs, labs and examining the patient as well as establishing an assessment and plan that was discussed with the patient.  > 50% of time was spent in direct patient care.    Signed, Lenna Gilford. Flora Lipps, MD [facility]  [facility]  09/18/2019 11:42 AM

## 2019-09-19 DIAGNOSIS — R0600 Dyspnea, unspecified: Secondary | ICD-10-CM

## 2019-09-19 DIAGNOSIS — I5023 Acute on chronic systolic (congestive) heart failure: Secondary | ICD-10-CM

## 2019-09-19 LAB — BASIC METABOLIC PANEL
Anion gap: 10 (ref 5–15)
BUN: 34 mg/dL — ABNORMAL HIGH (ref 8–23)
CO2: 22 mmol/L (ref 22–32)
Calcium: 8.8 mg/dL — ABNORMAL LOW (ref 8.9–10.3)
Chloride: 112 mmol/L — ABNORMAL HIGH (ref 98–111)
Creatinine, Ser: 1.78 mg/dL — ABNORMAL HIGH (ref 0.61–1.24)
GFR calc Af Amer: 44 mL/min — ABNORMAL LOW (ref >60)
GFR calc non Af Amer: 38 mL/min — ABNORMAL LOW (ref >60)
Glucose, Bld: 133 mg/dL — ABNORMAL HIGH (ref 70–99)
Potassium: 3.9 mmol/L (ref 3.5–5.1)
Sodium: 144 mmol/L (ref 135–145)

## 2019-09-19 LAB — GLUCOSE, CAPILLARY
Glucose-Capillary: 125 mg/dL — ABNORMAL HIGH (ref 70–99)
Glucose-Capillary: 133 mg/dL — ABNORMAL HIGH (ref 70–99)
Glucose-Capillary: 113 mg/dL — ABNORMAL HIGH (ref 70–99)
Glucose-Capillary: 144 mg/dL — ABNORMAL HIGH (ref 70–99)
Glucose-Capillary: 197 mg/dL — ABNORMAL HIGH (ref 70–99)

## 2019-09-19 LAB — MAGNESIUM: Magnesium: 2 mg/dL (ref 1.7–2.4)

## 2019-09-19 MED ORDER — METOLAZONE 5 MG PO TABS
5.0000 mg | ORAL_TABLET | Freq: Once | ORAL | Status: AC
  Administered 2019-09-19: 5 mg via ORAL
  Filled 2019-09-19: qty 1

## 2019-09-19 MED ORDER — FUROSEMIDE 10 MG/ML IJ SOLN
160.0000 mg | Freq: Two times a day (BID) | INTRAVENOUS | Status: AC
  Administered 2019-09-19 (×2): 160 mg via INTRAVENOUS
  Filled 2019-09-19 (×3): qty 16

## 2019-09-19 MED ORDER — METOPROLOL SUCCINATE ER 50 MG PO TB24
50.0000 mg | ORAL_TABLET | Freq: Every day | ORAL | Status: DC
  Administered 2019-09-20 – 2019-09-22 (×3): 50 mg via ORAL
  Filled 2019-09-19 (×3): qty 1

## 2019-09-19 MED ORDER — POTASSIUM CHLORIDE CRYS ER 20 MEQ PO TBCR
40.0000 meq | EXTENDED_RELEASE_TABLET | Freq: Two times a day (BID) | ORAL | Status: DC
  Administered 2019-09-19 – 2019-09-22 (×7): 40 meq via ORAL
  Filled 2019-09-19 (×7): qty 2

## 2019-09-19 NOTE — Progress Notes (Signed)
Physical Therapy Treatment Patient Details Name: Mark Fernandez MRN: [id number] DOB: 1951-02-26 Today's Date: 09/19/2019    History of Present Illness 69 y.o. male with medical history significant of HTN, HLD, CAD s/p CABG in 2007 and s/p 2 DES in 11/2018, NSVT, DM type II, systolic CHF last LVEF 84-16% presents to ED on 3/25 with complaints of 2 days of chest pain and shortness of breath. TEE 3/29 reveals severe MR, moderate TR, LVEF 25-30%.    PT Comments    Pt received in recliner. Declining hallway ambulation but agreeable to mobilize in room. He required min guard assist transfers and ambulation. 2/4 DOE noted with minimal activity. Pt would benefit from rollator at home to maximize mobility and independence. The seat on the rollator would allow him to take seated rest breaks since he fatigues quickly.    Follow Up Recommendations  Home health PT;Supervision - Intermittent;Other (comment)(HH aide)     Equipment Recommendations  Other (comment)(rollator)    Recommendations for Other Services       Precautions / Restrictions Precautions Precautions: Fall    Mobility  Bed Mobility               General bed mobility comments: up in recliner upon entry   Transfers Overall transfer level: Needs assistance Equipment used: 1 person hand held assist Transfers: Sit to/from Stand Sit to Stand: Min guard         General transfer comment: min guard for safety and balance  Ambulation/Gait Ambulation/Gait assistance: Min guard Gait Distance (Feet): 35 Feet Assistive device: 1 person hand held assist Gait Pattern/deviations: Step-through pattern;Decreased stride length Gait velocity: decreased Gait velocity interpretation: <1.31 ft/sec, indicative of household ambulator General Gait Details: 2/4 DOE noted. SpO2 98% on RA. Pt declining hallway ambulation.   Stairs             Wheelchair Mobility    Modified Rankin (Stroke Patients Only)       Balance  Overall balance assessment: Needs assistance Sitting-balance support: No upper extremity supported;Feet supported Sitting balance-Leahy Scale: Good Sitting balance - Comments: supervision    Standing balance support: Single extremity supported;During functional activity;No upper extremity supported Standing balance-Leahy Scale: Fair Standing balance comment: dynamically requires UE support                            Cognition Arousal/Alertness: Awake/alert Behavior During Therapy: WFL for tasks assessed/performed Overall Cognitive Status: Within Functional Limits for tasks assessed                                        Exercises      General Comments        Pertinent Vitals/Pain Pain Assessment: No/denies pain    Home Living                      Prior Function            PT Goals (current goals can now be found in the care plan section) Acute Rehab PT Goals Patient Stated Goal: have more tolerance for activity Progress towards PT goals: Progressing toward goals    Frequency    Min 3X/week      PT Plan Equipment recommendations need to be updated    Co-evaluation  AM-PAC PT "6 Clicks" Mobility   Outcome Measure  Help needed turning from your back to your side while in a flat bed without using bedrails?: A Little Help needed moving from lying on your back to sitting on the side of a flat bed without using bedrails?: A Little Help needed moving to and from a bed to a chair (including a wheelchair)?: A Little Help needed standing up from a chair using your arms (e.g., wheelchair or bedside chair)?: A Little Help needed to walk in hospital room?: A Little Help needed climbing 3-5 steps with a railing? : A Lot 6 Click Score: 17    End of Session   Activity Tolerance: Patient limited by fatigue Patient left: in chair;with call bell/phone within reach Nurse Communication: Mobility status PT Visit  Diagnosis: Difficulty in walking, not elsewhere classified (R26.2);Other abnormalities of gait and mobility (R26.89)     Time: 5625-6389 PT Time Calculation (min) (ACUTE ONLY): 13 min  Charges:  $Gait Training: 8-22 mins                     Aida Raider, PT  Office # [phone] Pager [phone]    Ilda Foil 09/19/2019, 12:34 PM

## 2019-09-19 NOTE — Progress Notes (Signed)
PROGRESS NOTE    Mark Fernandez  TGP:[id number] DOB: Nov 25, 1950 DOA: 09/14/2019 PCP: Jarome Matin, MD    Brief Narrative:  Patient was admitted to the hospital with working diagnosis of atypical chest pain, in the setting of acute on chronic systolic heart failure decompensation and acute kidney injury on chronic kidney disease.  69 year old male who presented with chest pain. He does have significant past medical history for hypertension, dyslipidemia, coronary disease, status post bypass grafting in 2007, angioplasty 2020, type 2 diabetes mellitus and systolic heart failure. Patient reported substernal chest pain,pressure in nature.On his initial physical examination, 107/65, heart rate 78, respiratory rate17, oxygen saturation 99%. Lungs are clear to auscultation bilaterally, he had a 3/6 systolic murmur the apex, abdomen soft, no lower extremity edema. Sodium 143, potassium 3.7, chloride 109, bicarb 21, glucose 72, BUN 23, creatinine 2.34, white count 6.1, hemoglobin 9.9, hematocrit 30.8, platelets 335.His chest radiograph had mild cardiomegaly, bilateral interstitial infiltrates, fluid in the fissures, andcephalization of vasculature. EKG 74 bpm, left axis deviation, left bundle branch block, manually corrected QTC 494,sinus rhythm, no ST segment or T wave changes.  Patientreceived furosemide for signs of volume overload and cardiorenal syndrome. Further workup with TEE showed severe mitral regurgitation.  Patient now started on more aggressive diuresis with furosemide and heart failure management.    Assessment & Plan:   Principal Problem:   Acute on chronic systolic CHF (congestive heart failure) (HCC) Active Problems:   Coronary artery disease   Diabetes (HCC)   Acute kidney injury superimposed on chronic kidney disease (HCC)   Obesity (BMI 30.0-34.9)   Normocytic anemia   Dyspnea on minimal exertion    1. Systolic heart failure, with acute  decompensation.Patientcontinue to [employer] on exertion, Sp CABG and PCI ischemic cardiomyopathy, with mitral regurgitation. Manually corrected Qtc is 494 (note patient has LBBB). TEE with reduced LV systolic function and severe MR. Urine output over last 24 H 350, blood pressure systolic 100 mmHg.   Increased diuresis with IV diuresis with furosemide 160 mg bid, continue to target negative fluid balance. Continue after load reduction with isosorbide and hydralazine. B blockade with metoprolol 50 mg   On aspirin and statin.  He continue to be at high risk for worsening heart failure and renal failure.  2. AKI on ckd stage 2/ hypernatremia/ cardiorenal syndrome. Renal function continue with stable serum cr stable at 1,7 with K at 3,9and serum bicarbonate at 22. Dose of furosemide has been increased, will continue to follow up with output and renal function.   3. T2DM/ dyslipidemia.Fasting glucose this am 112. On gucose cover and monitoring with insulin sliding, plus basal insulin 18 units. Patient is tolerating po well.   Continue with atorvastatin.   4. Anemia/ multifactorial. Hgb has been stable.   5. Obesity class 2. Calculated BMI is 33, outpatient follow up.   DVT prophylaxis:enoxaparin Code Status:full Family Communication:no family at the bedside Disposition Plan/ discharge barriers:patient from home, barrier for dc decompensated ischemic cardiomyopathy, needing further cardiac workup.   Consultants:   Cardiology   i  Subjective: Patient continue to have persistent dyspnea, no nausea or vomiting, no further chest pain. Dyspnea is worse with exertion. Today complains of lower extremity edema.   Objective: Vitals:   09/18/19 2350 09/19/19 0537 09/19/19 0812 09/19/19 0905  BP: (!) 94/53 (!) 118/58  108/63  Pulse: 86 79 82 82  Resp: 16 16 16    Temp: 98.6 F (37 C) 98 F (36.7 C)    TempSrc:  Oral Oral    SpO2: 99% 96% 99%   Weight:      Height:         Intake/Output Summary (Last 24 hours) at 09/19/2019 1250 Last data filed at 09/18/2019 1930 Gross per 24 hour  Intake 66 ml  Output 350 ml  Net -284 ml   Filed Weights   09/15/19 2330 09/17/19 0517 09/18/19 0743  Weight: 88.3 kg 89.9 kg 89.9 kg    Examination:   General: Not in pain or dyspnea, deconditioned  Neurology: Awake and alert, non focal  E ENT: no pallor, no icterus, oral mucosa moist Cardiovascular: mild JVD. S1-S2 present, rhythmic, no gallops, rubs, or murmurs. Trace lower extremity edema. Pulmonary: positive breath sounds bilaterally,  no wheezing, or rhonchi mild bilateral rales. Gastrointestinal. Abdomen with no organomegaly, non tender, no rebound or guarding Skin. No rashes Musculoskeletal: no joint deformities     Data Reviewed: I have personally reviewed following labs and imaging studies  CBC: Recent Labs  Lab 09/14/19 0129 09/15/19 0455 09/16/19 0845  WBC 6.3 6.1 5.4  HGB 10.3* 9.9* 9.5*  HCT 32.3* 30.8* 29.1*  MCV 90.0 89.0 90.4  PLT 356 335 309   Basic Metabolic Panel: Recent Labs  Lab 09/15/19 0455 09/16/19 0845 09/17/19 0238 09/18/19 0509 09/19/19 0620  NA 146* 139 142 142 144  K 3.9 4.2 4.1 3.8 3.9  CL 113* 111 112* 110 112*  CO2 23 20* 21* 20* 22  GLUCOSE 70 282* 223* 146* 133*  BUN 28* 28* 29* 34* 34*  CREATININE 2.05* 1.64* 1.78* 1.74* 1.78*  CALCIUM 9.0 8.7* 8.9 8.9 8.8*  MG  --   --   --   --  2.0   GFR: Estimated Creatinine Clearance: 38.5 mL/min (A) (by C-G formula based on SCr of 1.78 mg/dL (H)). Liver Function Tests: No results for input(s): AST, ALT, ALKPHOS, BILITOT, PROT, ALBUMIN in the last 168 hours. No results for input(s): LIPASE, AMYLASE in the last 168 hours. No results for input(s): AMMONIA in the last 168 hours. Coagulation Profile: No results for input(s): INR, PROTIME in the last 168 hours. Cardiac Enzymes: No results for input(s): CKTOTAL, CKMB, CKMBINDEX, TROPONINI in the last 168 hours. BNP  (last 3 results) No results for input(s): PROBNP in the last 8760 hours. HbA1C: No results for input(s): HGBA1C in the last 72 hours. CBG: Recent Labs  Lab 09/18/19 1656 09/18/19 2158 09/19/19 0538 09/19/19 0904 09/19/19 1158  GLUCAP 199* 217* 125* 133* 113*   Lipid Profile: No results for input(s): CHOL, HDL, LDLCALC, TRIG, CHOLHDL, LDLDIRECT in the last 72 hours. Thyroid Function Tests: No results for input(s): TSH, T4TOTAL, FREET4, T3FREE, THYROIDAB in the last 72 hours. Anemia Panel: No results for input(s): VITAMINB12, FOLATE, FERRITIN, TIBC, IRON, RETICCTPCT in the last 72 hours.    Radiology Studies: I have reviewed all of the imaging during this hospital visit personally     Scheduled Meds: . aspirin EC  81 mg Oral Daily  . atorvastatin  80 mg Oral q1800  . fluticasone furoate-vilanterol  1 puff Inhalation Daily   And  . umeclidinium bromide  1 puff Inhalation Daily  . heparin  5,000 Units Subcutaneous Q8H  . insulin aspart  0-15 Units Subcutaneous TID WC  . insulin glargine  18 Units Subcutaneous QHS  . isosorbide-hydrALAZINE  1 tablet Oral TID  . loratadine  10 mg Oral Daily  . metolazone  5 mg Oral Once  . [START ON 09/20/2019] metoprolol succinate  50 mg Oral Daily  . potassium chloride  40 mEq Oral BID  . prasugrel  10 mg Oral Daily  . tamsulosin  0.4 mg Oral QPC supper   Continuous Infusions: . furosemide       LOS: 4 days        Orvis Stann Gerome Apley, MD

## 2019-09-19 NOTE — Progress Notes (Signed)
Progress Note  Patient Name: Mark Fernandez Date of Encounter: 09/19/2019  Primary Cardiologist: Rollene Rotunda, MD   Subjective   I/Os not complete. Patient denies chest pain. Says breathing is about the same.   Inpatient Medications    Scheduled Meds: . aspirin EC  81 mg Oral Daily  . atorvastatin  80 mg Oral q1800  . fluticasone furoate-vilanterol  1 puff Inhalation Daily   And  . umeclidinium bromide  1 puff Inhalation Daily  . heparin  5,000 Units Subcutaneous Q8H  . insulin aspart  0-15 Units Subcutaneous TID WC  . insulin glargine  18 Units Subcutaneous QHS  . isosorbide-hydrALAZINE  1 tablet Oral TID  . loratadine  10 mg Oral Daily  . [START ON 09/20/2019] metoprolol succinate  50 mg Oral Daily  . potassium chloride  40 mEq Oral BID  . prasugrel  10 mg Oral Daily  . tamsulosin  0.4 mg Oral QPC supper   Continuous Infusions: . furosemide     PRN Meds: acetaminophen, albuterol, alum & mag hydroxide-simeth, ondansetron (ZOFRAN) IV   Vital Signs    Vitals:   09/18/19 2350 09/19/19 0537 09/19/19 0812 09/19/19 0905  BP: (!) 94/53 (!) 118/58  108/63  Pulse: 86 79 82 82  Resp: 16 16 16    Temp: 98.6 F (37 C) 98 F (36.7 C)    TempSrc: Oral Oral    SpO2: 99% 96% 99%   Weight:      Height:        Intake/Output Summary (Last 24 hours) at 09/19/2019 0949 Last data filed at 09/18/2019 1930 Gross per 24 hour  Intake 66 ml  Output 350 ml  Net -284 ml   Last 3 Weights 09/18/2019 09/17/2019 09/15/2019  Weight (lbs) 198 lb 4.8 oz 198 lb 4.8 oz 194 lb 10.7 oz  Weight (kg) 89.948 kg 89.948 kg 88.3 kg      Telemetry    NSR with frequent PVCs, ventricular bigeminy; HR 70-80s - Personally Reviewed  ECG    No new - Personally Reviewed  Physical Exam   GEN: No acute distress.   Neck: mild JVD Cardiac: RRR, no murmurs, rubs, or gallops.  Respiratory: Clear to auscultation bilaterally. GI: Soft, nontender, non-distended  MS: trace edema; No deformity. Neuro:   Nonfocal  Psych: Normal affect   Labs    High Sensitivity Troponin:   Recent Labs  Lab 09/14/19 0129 09/14/19 0415  TROPONINIHS 138* 142*      Chemistry Recent Labs  Lab 09/17/19 0238 09/18/19 0509 09/19/19 0620  NA 142 142 144  K 4.1 3.8 3.9  CL 112* 110 112*  CO2 21* 20* 22  GLUCOSE 223* 146* 133*  BUN 29* 34* 34*  CREATININE 1.78* 1.74* 1.78*  CALCIUM 8.9 8.9 8.8*  GFRNONAA 38* 39* 38*  GFRAA 44* 45* 44*  ANIONGAP 9 12 10      Hematology Recent Labs  Lab 09/14/19 0129 09/15/19 0455 09/16/19 0845  WBC 6.3 6.1 5.4  RBC 3.59* 3.46* 3.22*  HGB 10.3* 9.9* 9.5*  HCT 32.3* 30.8* 29.1*  MCV 90.0 89.0 90.4  MCH 28.7 28.6 29.5  MCHC 31.9 32.1 32.6  RDW 15.1 15.7* 15.4  PLT 356 335 309    BNP Recent Labs  Lab 09/18/19 0733  BNP 945.0*     DDimer No results for input(s): DDIMER in the last 168 hours.   Radiology    DG Chest 1 View  Result Date: 09/17/2019 CLINICAL DATA:  Respiratory distress.  EXAM: CHEST  1 VIEW COMPARISON:  Chest radiograph 09/14/2019 FINDINGS: Monitoring leads overlie the patient. Stable cardiomegaly status post median sternotomy and CABG procedure. No consolidative pulmonary opacities. No pleural effusion or pneumothorax. IMPRESSION: Cardiomegaly. No acute cardiopulmonary process. Electronically Signed   By: Annia Belt M.D.   On: 09/17/2019 14:34   ECHO TEE  Result Date: 09/18/2019    TRANSESOPHOGEAL ECHO REPORT   Patient Name:   Mark Fernandez Date of Exam: 09/18/2019 Medical Rec #:  [id number]       Height:       64.0 in Accession #:    [id number]      Weight:       198.3 lb Date of Birth:  05-Mar-1951       BSA:          1.949 m Patient Age:    69 years        BP:           125/68 mmHg Patient Gender: M               HR:           74 bpm. Exam Location:  Inpatient Procedure: Transesophageal Echo, Color Doppler and Cardiac Doppler Indications:     mitral insufficiency 201046  History:         Patient has prior history of Echocardiogram  examinations, most                  recent 09/14/2019. Cardiomyopathy, CAD, Prior CABG; Risk                  Factors:Hypertension and Diabetes.  Sonographer:     Thurman Coyer RDCS (AE) Referring Phys:  [id number] Francee Nodal Brendy Ficek Diagnosing Phys: Zoila Shutter MD PROCEDURE: After discussion of the risks and benefits of a TEE, an informed consent was obtained from the patient. The transesophogeal probe was passed without difficulty through the esophogus of the patient. Imaged were obtained with the patient in a supine position. Sedation performed by different physician. The patient was monitored while under deep sedation. Anesthestetic sedation was provided intravenously by Anesthesiology: 195mg  of Propofol. Image quality was excellent. The patient's vital signs; including heart rate, blood pressure, and oxygen saturation; remained stable throughout the procedure. The patient developed no complications during the procedure. IMPRESSIONS  1. Left ventricular ejection fraction, by estimation, is 25 to 30%. The left ventricle has severely decreased function. The left ventricle demonstrates global hypokinesis. The left ventricular internal cavity size was mildly dilated. There is moderate left ventricular hypertrophy.  2. Right ventricular systolic function mild to moderately reduced. The right ventricular size is normal.  3. Left atrial size was mildly dilated. No left atrial/left atrial appendage thrombus was detected.  4. Mild mitral annular dilatation.  5. The mitral valve is abnormal. Severe mitral valve regurgitation.  6. The tricuspid valve is abnormal. Tricuspid valve regurgitation is moderate.  7. The aortic valve is tricuspid. Aortic valve regurgitation is not visualized. FINDINGS  Left Ventricle: Left ventricular ejection fraction, by estimation, is 25 to 30%. The left ventricle has severely decreased function. The left ventricle demonstrates global hypokinesis. The left ventricular internal cavity size was  mildly dilated. There is moderate left ventricular hypertrophy. Abnormal (paradoxical) septal motion, consistent with left bundle branch block. Right Ventricle: The right ventricular size is normal. No increase in right ventricular wall thickness. Right ventricular systolic function mild to moderately reduced. Left Atrium: Left atrial size was mildly dilated. Spontaneous  echo contrast was present in the left atrium. No left atrial/left atrial appendage thrombus was detected. Right Atrium: Right atrial size was normal in size. Pericardium: There is no evidence of pericardial effusion. Mitral Valve: The mitral valve is abnormal. There is mild thickening of the mitral valve leaflet(s). Mild mitral annular calcification. Mild mitral annular dilatation. Severe mitral valve regurgitation, with centrally-directed jet. Pulmonary venous flow shows systolic flow reversal. Tricuspid Valve: The tricuspid valve is abnormal. Tricuspid valve regurgitation is moderate. RVSP is moderately elevated at 45 mmHg + RAP. Aortic Valve: The aortic valve is tricuspid. Aortic valve regurgitation is not visualized. Pulmonic Valve: The pulmonic valve was grossly normal. Pulmonic valve regurgitation is trivial. Aorta: The aortic root and ascending aorta are structurally normal, with no evidence of dilitation. Venous: The left upper pulmonary vein and left lower pulmonary vein are normal. A pattern of systolic flow reversal, suggestive of severe mitral regurgitation is recorded from the left upper pulmonary vein. IAS/Shunts: No atrial level shunt detected by color flow Doppler.  MR Peak grad:    176.4 mmHg  TRICUSPID VALVE MR Mean grad:    118.0 mmHg  TR Peak grad:   44.6 mmHg MR Vmax:         664.00 cm/s TR Vmax:        334.00 cm/s MR Vmean:        505.0 cm/s MR PISA:         4.36 cm MR PISA Eff ROA: 25 mm MR PISA Radius:  0.83 cm Lyman Bishop MD Electronically signed by Lyman Bishop MD Signature Date/Time: 09/18/2019/11:04:24 AM    Final      Cardiac Studies   TTE 09/14/2019 1. Global hypokinesis; septal dyssynergy; overall moderate to severe LV  dysfunction; grade 2 diastolic dysfunction; severe MR; mild LAE.  2. Left ventricular ejection fraction, by estimation, is 30 to 35%. The  left ventricle has moderate to severely decreased function. The left  ventricle demonstrates global hypokinesis. The left ventricular internal  cavity size was mildly dilated. Left  ventricular diastolic parameters are consistent with Grade II diastolic  dysfunction (pseudonormalization). Elevated left atrial pressure.  3. Right ventricular systolic function is normal. The right ventricular  size is normal. There is moderately elevated pulmonary artery systolic  pressure.  4. Left atrial size was mildly dilated.  5. The mitral valve is normal in structure. Severe mitral valve  regurgitation. No evidence of mitral stenosis.  6. The aortic valve is tricuspid. Aortic valve regurgitation is not  visualized. Mild aortic valve sclerosis is present, with no evidence of  aortic valve stenosis.  7. The inferior vena cava is normal in size with greater than 50%  respiratory variability, suggesting right atrial pressure of 3 mmHg.   Left heart cath [facility] 11/30/18: Severe 90% mid Lcx and 95% distal Lcx stenosis-culprit vessel s/p DES x 2: Mid: 2.5 x 12 DES Resolute Onyx, Distal: 2.0 x 8 mm DES Resolute Onyx Patent LIMA-LAD Patent SVG-Om1 Patent SVG-RCA Total occlusion of native mid LAD and mid RCA  Recommendations:  Continue DAPT with aspirin and ticagrelor Continue CAD medical therapy  Patient Profile     69 y.o. male with hx of CAD s/p CABG (LIMA-LAD, SVG-D1, SVG-OM, SVG-RCA in 2007), EF 45%, SVT, DM, HTN, HLD, former smoker, intolerant to statins who was admitted 09/14/2019 with CP and SOB. Found to be in acute decompensated HF.  Assessment & Plan    Acute decompensated systolic HF, EF 32-95% - EF now 30-35% with severe  MR - BNP  up to 900 and JVD - IV lasix 160mg  daily started - continue BB - Bi-dil TID started - Patient put out urine overnight. Possibly incomplete although patient says he has not been peeing that much.  - Daily weights - creatinine 1.74 > 1.78 - Plan to continue with diuresis. Would continue to hold on cath given elevated kidney function. Will discuss with MD.  Dizziness/hypotension - possibly from overmedicating - entresto/spiro held - Most recent pressure 108/63  AKI - possibly related to hypotension - Entresto and spiro held - IV lasix as above - creatinine 1.78 today. Baseline around 1.3.   Severe MR - per echo this admission - TEE with severe MR - worse in the setting of volume overload/acute HF  LBBB - new - will eventually need R/LHC - might need ICD, evaluation as OP  CAD s/p CABG and recent PCI - Heart cath June 2020 a [facility] with patent LIMA to LAD, patent vein graft OM1, patent vein graft - continue aspirin and prasugrel - continue statin  For questions or updates, please contact [facility] Please consult [email or web address] for contact info under        Signed, Ilsa Bonello July 2020, PA-C  09/19/2019, 9:49 AM

## 2019-09-20 LAB — BASIC METABOLIC PANEL
Anion gap: 10 (ref 5–15)
BUN: 32 mg/dL — ABNORMAL HIGH (ref 8–23)
CO2: 25 mmol/L (ref 22–32)
Calcium: 9.1 mg/dL (ref 8.9–10.3)
Chloride: 108 mmol/L (ref 98–111)
Creatinine, Ser: 1.94 mg/dL — ABNORMAL HIGH (ref 0.61–1.24)
GFR calc Af Amer: 39 mL/min — ABNORMAL LOW (ref >60)
GFR calc non Af Amer: 34 mL/min — ABNORMAL LOW (ref >60)
Glucose, Bld: 147 mg/dL — ABNORMAL HIGH (ref 70–99)
Potassium: 4.3 mmol/L (ref 3.5–5.1)
Sodium: 143 mmol/L (ref 135–145)

## 2019-09-20 LAB — GLUCOSE, CAPILLARY
Glucose-Capillary: 139 mg/dL — ABNORMAL HIGH (ref 70–99)
Glucose-Capillary: 131 mg/dL — ABNORMAL HIGH (ref 70–99)
Glucose-Capillary: 147 mg/dL — ABNORMAL HIGH (ref 70–99)
Glucose-Capillary: 133 mg/dL — ABNORMAL HIGH (ref 70–99)

## 2019-09-20 MED ORDER — METOLAZONE 5 MG PO TABS
5.0000 mg | ORAL_TABLET | Freq: Once | ORAL | Status: AC
  Administered 2019-09-20: 5 mg via ORAL
  Filled 2019-09-20: qty 1

## 2019-09-20 MED ORDER — FUROSEMIDE 10 MG/ML IJ SOLN
160.0000 mg | Freq: Two times a day (BID) | INTRAVENOUS | Status: AC
  Administered 2019-09-20 (×2): 160 mg via INTRAVENOUS
  Filled 2019-09-20 (×2): qty 16

## 2019-09-20 MED ORDER — SPIRONOLACTONE 12.5 MG HALF TABLET: 12.5000 mg | ORAL_TABLET | Freq: Every day | ORAL | Status: DC

## 2019-09-20 NOTE — Progress Notes (Signed)
Occupational Therapy Treatment Patient Details Name: Mark Fernandez MRN: [id number] DOB: February 12, 1951 Today's Date: 09/20/2019    History of present illness 69 y.o. male with medical history significant of HTN, HLD, CAD s/p CABG in 2007 and s/p 2 DES in 11/2018, NSVT, DM type II, systolic CHF last LVEF 00-76% presents to ED on 3/25 with complaints of 2 days of chest pain and shortness of breath. TEE 3/29 reveals severe MR, moderate TR, LVEF 25-30%.   OT comments  Pt progressing toward stated goals. Focused session on BADL mobility with ECS strategies and practice and safety with rollator. Pt min guard for functional mobility this date. He continue to require min A for LB strategies and multistep BADL due to activity tolerance. Educated pt on use of rollator with breaks, sitting on rollator for BADL, and pacing self in the home. D/c recs remain appropriate. Pt really would like to have aide to assist for IADLs in the home. Will continue to follow to address activity tolerance and BADL independence.    Follow Up Recommendations  Home health OT;Other (comment);Supervision - Intermittent(HH aide)    Equipment Recommendations  3 in 1 bedside commode;Other (comment)(rollator)    Recommendations for Other Services      Precautions / Restrictions Precautions Precautions: Fall Restrictions Weight Bearing Restrictions: No       Mobility Bed Mobility               General bed mobility comments: up in recliner upon entry   Transfers Overall transfer level: Needs assistance Equipment used: 4-wheeled walker Transfers: Sit to/from Stand Sit to Stand: Min guard         General transfer comment: min guard for safety and balance    Balance Overall balance assessment: Needs assistance Sitting-balance support: No upper extremity supported;Feet supported Sitting balance-Leahy Scale: Good     Standing balance support: Single extremity supported;During functional activity;No upper  extremity supported Standing balance-Leahy Scale: Fair Standing balance comment: dynamically requires UE support                           ADL either performed or assessed with clinical judgement   ADL                       Lower Body Dressing: Minimal assistance;Sit to/from stand   Toilet Transfer: Minimal assistance;Ambulation;RW Toilet Transfer Details (indicate cue type and reason): simulated in room         Functional mobility during ADLs: Min guard;Rolling walker;Cueing for safety General ADL Comments: pt limited by decreased activity tolerance     Vision       Perception     Praxis      Cognition Arousal/Alertness: Awake/alert Behavior During Therapy: WFL for tasks assessed/performed Overall Cognitive Status: Impaired/Different from baseline Area of Impairment: Problem solving;Safety/judgement                         Safety/Judgement: Decreased awareness of safety   Problem Solving: Slow processing;Requires verbal cues General Comments: increased time and cueing needed for safety        Exercises     Shoulder Instructions       General Comments      Pertinent Vitals/ Pain       Pain Assessment: No/denies pain  Home Living  Prior Functioning/Environment              Frequency  Min 2X/week        Progress Toward Goals  OT Goals(current goals can now be found in the care plan section)  Progress towards OT goals: Progressing toward goals  Acute Rehab OT Goals Patient Stated Goal: have more tolerance for activity OT Goal Formulation: With patient Time For Goal Achievement: 10/02/19 Potential to Achieve Goals: Good  Plan Discharge plan remains appropriate    Co-evaluation                 AM-PAC OT "6 Clicks" Daily Activity     Outcome Measure   Help from another person eating meals?: None Help from another person taking care of  personal grooming?: A Little Help from another person toileting, which includes using toliet, bedpan, or urinal?: A Little Help from another person bathing (including washing, rinsing, drying)?: A Little Help from another person to put on and taking off regular upper body clothing?: A Little Help from another person to put on and taking off regular lower body clothing?: A Little 6 Click Score: 19    End of Session Equipment Utilized During Treatment: Rolling walker  OT Visit Diagnosis: Other abnormalities of gait and mobility (R26.89);Muscle weakness (generalized) (M62.81)   Activity Tolerance Patient tolerated treatment well   Patient Left in chair;with call bell/phone within reach   Nurse Communication Mobility status        Time: 1000-1015 OT Time Calculation (min): 15 min  Charges: OT General Charges $OT Visit: 1 Visit OT Treatments $Self Care/Home Management : 8-22 mins  Dalphine Handing, MSOT, OTR/L Acute Rehabilitation Services [facility] Office Number: [phone] Pager: [phone]  Dalphine Handing 09/20/2019, 12:53 PM

## 2019-09-20 NOTE — Progress Notes (Signed)
Cardiology Progress Note  Patient ID: Mark Fernandez MRN: [id number] DOB: February 09, 1951 Date of Encounter: 09/20/2019  Primary Cardiologist: Minus Breeding, MD  Subjective  Net negative 743 cc. Feels better.   ROS:  All other ROS reviewed and negative. Pertinent positives noted in the HPI.     Inpatient Medications  Scheduled Meds: . aspirin EC  81 mg Oral Daily  . atorvastatin  80 mg Oral q1800  . fluticasone furoate-vilanterol  1 puff Inhalation Daily   And  . umeclidinium bromide  1 puff Inhalation Daily  . heparin  5,000 Units Subcutaneous Q8H  . insulin aspart  0-15 Units Subcutaneous TID WC  . insulin glargine  18 Units Subcutaneous QHS  . isosorbide-hydrALAZINE  1 tablet Oral TID  . loratadine  10 mg Oral Daily  . metoprolol succinate  50 mg Oral Daily  . potassium chloride  40 mEq Oral BID  . prasugrel  10 mg Oral Daily  . tamsulosin  0.4 mg Oral QPC supper   Continuous Infusions: . furosemide 160 mg (09/20/19 0912)   PRN Meds: acetaminophen, albuterol, alum & mag hydroxide-simeth, ondansetron (ZOFRAN) IV   Vital Signs   Vitals:   09/19/19 1740 09/19/19 2350 09/20/19 0517 09/20/19 0757  BP: 101/67 123/67 111/61   Pulse: 85 88 84 70  Resp: 17 16 17 16   Temp: 98 F (36.7 C) 97.9 F (36.6 C) 98 F (36.7 C)   TempSrc: Oral Oral Oral   SpO2: 100% 100% 100% 99%  Weight:   89.7 kg   Height:        Intake/Output Summary (Last 24 hours) at 09/20/2019 1144 Last data filed at 09/19/2019 2300 Gross per 24 hour  Intake 132 ml  Output 875 ml  Net -743 ml   Last 3 Weights 09/20/2019 09/18/2019 09/17/2019  Weight (lbs) 197 lb 12 oz 198 lb 4.8 oz 198 lb 4.8 oz  Weight (kg) 89.7 kg 89.948 kg 89.948 kg      Telemetry  Overnight telemetry shows SR with PVCs, which I personally reviewed.   Physical Exam   Vitals:   09/19/19 1740 09/19/19 2350 09/20/19 0517 09/20/19 0757  BP: 101/67 123/67 111/61   Pulse: 85 88 84 70  Resp: 17 16 17 16   Temp: 98 F (36.7 C) 97.9  F (36.6 C) 98 F (36.7 C)   TempSrc: Oral Oral Oral   SpO2: 100% 100% 100% 99%  Weight:   89.7 kg   Height:         Intake/Output Summary (Last 24 hours) at 09/20/2019 1144 Last data filed at 09/19/2019 2300 Gross per 24 hour  Intake 132 ml  Output 875 ml  Net -743 ml    Last 3 Weights 09/20/2019 09/18/2019 09/17/2019  Weight (lbs) 197 lb 12 oz 198 lb 4.8 oz 198 lb 4.8 oz  Weight (kg) 89.7 kg 89.948 kg 89.948 kg    Body mass index is 33.94 kg/m.   General: Well nourished, well developed, in no acute distress Head: Atraumatic, normal size  Eyes: PEERLA, EOMI  Neck: Supple, JVD 5-7 cm H20 Endocrine: No thryomegaly Cardiac: 3/6 HSM Lungs: Clear to auscultation bilaterally, no wheezing, rhonchi or rales  Abd: Soft, nontender, no hepatomegaly  Ext: 1+ edema  Musculoskeletal: No deformities, BUE and BLE strength normal and equal Skin: Warm and dry, no rashes   Neuro: Alert and oriented to person, place, time, and situation, CNII-XII grossly intact, no focal deficits  Psych: Normal mood and affect   Labs  High Sensitivity Troponin:   Recent Labs  Lab 09/14/19 0129 09/14/19 0415  TROPONINIHS 138* 142*     Cardiac EnzymesNo results for input(s): TROPONINI in the last 168 hours. No results for input(s): TROPIPOC in the last 168 hours.  Chemistry Recent Labs  Lab 09/18/19 0509 09/19/19 0620 09/20/19 0544  NA 142 144 143  K 3.8 3.9 4.3  CL 110 112* 108  CO2 20* 22 25  GLUCOSE 146* 133* 147*  BUN 34* 34* 32*  CREATININE 1.74* 1.78* 1.94*  CALCIUM 8.9 8.8* 9.1  GFRNONAA 39* 38* 34*  GFRAA 45* 44* 39*  ANIONGAP 12 10 10     Hematology Recent Labs  Lab 09/14/19 0129 09/15/19 0455 09/16/19 0845  WBC 6.3 6.1 5.4  RBC 3.59* 3.46* 3.22*  HGB 10.3* 9.9* 9.5*  HCT 32.3* 30.8* 29.1*  MCV 90.0 89.0 90.4  MCH 28.7 28.6 29.5  MCHC 31.9 32.1 32.6  RDW 15.1 15.7* 15.4  PLT 356 335 309   BNP Recent Labs  Lab 09/18/19 0733  BNP 945.0*    DDimer No results for  input(s): DDIMER in the last 168 hours.   Radiology  No results found.  Cardiac Studies  TTE 09/14/2019 1. Global hypokinesis; septal dyssynergy; overall moderate to severe LV  dysfunction; grade 2 diastolic dysfunction; severe MR; mild LAE.  2. Left ventricular ejection fraction, by estimation, is 30 to 35%. The  left ventricle has moderate to severely decreased function. The left  ventricle demonstrates global hypokinesis. The left ventricular internal  cavity size was mildly dilated. Left  ventricular diastolic parameters are consistent with Grade II diastolic  dysfunction (pseudonormalization). Elevated left atrial pressure.  3. Right ventricular systolic function is normal. The right ventricular  size is normal. There is moderately elevated pulmonary artery systolic  pressure.  4. Left atrial size was mildly dilated.  5. The mitral valve is normal in structure. Severe mitral valve  regurgitation. No evidence of mitral stenosis.  6. The aortic valve is tricuspid. Aortic valve regurgitation is not  visualized. Mild aortic valve sclerosis is present, with no evidence of  aortic valve stenosis.  7. The inferior vena cava is normal in size with greater than 50%  respiratory variability, suggesting right atrial pressure of 3 mmHg.  Patient Profile  69 y.o.malewith hx of CAD s/p CABG (LIMA-LAD, SVG-D1, SVG-OM, SVG-RCA in 2007), EF 45%, SVT, DM, HTN, HLD, former smoker, intolerant to statins who was admitted 09/14/2019 with CP and SOB. Found to be in acute decompensated HF.  Assessment & Plan   1.  Acute decompensated systolic heart failure, EF 30-35% -EF now 30-35% with severe MR.  BNP 900. Entresto held in the setting of AKI -I/Os positive this admission but not recorded accurately.  -plan for 5 mg metolazone and then 160 mg lasix x 2 today -plan to send home tomorrow -will plan to continue metoprolol succinate 50 mg QD, Bidil TID -CKD so no ACE/ARB/ARNI -EKG with new  left bundle and QRS 160.  This is creating significant dyssynchrony resulting in worsening MR and overall worsening heart failure.  His EF was low a year ago.  He will need to be evaluated for a biventricular ICD.  This will be done as an outpatient. -I do agree he needs a left and right heart catheterization. I think it may be best to get him home and have this done as outpatient   2.  Dizziness hypotension -Unclear etiology.  Possibly overmedicated. -resolved   3.  AKI -stable -  may be new baseline   4.  CAD status post CABG and recent PCI -Most recent heart cath in June 2020 at [facility] showed patent LIMA to LAD, patent vein graft OM1, patent vein graft RCA.  He did undergo PCI to the mid and distal left circumflex. -He will remain on aspirin and prasugrel for now. -He is on high intensity statin.  5.  Severe MR -Related to decompensated systolic heart failure and volume overload.  Regurgitant lesions are always worse in the setting of volume overload.  This is not a concern at this time.  I suspect he will ultimately need a biventricular pacer to shorten his QRS given his left bundle.  The MR often improves with that as well.  6.  Left bundle branch block, QRS 160 -This is new and is dyssynchrony is likely worsening his LV function.  He still will need a left and right heart catheterization but now not a time.  We will get him as dry as possible and then have him evaluated for a cath and BiV ICD as an outpatient.  For questions or updates, please contact [facility] Please consult [email or web address] for contact info under   Time Spent with Patient: I have spent a total of 35 minutes with patient reviewing hospital notes, telemetry, EKGs, labs and examining the patient as well as establishing an assessment and plan that was discussed with the patient.  > 50% of time was spent in direct patient care.    Signed, Lenna Gilford. Flora Lipps, MD [facility]  09/20/2019 11:44 AM

## 2019-09-20 NOTE — Progress Notes (Signed)
PROGRESS NOTE    Mark Fernandez  VPX:[id number] DOB: 12-05-1950 DOA: 09/14/2019 PCP: Jarome Matin, MD    Brief Narrative:  Patient was admitted to the hospital with working diagnosis of atypical chest pain, in the setting of acute on chronic systolic heart failure decompensation and acute kidney injury on chronic kidney disease.  69 year old male who presented with chest pain. He does have significant past medical history for hypertension, dyslipidemia, coronary disease, status post bypass grafting in 2007, angioplasty 2020, type 2 diabetes mellitus and systolic heart failure. Patient reported substernal chest pain,pressure in nature.On his initial physical examination, 107/65, heart rate 78, respiratory rate17, oxygen saturation 99%. Lungs are clear to auscultation bilaterally, he had a 3/6 systolic murmur the apex, abdomen soft, no lower extremity edema. Sodium 143, potassium 3.7, chloride 109, bicarb 21, glucose 72, BUN 23, creatinine 2.34, white count 6.1, hemoglobin 9.9, hematocrit 30.8, platelets 335.His chest radiograph had mild cardiomegaly, bilateral interstitial infiltrates, fluid in the fissures, andcephalization of vasculature. EKG 74 bpm, left axis deviation, left bundle branch block, manually corrected QTC 494,sinus rhythm, no ST segment or T wave changes.  Patientreceived furosemide for signs of volume overload and cardiorenal syndrome. Further workup with TEE showed severe mitral regurgitation.  Patient now started on more aggressive diuresis with furosemide and heart failure management.    Assessment & Plan:   Principal Problem:   Acute on chronic systolic CHF (congestive heart failure) (HCC) Active Problems:   Coronary artery disease   Diabetes (HCC)   Acute kidney injury superimposed on chronic kidney disease (HCC)   Obesity (BMI 30.0-34.9)   Normocytic anemia   Dyspnea on minimal exertion   1.  Acute on chronic systolic congestive heart  failure, with acute decompensation. Sp CABG and PCI ischemic cardiomyopathy, with mitral regurgitation. Manually corrected Qtc is 494 (note patient has LBBB). TEE with reduced LV systolic function and severe MR. Urine output not recorded correctly.  Feeling much better today.  Sister at the bedside.  Wants to go home and have cath done as outpatient.  Cardiology recommends keeping in the hospital overnight for more diuresis and potentially discharging home tomorrow and arrange outpatient cardiac cath.  Continue current diuresis with furosemide and metolazone.  Continue metoprolol, aspirin and statin.  2. AKI on ckd stage IIIb/ hypernatremia/ cardiorenal syndrome.  Slight jump in creatinine up to 1.4 today.  Normal serum bicarb.  Good urine output.  Baseline creatinine around 1.6.  3. T2DM/ dyslipidemia. Blood sugar controlled.  Continue current dose of Lantus, SSI and statin.   4. Anemia/ multifactorial. Hgb has been stable.   5. Obesity class 2. Calculated BMI is 33, outpatient follow up.    DVT prophylaxis:enoxaparin Code Status:full Family Communication: Sister at the bedside. Disposition Plan/ discharge barriers:patient from home, barrier for dc decompensated ischemic cardiomyopathy, needing further diuresis.  Potential discharge home tomorrow.   Consultants:   Cardiology   i  Subjective: Seen and examined.  Sister at the bedside.  He feels much better.  Denied any chest pain or shortness of breath.  Objective: Vitals:   09/19/19 2350 09/20/19 0517 09/20/19 0757 09/20/19 1231  BP: 123/67 111/61  109/64  Pulse: 88 84 70 80  Resp: 16 17 16 16   Temp: 97.9 F (36.6 C) 98 F (36.7 C)  98 F (36.7 C)  TempSrc: Oral Oral  Oral  SpO2: 100% 100% 99% 99%  Weight:  89.7 kg    Height:        Intake/Output Summary (Last 24  hours) at 09/20/2019 1321 Last data filed at 09/19/2019 2300 Gross per 24 hour  Intake 132 ml  Output 875 ml  Net -743 ml   Filed Weights    09/17/19 0517 09/18/19 0743 09/20/19 0517  Weight: 89.9 kg 89.9 kg 89.7 kg    Examination:   General exam: Appears calm and comfortable  Respiratory system: Clear to auscultation. Respiratory effort normal. Cardiovascular system: S1 & S2 heard, RRR. No JVD, murmurs, rubs, gallops or clicks.  Trace lower extremity edema. Gastrointestinal system: Abdomen is nondistended, soft and nontender. No organomegaly or masses felt. Normal bowel sounds heard. Central nervous system: Alert and oriented. No focal neurological deficits. Extremities: Symmetric 5 x 5 power. Skin: No rashes, lesions or ulcers.  Psychiatry: Judgement and insight appear normal. Mood & affect appropriate.   Data Reviewed: I have personally reviewed following labs and imaging studies  CBC: Recent Labs  Lab 09/14/19 0129 09/15/19 0455 09/16/19 0845  WBC 6.3 6.1 5.4  HGB 10.3* 9.9* 9.5*  HCT 32.3* 30.8* 29.1*  MCV 90.0 89.0 90.4  PLT 356 335 309   Basic Metabolic Panel: Recent Labs  Lab 09/16/19 0845 09/17/19 0238 09/18/19 0509 09/19/19 0620 09/20/19 0544  NA 139 142 142 144 143  K 4.2 4.1 3.8 3.9 4.3  CL 111 112* 110 112* 108  CO2 20* 21* 20* 22 25  GLUCOSE 282* 223* 146* 133* 147*  BUN 28* 29* 34* 34* 32*  CREATININE 1.64* 1.78* 1.74* 1.78* 1.94*  CALCIUM 8.7* 8.9 8.9 8.8* 9.1  MG  --   --   --  2.0  --    GFR: Estimated Creatinine Clearance: 35.3 mL/min (A) (by C-G formula based on SCr of 1.94 mg/dL (H)). Liver Function Tests: No results for input(s): AST, ALT, ALKPHOS, BILITOT, PROT, ALBUMIN in the last 168 hours. No results for input(s): LIPASE, AMYLASE in the last 168 hours. No results for input(s): AMMONIA in the last 168 hours. Coagulation Profile: No results for input(s): INR, PROTIME in the last 168 hours. Cardiac Enzymes: No results for input(s): CKTOTAL, CKMB, CKMBINDEX, TROPONINI in the last 168 hours. BNP (last 3 results) No results for input(s): PROBNP in the last 8760  hours. HbA1C: No results for input(s): HGBA1C in the last 72 hours. CBG: Recent Labs  Lab 09/19/19 1158 09/19/19 1643 09/19/19 2111 09/20/19 0550 09/20/19 1104  GLUCAP 113* 144* 197* 139* 131*   Lipid Profile: No results for input(s): CHOL, HDL, LDLCALC, TRIG, CHOLHDL, LDLDIRECT in the last 72 hours. Thyroid Function Tests: No results for input(s): TSH, T4TOTAL, FREET4, T3FREE, THYROIDAB in the last 72 hours. Anemia Panel: No results for input(s): VITAMINB12, FOLATE, FERRITIN, TIBC, IRON, RETICCTPCT in the last 72 hours.    Radiology Studies: I have reviewed all of the imaging during this hospital visit personally     Scheduled Meds: . aspirin EC  81 mg Oral Daily  . atorvastatin  80 mg Oral q1800  . fluticasone furoate-vilanterol  1 puff Inhalation Daily   And  . umeclidinium bromide  1 puff Inhalation Daily  . heparin  5,000 Units Subcutaneous Q8H  . insulin aspart  0-15 Units Subcutaneous TID WC  . insulin glargine  18 Units Subcutaneous QHS  . isosorbide-hydrALAZINE  1 tablet Oral TID  . loratadine  10 mg Oral Daily  . metoprolol succinate  50 mg Oral Daily  . potassium chloride  40 mEq Oral BID  . prasugrel  10 mg Oral Daily  . tamsulosin  0.4 mg Oral  QPC supper   Continuous Infusions: . furosemide 160 mg (09/20/19 0912)     LOS: 5 days   Darliss Cheney, MD [facility]

## 2019-09-21 LAB — BASIC METABOLIC PANEL
Anion gap: 14 (ref 5–15)
Anion gap: 13 (ref 5–15)
BUN: 32 mg/dL — ABNORMAL HIGH (ref 8–23)
BUN: 33 mg/dL — ABNORMAL HIGH (ref 8–23)
CO2: 23 mmol/L (ref 22–32)
CO2: 24 mmol/L (ref 22–32)
Calcium: 9.3 mg/dL (ref 8.9–10.3)
Calcium: 9.5 mg/dL (ref 8.9–10.3)
Chloride: 107 mmol/L (ref 98–111)
Chloride: 106 mmol/L (ref 98–111)
Creatinine, Ser: 2.17 mg/dL — ABNORMAL HIGH (ref 0.61–1.24)
Creatinine, Ser: 2.26 mg/dL — ABNORMAL HIGH (ref 0.61–1.24)
GFR calc Af Amer: 34 mL/min — ABNORMAL LOW (ref >60)
GFR calc Af Amer: 33 mL/min — ABNORMAL LOW (ref >60)
GFR calc non Af Amer: 30 mL/min — ABNORMAL LOW (ref >60)
GFR calc non Af Amer: 28 mL/min — ABNORMAL LOW (ref >60)
Glucose, Bld: 100 mg/dL — ABNORMAL HIGH (ref 70–99)
Glucose, Bld: 104 mg/dL — ABNORMAL HIGH (ref 70–99)
Potassium: 4.2 mmol/L (ref 3.5–5.1)
Potassium: 4.5 mmol/L (ref 3.5–5.1)
Sodium: 144 mmol/L (ref 135–145)
Sodium: 143 mmol/L (ref 135–145)

## 2019-09-21 LAB — GLUCOSE, CAPILLARY
Glucose-Capillary: 91 mg/dL (ref 70–99)
Glucose-Capillary: 97 mg/dL (ref 70–99)
Glucose-Capillary: 126 mg/dL — ABNORMAL HIGH (ref 70–99)
Glucose-Capillary: 157 mg/dL — ABNORMAL HIGH (ref 70–99)

## 2019-09-21 NOTE — Progress Notes (Signed)
Cardiology Progress Note  Patient ID: Mark Fernandez MRN: [id number] DOB: 09-04-1950 Date of Encounter: 09/21/2019  Primary Cardiologist: Rollene Rotunda, MD  Subjective  Not much urine output recorded. He looks and feels better. I suspect inaccurate I/Os.   ROS:  All other ROS reviewed and negative. Pertinent positives noted in the HPI.     Inpatient Medications  Scheduled Meds:  aspirin EC  81 mg Oral Daily   atorvastatin  80 mg Oral q1800   fluticasone furoate-vilanterol  1 puff Inhalation Daily   And   umeclidinium bromide  1 puff Inhalation Daily   heparin  5,000 Units Subcutaneous Q8H   insulin aspart  0-15 Units Subcutaneous TID WC   insulin glargine  18 Units Subcutaneous QHS   isosorbide-hydrALAZINE  1 tablet Oral TID   loratadine  10 mg Oral Daily   metoprolol succinate  50 mg Oral Daily   potassium chloride  40 mEq Oral BID   prasugrel  10 mg Oral Daily   tamsulosin  0.4 mg Oral QPC supper   Continuous Infusions:  PRN Meds: acetaminophen, albuterol, alum & mag hydroxide-simeth, ondansetron (ZOFRAN) IV   Vital Signs   Vitals:   09/20/19 1231 09/20/19 1812 09/21/19 0009 09/21/19 0506  BP: 109/64 (!) 92/58 106/63 99/61  Pulse: 80 83 83 79  Resp: 16 16 18 18   Temp: 98 F (36.7 C) 98.1 F (36.7 C) 98.2 F (36.8 C) 98.3 F (36.8 C)  TempSrc: Oral Oral Oral Oral  SpO2: 99% 100% 100% 100%  Weight:    84.8 kg  Height:        Intake/Output Summary (Last 24 hours) at 09/21/2019 1048 Last data filed at 09/21/2019 0800 Gross per 24 hour  Intake 2150 ml  Output 475 ml  Net 1675 ml   Last 3 Weights 09/21/2019 09/20/2019 09/18/2019  Weight (lbs) 187 lb 197 lb 12 oz 198 lb 4.8 oz  Weight (kg) 84.823 kg 89.7 kg 89.948 kg      Telemetry  Overnight telemetry shows SR with PVCs, which I personally reviewed.   Physical Exam   Vitals:   09/20/19 1231 09/20/19 1812 09/21/19 0009 09/21/19 0506  BP: 109/64 (!) 92/58 106/63 99/61  Pulse: 80 83 83 79   Resp: 16 16 18 18   Temp: 98 F (36.7 C) 98.1 F (36.7 C) 98.2 F (36.8 C) 98.3 F (36.8 C)  TempSrc: Oral Oral Oral Oral  SpO2: 99% 100% 100% 100%  Weight:    84.8 kg  Height:         Intake/Output Summary (Last 24 hours) at 09/21/2019 1048 Last data filed at 09/21/2019 0800 Gross per 24 hour  Intake 2150 ml  Output 475 ml  Net 1675 ml    Last 3 Weights 09/21/2019 09/20/2019 09/18/2019  Weight (lbs) 187 lb 197 lb 12 oz 198 lb 4.8 oz  Weight (kg) 84.823 kg 89.7 kg 89.948 kg    Body mass index is 32.1 kg/m.   General: Well nourished, well developed, in no acute distress Head: Atraumatic, normal size  Eyes: PEERLA, EOMI  Neck: Supple, JVD 5-7 cm H20 Endocrine: No thryomegaly Cardiac: 3/6 HSM Lungs: Clear to auscultation bilaterally, no wheezing, rhonchi or rales  Abd: Soft, nontender, no hepatomegaly  Ext: trace edema  Musculoskeletal: No deformities, BUE and BLE strength normal and equal Skin: Warm and dry, no rashes   Neuro: Alert and oriented to person, place, time, and situation, CNII-XII grossly intact, no focal deficits  Psych: Normal mood  and affect   Labs  High Sensitivity Troponin:   Recent Labs  Lab 09/14/19 0129 09/14/19 0415  TROPONINIHS 138* 142*     Cardiac EnzymesNo results for input(s): TROPONINI in the last 168 hours. No results for input(s): TROPIPOC in the last 168 hours.  Chemistry Recent Labs  Lab 09/19/19 0620 09/20/19 0544 09/21/19 0351  NA 144 143 144  K 3.9 4.3 4.2  CL 112* 108 107  CO2 22 25 23   GLUCOSE 133* 147* 100*  BUN 34* 32* 32*  CREATININE 1.78* 1.94* 2.17*  CALCIUM 8.8* 9.1 9.3  GFRNONAA 38* 34* 30*  GFRAA 44* 39* 34*  ANIONGAP 10 10 14     Hematology Recent Labs  Lab 09/15/19 0455 09/16/19 0845  WBC 6.1 5.4  RBC 3.46* 3.22*  HGB 9.9* 9.5*  HCT 30.8* 29.1*  MCV 89.0 90.4  MCH 28.6 29.5  MCHC 32.1 32.6  RDW 15.7* 15.4  PLT 335 309   BNP Recent Labs  Lab 09/18/19 0733  BNP 945.0*    DDimer No results for  input(s): DDIMER in the last 168 hours.   Radiology  No results found.  Cardiac Studies  TTE 09/14/2019 1. Global hypokinesis; septal dyssynergy; overall moderate to severe LV  dysfunction; grade 2 diastolic dysfunction; severe MR; mild LAE.  2. Left ventricular ejection fraction, by estimation, is 30 to 35%. The  left ventricle has moderate to severely decreased function. The left  ventricle demonstrates global hypokinesis. The left ventricular internal  cavity size was mildly dilated. Left  ventricular diastolic parameters are consistent with Grade II diastolic  dysfunction (pseudonormalization). Elevated left atrial pressure.  3. Right ventricular systolic function is normal. The right ventricular  size is normal. There is moderately elevated pulmonary artery systolic  pressure.  4. Left atrial size was mildly dilated.  5. The mitral valve is normal in structure. Severe mitral valve  regurgitation. No evidence of mitral stenosis.  6. The aortic valve is tricuspid. Aortic valve regurgitation is not  visualized. Mild aortic valve sclerosis is present, with no evidence of  aortic valve stenosis.  7. The inferior vena cava is normal in size with greater than 50%  respiratory variability, suggesting right atrial pressure of 3 mmHg.  Patient Profile  69 y.o.malewith hx of CAD s/p CABG (LIMA-LAD, SVG-D1, SVG-OM, SVG-RCA in 2007), EF 45%, SVT, DM, HTN, HLD, former smoker, intolerant to statins who was admitted 09/14/2019 with CP and SOB. Found to be in acute decompensated HF.  Assessment & Plan   1.  Acute decompensated systolic heart failure, EF 30-35% -EF now 30-35% with severe MR.  BNP 900. Entresto held in the setting of AKI. I/Os positive this admission but not recorded accurately.  -Cr has bumped but he looks euvolemic -will stop diuresis today. Home in AM as long as Cr does down -will plan to continue metoprolol succinate 50 mg QD, Bidil TID -CKD so no  ACE/ARB/ARNI -EKG with new left bundle and QRS 160.  This is creating significant dyssynchrony resulting in worsening MR and overall worsening heart failure.  His EF was low a year ago.  He will need to be evaluated for a biventricular ICD.  This will be done as an outpatient. -I will plan for close follow-up with me as outpatient for cath and BiV ICD eval  2.  Dizziness hypotension -Unclear etiology.  Possibly overmedicated. -resolved   3.  AKI -a bit up today -hold diuresis -home tomorrow hopefully   4.  CAD status post  CABG and recent PCI -Most recent heart cath in June 2020 at [facility] showed patent LIMA to LAD, patent vein graft OM1, patent vein graft RCA.  He did undergo PCI to the mid and distal left circumflex. -He will remain on aspirin and prasugrel for now. -He is on high intensity statin.  5.  Severe MR -Related to decompensated systolic heart failure and volume overload.  Regurgitant lesions are always worse in the setting of volume overload.  This is not a concern at this time.  I suspect he will ultimately need a biventricular pacer to shorten his QRS given his left bundle.  The MR often improves with that as well.  6.  Left bundle branch block, QRS 160 -This is new and is dyssynchrony is likely worsening his LV function.  He still will need a left and right heart catheterization but now not a time.  We will get him as dry as possible and then have him evaluated for a cath and BiV ICD as an outpatient.  For questions or updates, please contact [place] Please consult [email or web address] for contact info under   Time Spent with Patient: I have spent a total of 25 minutes with patient reviewing hospital notes, telemetry, EKGs, labs and examining the patient as well as establishing an assessment and plan that was discussed with the patient.  > 50% of time was spent in direct patient care.    Signed, Addison Naegeli. Audie Box, [place]  09/21/2019 10:48 AM

## 2019-09-21 NOTE — Progress Notes (Signed)
PROGRESS NOTE    Mark Fernandez  ZOX:[id number] DOB: June 09, 1951 DOA: 09/14/2019 PCP: Jarome Matin, MD    Brief Narrative:  Patient was admitted to the hospital with working diagnosis of atypical chest pain, in the setting of acute on chronic systolic heart failure decompensation and acute kidney injury on chronic kidney disease.  69 year old male who presented with chest pain. He does have significant past medical history for hypertension, dyslipidemia, coronary disease, status post bypass grafting in 2007, angioplasty 2020, type 2 diabetes mellitus and systolic heart failure. Patient reported substernal chest pain,pressure in nature.On his initial physical examination, 107/65, heart rate 78, respiratory rate17, oxygen saturation 99%. Lungs are clear to auscultation bilaterally, he had a 3/6 systolic murmur the apex, abdomen soft, no lower extremity edema. Sodium 143, potassium 3.7, chloride 109, bicarb 21, glucose 72, BUN 23, creatinine 2.34, white count 6.1, hemoglobin 9.9, hematocrit 30.8, platelets 335.His chest radiograph had mild cardiomegaly, bilateral interstitial infiltrates, fluid in the fissures, andcephalization of vasculature. EKG 74 bpm, left axis deviation, left bundle branch block, manually corrected QTC 494,sinus rhythm, no ST segment or T wave changes. Patientreceived furosemide for signs of volume overload and cardiorenal syndrome. Further workup with TEE showed severe mitral regurgitation.  Cardiology was on board.  Patient was started on more aggressive diuresis with furosemide and heart failure management.   Assessment & Plan:   Principal Problem:   Acute on chronic systolic CHF (congestive heart failure) (HCC) Active Problems:   Coronary artery disease   Diabetes (HCC)   Acute kidney injury superimposed on chronic kidney disease (HCC)   Obesity (BMI 30.0-34.9)   Normocytic anemia   Dyspnea on minimal exertion  1.  Acute on chronic systolic congestive  heart failure, with acute decompensation. Sp CABG and PCI ischemic cardiomyopathy, with mitral regurgitation. Manually corrected Qtc is 494 (note patient has LBBB). TEE with reduced LV systolic function and severe MR. Urine output not recorded correctly.  Continues to feel well and looks euvolemic however has bump in creatinine.  Cardiology stopping diuretics today. .  Continue metoprolol, aspirin and statin.  Cardiology to plan outpatient cath.  2. AKI on ckd stage IIIb/ hypernatremia/ cardiorenal syndrome.  Slight jump in creatinine up to 2.7 today from 1.94 yesterday today.  Normal serum bicarb.  Urine output only 450.  Doubt accuracy.   3. T2DM/ dyslipidemia. Blood sugar controlled.  Continue current dose of Lantus, SSI and statin.   4. Anemia/ multifactorial. Hgb has been stable.   5. Obesity class 2. Calculated BMI is 33, outpatient follow up.    DVT prophylaxis:enoxaparin Code Status:full Family Communication: No family present at bedside. Disposition Plan/ discharge barriers:patient from home, barrier for dc decompensated ischemic cardiomyopathy, jump in creatinine.   Consultants:   Cardiology   i  Subjective: Seen and examined.  Has no complaints.  Some exertional dyspnea but that is his baseline according to him.  Objective: Vitals:   09/20/19 1231 09/20/19 1812 09/21/19 0009 09/21/19 0506  BP: 109/64 (!) 92/58 106/63 99/61  Pulse: 80 83 83 79  Resp: 16 16 18 18   Temp: 98 F (36.7 C) 98.1 F (36.7 C) 98.2 F (36.8 C) 98.3 F (36.8 C)  TempSrc: Oral Oral Oral Oral  SpO2: 99% 100% 100% 100%  Weight:    84.8 kg  Height:        Intake/Output Summary (Last 24 hours) at 09/21/2019 1117 Last data filed at 09/21/2019 0800 Gross per 24 hour  Intake 2150 ml  Output 475 ml  Net 1675 ml   Filed Weights   09/18/19 0743 09/20/19 0517 09/21/19 0506  Weight: 89.9 kg 89.7 kg 84.8 kg    Examination:   General exam: Appears calm and comfortable  Respiratory  system: Clear to auscultation. Respiratory effort normal. Cardiovascular system: S1 & S2 heard, RRR. No JVD, murmurs, rubs, gallops or clicks. No pedal edema. Gastrointestinal system: Abdomen is nondistended, soft and nontender. No organomegaly or masses felt. Normal bowel sounds heard. Central nervous system: Alert and oriented. No focal neurological deficits. Extremities: Symmetric 5 x 5 power. Skin: No rashes, lesions or ulcers.  Psychiatry: Judgement and insight appear normal. Mood & affect appropriate.    Data Reviewed: I have personally reviewed following labs and imaging studies  CBC: Recent Labs  Lab 09/15/19 0455 09/16/19 0845  WBC 6.1 5.4  HGB 9.9* 9.5*  HCT 30.8* 29.1*  MCV 89.0 90.4  PLT 335 979   Basic Metabolic Panel: Recent Labs  Lab 09/17/19 0238 09/18/19 0509 09/19/19 0620 09/20/19 0544 09/21/19 0351  NA 142 142 144 143 144  K 4.1 3.8 3.9 4.3 4.2  CL 112* 110 112* 108 107  CO2 21* 20* 22 25 23   GLUCOSE 223* 146* 133* 147* 100*  BUN 29* 34* 34* 32* 32*  CREATININE 1.78* 1.74* 1.78* 1.94* 2.17*  CALCIUM 8.9 8.9 8.8* 9.1 9.3  MG  --   --  2.0  --   --    GFR: Estimated Creatinine Clearance: 30.6 mL/min (A) (by C-G formula based on SCr of 2.17 mg/dL (H)). Liver Function Tests: No results for input(s): AST, ALT, ALKPHOS, BILITOT, PROT, ALBUMIN in the last 168 hours. No results for input(s): LIPASE, AMYLASE in the last 168 hours. No results for input(s): AMMONIA in the last 168 hours. Coagulation Profile: No results for input(s): INR, PROTIME in the last 168 hours. Cardiac Enzymes: No results for input(s): CKTOTAL, CKMB, CKMBINDEX, TROPONINI in the last 168 hours. BNP (last 3 results) No results for input(s): PROBNP in the last 8760 hours. HbA1C: No results for input(s): HGBA1C in the last 72 hours. CBG: Recent Labs  Lab 09/20/19 1104 09/20/19 1623 09/20/19 2205 09/21/19 0551 09/21/19 1107  GLUCAP 131* 147* 133* 91 97   Lipid Profile: No  results for input(s): CHOL, HDL, LDLCALC, TRIG, CHOLHDL, LDLDIRECT in the last 72 hours. Thyroid Function Tests: No results for input(s): TSH, T4TOTAL, FREET4, T3FREE, THYROIDAB in the last 72 hours. Anemia Panel: No results for input(s): VITAMINB12, FOLATE, FERRITIN, TIBC, IRON, RETICCTPCT in the last 72 hours.    Radiology Studies: I have reviewed all of the imaging during this hospital visit personally     Scheduled Meds: . aspirin EC  81 mg Oral Daily  . atorvastatin  80 mg Oral q1800  . fluticasone furoate-vilanterol  1 puff Inhalation Daily   And  . umeclidinium bromide  1 puff Inhalation Daily  . heparin  5,000 Units Subcutaneous Q8H  . insulin aspart  0-15 Units Subcutaneous TID WC  . insulin glargine  18 Units Subcutaneous QHS  . isosorbide-hydrALAZINE  1 tablet Oral TID  . loratadine  10 mg Oral Daily  . metoprolol succinate  50 mg Oral Daily  . potassium chloride  40 mEq Oral BID  . prasugrel  10 mg Oral Daily  . tamsulosin  0.4 mg Oral QPC supper   Continuous Infusions:  Total time spent 29 minutes  LOS: 6 days   Darliss Cheney, MD [facility]

## 2019-09-21 NOTE — Progress Notes (Signed)
Dr. Jacqulyn Bath notified that patient has not had any urine output today.

## 2019-09-22 LAB — BASIC METABOLIC PANEL
Anion gap: 14 (ref 5–15)
BUN: 34 mg/dL — ABNORMAL HIGH (ref 8–23)
CO2: 22 mmol/L (ref 22–32)
Calcium: 9.4 mg/dL (ref 8.9–10.3)
Chloride: 105 mmol/L (ref 98–111)
Creatinine, Ser: 2.29 mg/dL — ABNORMAL HIGH (ref 0.61–1.24)
GFR calc Af Amer: 32 mL/min — ABNORMAL LOW (ref >60)
GFR calc non Af Amer: 28 mL/min — ABNORMAL LOW (ref >60)
Glucose, Bld: 124 mg/dL — ABNORMAL HIGH (ref 70–99)
Potassium: 5.1 mmol/L (ref 3.5–5.1)
Sodium: 141 mmol/L (ref 135–145)

## 2019-09-22 LAB — GLUCOSE, CAPILLARY: Glucose-Capillary: 111 mg/dL — ABNORMAL HIGH (ref 70–99)

## 2019-09-22 MED ORDER — TORSEMIDE 100 MG PO TABS: 50.0000 mg | ORAL_TABLET | Freq: Every day | ORAL | 0 refills | Status: AC | PRN

## 2019-09-22 MED ORDER — ISOSORB DINITRATE-HYDRALAZINE 20-37.5 MG PO TABS: 1.0000 | ORAL_TABLET | Freq: Three times a day (TID) | ORAL | 0 refills | Status: AC

## 2019-09-22 NOTE — Progress Notes (Signed)
Physical Therapy Treatment Patient Details Name: Mark Fernandez MRN: [id number] DOB: 11-21-1950 Today's Date: 09/22/2019    History of Present Illness Pt is a 69 y.o. male with PMH of HTN, HLD, CAD s/p CABG in 2007 and s/p 2 DES in 11/2018, NSVT, DM type II, systolic CHF last LVEF 25-30%, who presents to ED on 09/14/19 with complaints of 2 days of chest pain and shortness of breath. TEE 3/29 reveals severe MR, moderate TR, LVEF 25-30%.   PT Comments    Pt hopeful for d/c home today, focused on being ready to leave. Moving independently in room without DME; declined gait training with rollator. Able to perform seated exercise and repeated sit<>stand independently without UE support. Educ re: activity recommendations (strengthening, walking program), energy conservation, edema control. SpO2 98% on RA, HR 90   Follow Up Recommendations  Home health PT;Supervision - Intermittent     Equipment Recommendations  (rollator in room)    Recommendations for Other Services       Precautions / Restrictions Precautions Precautions: None Restrictions Weight Bearing Restrictions: No    Mobility  Bed Mobility               General bed mobility comments: up in recliner upon entry   Transfers Overall transfer level: Independent Equipment used: None             General transfer comment: Able to perform repeated sit<>stands without UE support  Ambulation/Gait Ambulation/Gait assistance: Independent Gait Distance (Feet): 64 Feet Assistive device: None Gait Pattern/deviations: Step-through pattern;Decreased stride length   Gait velocity interpretation: 1.31 - 2.62 ft/sec, indicative of limited community ambulator General Gait Details: Slow, steady gait, independent without DME; not interested in gait training with rollator. Focused on wanting to get home   Stairs Stairs: (pt declined stair training)           Wheelchair Mobility    Modified Rankin (Stroke Patients  Only)       Balance Overall balance assessment: No apparent balance deficits (not formally assessed)                                          Cognition Arousal/Alertness: Awake/alert Behavior During Therapy: WFL for tasks assessed/performed Overall Cognitive Status: Within Functional Limits for tasks assessed                                 General Comments: WFL for simple tasks this session, focused on wanting to get home      Exercises      General Comments General comments (skin integrity, edema, etc.): SpO2 98% on RA, HR 90 with ambulation      Pertinent Vitals/Pain Pain Assessment: No/denies pain    Home Living                      Prior Function            PT Goals (current goals can now be found in the care plan section) Progress towards PT goals: Progressing toward goals    Frequency    Min 3X/week      PT Plan Current plan remains appropriate    Co-evaluation              AM-PAC PT "6 Clicks" Mobility   Outcome Measure  Help  needed turning from your back to your side while in a flat bed without using bedrails?: None Help needed moving from lying on your back to sitting on the side of a flat bed without using bedrails?: None Help needed moving to and from a bed to a chair (including a wheelchair)?: None Help needed standing up from a chair using your arms (e.g., wheelchair or bedside chair)?: None Help needed to walk in hospital room?: None Help needed climbing 3-5 steps with a railing? : A Little 6 Click Score: 23    End of Session   Activity Tolerance: Patient tolerated treatment well Patient left: in chair;with call bell/phone within reach Nurse Communication: Mobility status PT Visit Diagnosis: Difficulty in walking, not elsewhere classified (R26.2);Other abnormalities of gait and mobility (R26.89)     Time: 9147-8295 PT Time Calculation (min) (ACUTE ONLY): 10 min  Charges:  $Therapeutic  Exercise: 8-22 mins                    Mabeline Caras, PT, DPT Acute Rehabilitation Services  Pager [phone] Office [place] 09/22/2019, 8:47 AM

## 2019-09-22 NOTE — Progress Notes (Signed)
Rn Re-educated patient about importance of using the urinal to measure output.

## 2019-09-22 NOTE — Progress Notes (Signed)
Vincente Liberty to be D/C'd Home per MD order.  Discussed with the patient and all questions fully answered.   VSS, Skin clean, dry and intact without evidence of skin break down, no evidence of skin tears noted. IV catheter discontinued intact. Site without signs and symptoms of complications. Dressing and pressure applied.   An After Visit Summary was printed and given to the patient.    D/C education completed with patient/family including follow up instructions, medication list, d/c activities limitations if indicated, with other d/c instructions as indicated by MD - patient able to verbalize understanding, all questions fully answered.    Patient instructed to return to ED, call 911, or call MD for any changes in condition.    Patient escorted via WC, and D/C home via private car.Marland Kitchen

## 2019-09-22 NOTE — Discharge Instructions (Signed)
Heart Failure, Self Care Heart failure is a serious condition. This sheet explains things you need to do to take care of yourself at home. To help you stay as healthy as possible, you may be asked to change your diet, take certain medicines, and make other changes in your life. Your doctor may also give you more specific instructions. If you have problems or questions, call your doctor. What are the risks? Having heart failure makes it more likely for you to have some problems. These problems can get worse if you do not take good care of yourself. Problems may include:  Blood clotting problems. This may cause a stroke.  Damage to the kidneys, liver, or lungs.  Abnormal heart rhythms. Supplies needed:  Scale for weighing yourself.  Blood pressure monitor.  Notebook.  Medicines. How to care for yourself when you have heart failure Medicines Take over-the-counter and prescription medicines only as told by your doctor. Take your medicines every day.  Do not stop taking your medicine unless your doctor tells you to do so.  Do not skip any medicines.  Get your prescriptions refilled before you run out of medicine. This is important. Eating and drinking   Eat heart-healthy foods. Talk with a diet specialist (dietitian) to create an eating plan.  Choose foods that: ? Have no trans fat. ? Are low in saturated fat and cholesterol.  Choose healthy foods, such as: ? Fresh or frozen fruits and vegetables. ? Fish. ? Low-fat (lean) meats. ? Legumes, such as beans, peas, and lentils. ? Fat-free or low-fat dairy products. ? Whole-grain foods. ? High-fiber foods.  Limit salt (sodium) if told by your doctor. Ask your diet specialist to tell you which seasonings are healthy for your heart.  Cook in healthy ways instead of frying. Healthy ways of cooking include roasting, grilling, broiling, baking, poaching, steaming, and stir-frying.  Limit how much fluid you drink, if told by your  doctor. Alcohol use  Do not drink alcohol if: ? Your doctor tells you not to drink. ? Your heart was damaged by alcohol, or you have very bad heart failure. ? You are pregnant, may be pregnant, or are planning to become pregnant.  If you drink alcohol: ? Limit how much you use to:  0-1 drink a day for women.  0-2 drinks a day for men. ? Be aware of how much alcohol is in your drink. In the [place] one drink equals one 12 oz bottle of beer (355 mL), one 5 oz glass of wine (148 mL), or one 1 oz glass of hard liquor (44 mL). Lifestyle   Do not use any products that contain nicotine or tobacco, such as cigarettes, e-cigarettes, and chewing tobacco. If you need help quitting, ask your doctor. ? Do not use nicotine gum or patches before talking to your doctor.  Do not use illegal drugs.  Lose weight if told by your doctor.  Do physical activity if told by your doctor. Talk to your doctor before you begin an exercise if: ? You are an older adult. ? You have very bad heart failure.  Learn to manage stress. If you need help, ask your doctor.  Get rehab (rehabilitation) to help you stay independent and to help with your quality of life.  Plan time to rest when you get tired. Check weight and blood pressure   Weigh yourself every day. This will help you to know if fluid is building up in your body. ? Weigh yourself every morning   after you pee (urinate) and before you eat breakfast. ? Wear the same amount of clothing each time. ? Write down your daily weight. Give your record to your doctor.  Check and write down your blood pressure as told by your doctor.  Check your pulse as told by your doctor. Dealing with very hot and very cold weather  If it is very hot: ? Avoid activities that take a lot of energy. ? Use air conditioning or fans, or find a cooler place. ? Avoid caffeine and alcohol. ? Wear clothing that is loose-fitting, lightweight, and light-colored.  If it is very  cold: ? Avoid activities that take a lot of energy. ? Layer your clothes. ? Wear mittens or gloves, a hat, and a scarf when you go outside. ? Avoid alcohol. Follow these instructions at home:  Stay up to date with shots (vaccines). Get pneumococcal and flu (influenza) shots.  Keep all follow-up visits as told by your doctor. This is important. Contact a doctor if:  You gain weight quickly.  You have increasing shortness of breath.  You cannot do your normal activities.  You get tired easily.  You cough a lot.  You don't feel like eating or feel like you may vomit (nauseous).  You become puffy (swell) in your hands, feet, ankles, or belly (abdomen).  You cannot sleep well because it is hard to breathe.  You feel like your heart is beating fast (palpitations).  You get dizzy when you stand up. Get help right away if:  You have trouble breathing.  You or someone else notices a change in your behavior, such as having trouble staying awake.  You have chest pain or discomfort.  You pass out (faint). These symptoms may be an emergency. Do not wait to see if the symptoms will go away. Get medical help right away. Call your local emergency services (911 in the [place] Do not drive yourself to the hospital. Summary  Heart failure is a serious condition. To care for yourself, you may have to change your diet, take medicines, and make other lifestyle changes.  Take your medicines every day. Do not stop taking them unless your doctor tells you to do so.  Eat heart-healthy foods, such as fresh or frozen fruits and vegetables, fish, lean meats, legumes, fat-free or low-fat dairy products, and whole-grain or high-fiber foods.  Ask your doctor if you can drink alcohol. You may have to stop alcohol use if you have very bad heart failure.  Contact your doctor if you gain weight quickly or feel that your heart is beating too fast. Get help right away if you pass out, or have chest pain  or trouble breathing. This information is not intended to replace advice given to you by your health care provider. Make sure you discuss any questions you have with your health care provider. Document Revised: 09/20/2018 Document Reviewed: 09/21/2018 [employer] Patient Education  2020 [employer]  

## 2019-09-22 NOTE — Progress Notes (Signed)
Cardiology Progress Note  Patient ID: CYPRIAN GONGAWARE MRN: [id number] DOB: 09-21-1950 Date of Encounter: 09/22/2019  Primary Cardiologist: Reatha Harps, MD  Subjective  Cr pending. Doing well. No symptoms of HF.   ROS:  All other ROS reviewed and negative. Pertinent positives noted in the HPI.     Inpatient Medications  Scheduled Meds: . aspirin EC  81 mg Oral Daily  . atorvastatin  80 mg Oral q1800  . fluticasone furoate-vilanterol  1 puff Inhalation Daily   And  . umeclidinium bromide  1 puff Inhalation Daily  . heparin  5,000 Units Subcutaneous Q8H  . insulin aspart  0-15 Units Subcutaneous TID WC  . insulin glargine  18 Units Subcutaneous QHS  . isosorbide-hydrALAZINE  1 tablet Oral TID  . loratadine  10 mg Oral Daily  . metoprolol succinate  50 mg Oral Daily  . potassium chloride  40 mEq Oral BID  . prasugrel  10 mg Oral Daily  . tamsulosin  0.4 mg Oral QPC supper   Continuous Infusions:  PRN Meds: acetaminophen, albuterol, alum & mag hydroxide-simeth, ondansetron (ZOFRAN) IV   Vital Signs   Vitals:   09/21/19 1735 09/21/19 2128 09/22/19 0613 09/22/19 0803  BP: (!) 108/58 111/64 119/70   Pulse: 82 81 81 85  Resp: 18 18 17 16   Temp: 97.8 F (36.6 C) 98.1 F (36.7 C) 98.1 F (36.7 C)   TempSrc: Oral Oral Oral   SpO2: 99% 98% 99% 99%  Weight:      Height:        Intake/Output Summary (Last 24 hours) at 09/22/2019 0806 Last data filed at 09/22/2019 0300 Gross per 24 hour  Intake 600 ml  Output 200 ml  Net 400 ml   Last 3 Weights 09/21/2019 09/20/2019 09/18/2019  Weight (lbs) 187 lb 197 lb 12 oz 198 lb 4.8 oz  Weight (kg) 84.823 kg 89.7 kg 89.948 kg      Telemetry  Overnight telemetry shows SR, which I personally reviewed.   Physical Exam   Vitals:   09/21/19 1735 09/21/19 2128 09/22/19 0613 09/22/19 0803  BP: (!) 108/58 111/64 119/70   Pulse: 82 81 81 85  Resp: 18 18 17 16   Temp: 97.8 F (36.6 C) 98.1 F (36.7 C) 98.1 F (36.7 C)   TempSrc:  Oral Oral Oral   SpO2: 99% 98% 99% 99%  Weight:      Height:         Intake/Output Summary (Last 24 hours) at 09/22/2019 0806 Last data filed at 09/22/2019 0300 Gross per 24 hour  Intake 600 ml  Output 200 ml  Net 400 ml    Last 3 Weights 09/21/2019 09/20/2019 09/18/2019  Weight (lbs) 187 lb 197 lb 12 oz 198 lb 4.8 oz  Weight (kg) 84.823 kg 89.7 kg 89.948 kg    Body mass index is 32.1 kg/m.   General: Well nourished, well developed, in no acute distress Head: Atraumatic, normal size  Eyes: PEERLA, EOMI  Neck: Supple, JVD 5-7 cm H20 Endocrine: No thryomegaly Cardiac: 3/6 HSM Lungs: Clear to auscultation bilaterally, no wheezing, rhonchi or rales  Abd: Soft, nontender, no hepatomegaly  Ext: trace edema  Musculoskeletal: No deformities, BUE and BLE strength normal and equal Skin: Warm and dry, no rashes   Neuro: Alert and oriented to person, place, time, and situation, CNII-XII grossly intact, no focal deficits  Psych: Normal mood and affect   Labs  High Sensitivity Troponin:   Recent Labs  Lab  09/14/19 0129 09/14/19 0415  TROPONINIHS 138* 142*     Cardiac EnzymesNo results for input(s): TROPONINI in the last 168 hours. No results for input(s): TROPIPOC in the last 168 hours.  Chemistry Recent Labs  Lab 09/20/19 0544 09/21/19 0351 09/21/19 1130  NA 143 144 143  K 4.3 4.2 4.5  CL 108 107 106  CO2 25 23 24   GLUCOSE 147* 100* 104*  BUN 32* 32* 33*  CREATININE 1.94* 2.17* 2.26*  CALCIUM 9.1 9.3 9.5  GFRNONAA 34* 30* 28*  GFRAA 39* 34* 33*  ANIONGAP 10 14 13     Hematology Recent Labs  Lab 09/16/19 0845  WBC 5.4  RBC 3.22*  HGB 9.5*  HCT 29.1*  MCV 90.4  MCH 29.5  MCHC 32.6  RDW 15.4  PLT 309   BNP Recent Labs  Lab 09/18/19 0733  BNP 945.0*    DDimer No results for input(s): DDIMER in the last 168 hours.   Radiology  No results found.  Cardiac Studies  TTE 09/14/2019 1. Global hypokinesis; septal dyssynergy; overall moderate to severe LV   dysfunction; grade 2 diastolic dysfunction; severe MR; mild LAE.  2. Left ventricular ejection fraction, by estimation, is 30 to 35%. The  left ventricle has moderate to severely decreased function. The left  ventricle demonstrates global hypokinesis. The left ventricular internal  cavity size was mildly dilated. Left  ventricular diastolic parameters are consistent with Grade II diastolic  dysfunction (pseudonormalization). Elevated left atrial pressure.  3. Right ventricular systolic function is normal. The right ventricular  size is normal. There is moderately elevated pulmonary artery systolic  pressure.  4. Left atrial size was mildly dilated.  5. The mitral valve is normal in structure. Severe mitral valve  regurgitation. No evidence of mitral stenosis.  6. The aortic valve is tricuspid. Aortic valve regurgitation is not  visualized. Mild aortic valve sclerosis is present, with no evidence of  aortic valve stenosis.  7. The inferior vena cava is normal in size with greater than 50%  respiratory variability, suggesting right atrial pressure of 3 mmHg.  Patient Profile  69 y.o.malewith hx of CAD s/p CABG (LIMA-LAD, SVG-D1, SVG-OM, SVG-RCA in 2007), EF 45%, SVT, DM, HTN, HLD, former smoker, intolerant to statins who was admitted 09/14/2019 with CP and SOB. Found to be in acute decompensated HF.  Assessment & Plan   1.  Acute decompensated systolic heart failure, EF 30-35% -EF now 30-35% with severe MR.  BNP 900. Entresto held in the setting of AKI. I/Os positive this admission but not recorded accurately.  -Cr has bumped but he looks euvolemic -holding diuresis. Discharge pending Cr - continue metoprolol succinate 50 mg QD, Bidil TID -CKD so no ACE/ARB/ARNI -EKG with new left bundle and QRS 160.  This is creating significant dyssynchrony resulting in worsening MR and overall worsening heart failure.  His EF was low a year ago.  He will need to be evaluated for a  biventricular ICD.  This will be done as an outpatient. -I will plan for close follow-up with me as outpatient for cath and BiV ICD eval  2.  Dizziness hypotension -Unclear etiology.  Possibly overmedicated. -resolved   3.  AKI -a bit up today -hold diuresis -home today hopefully   4.  CAD status post CABG and recent PCI -Most recent heart cath in June 2020 at [facility] showed patent LIMA to LAD, patent vein graft OM1, patent vein graft RCA.  He did undergo PCI to the mid and distal  left circumflex. -He will remain on aspirin and prasugrel for now. -He is on high intensity statin.  5.  Severe MR -Related to decompensated systolic heart failure and volume overload.  Regurgitant lesions are always worse in the setting of volume overload.  This is not a concern at this time.  I suspect he will ultimately need a biventricular pacer to shorten his QRS given his left bundle.  The MR often improves with that as well.  6.  Left bundle branch block, QRS 160 -This is new and is dyssynchrony is likely worsening his LV function.  He still will need a left and right heart catheterization but now not a time.  We will get him as dry as possible and then have him evaluated for a cath and BiV ICD as an outpatient.  For questions or updates, please contact [facility] Please consult [email or web address] for contact info under   [facility] will sign off.   Medication Recommendations:  Coreg 25 mg BID, Bidil 1 tab TID, torsemide 50 mg QD PRN Other recommendations (labs, testing, etc):  none Follow up as an outpatient:  I will arrange follow up with me in 2-3 weeks   Time Spent with Patient: I have spent a total of 25 minutes with patient reviewing hospital notes, telemetry, EKGs, labs and examining the patient as well as establishing an assessment and plan that was discussed with the patient.  > 50% of time was spent in direct patient care.    Signed, Lenna Gilford. Flora Lipps, MD [facility]  [facility]   09/22/2019 8:06 AM

## 2019-09-22 NOTE — Discharge Summary (Signed)
Physician Discharge Summary  Lucius ConnJames C Dalesandro ZOX:[id number] DOB: 1951/01/24 DOA: 09/14/2019  PCP: Jarome MatinPaterson, Daniel, MD  Admit date: 09/14/2019 Discharge date: 09/22/2019  Admitted From: Home Disposition: Home  Recommendations for Outpatient Follow-up:  1. Follow up with PCP in 1-2 weeks 2. Follow with cardiology in 2 to 3 weeks 3. Please obtain BMP/CBC in one week 4. Please follow up on the following pending results:  Home Health: Yes Equipment/Devices: None  Discharge Condition: Stable CODE STATUS: Full code Diet recommendation: Low-sodium  Subjective: Seen and examined.  Feels much better.  No complaints except very minimal exertional dyspnea which is at her baseline.  Brief/Interim Summary: Patient was admitted to the hospital with working diagnosis of atypical chest pain, in the setting of acute on chronic systolic heart failure decompensation and acute kidney injury on chronic kidney disease.  69 year old male who presented with chest pain. He does have significant past medical history for hypertension, dyslipidemia, coronary disease, status post bypass grafting in 2007, angioplasty 2020, type 2 diabetes mellitus and systolic heart failure. On his initial physical examination, 107/65, heart rate 78, respiratory rate17, oxygen saturation 99%. Lungs were clear to auscultation bilaterally, he had a 3/6 systolic murmur the apex, abdomen soft, no lower extremity edema. Sodium 143, potassium 3.7, chloride 109, bicarb 21, glucose 72, BUN 23, creatinine 2.34, white count 6.1, hemoglobin 9.9, hematocrit 30.8, platelets 335.His chest radiograph had mild cardiomegaly, bilateral interstitial infiltrates, fluid in the fissures, andcephalization of vasculature. EKG 74 bpm, left axis deviation, left bundle branch block, manually corrected QTC 494,sinus rhythm, no ST segment or T wave changes. Patientreceived furosemide for signs of volume overload and cardiorenal syndrome.Further workup with  TEEshowed severe mitral regurgitation and ejection fraction of 25 to 30%.  Cardiology was on board.  Patient was started on more aggressive diuresis with furosemide and heart failure management.  Cardiology recommended cardiac catheterization once he was medically optimized however patient decided to have cardiac cath done as outpatient.  His creatinine then started to jump but then stabilized since last 2 days.  Unfortunately, his I's and O's were not accurately charted however patient claims that he has been urinating several times a day.  Part of the reason of not having accurate charting is that patient sometimes is urinating in the toilet bowl instead of urinal.  He is creatinine upon presentation was 2.34.  It improved to 1.64 and then climbed back to 2.29 today.  I personally discussed rising creatinine with cardiologist Dr. Demetrios LollLeslie Thomas O'Neill who was comfortable with this and recommended discharging home with as needed torsemide and rest of the medications which include carvedilol, aspirin, atorvastatin and BiDil and discontinue lisinopril.  Patient is being discharged in stable condition and cardiology will follow up with him er in 2 to 3 weeks.   Discharge Diagnoses:  Principal Problem:   Acute on chronic systolic CHF (congestive heart failure) (HCC) Active Problems:   Coronary artery disease   Diabetes (HCC)   Acute kidney injury superimposed on chronic kidney disease (HCC)   Obesity (BMI 30.0-34.9)   Normocytic anemia   Dyspnea on minimal exertion    Discharge Instructions   Allergies as of 09/22/2019   No Known Allergies     Medication List    STOP taking these medications   Entresto 49-51 MG Generic drug: sacubitril-valsartan   furosemide 40 MG tablet Commonly known as: LASIX   lisinopril 20 MG tablet Commonly known as: ZESTRIL   metoprolol succinate 50 MG 24 hr tablet Commonly known as:  TOPROL-XL     TAKE these medications   albuterol 108 (90 Base) MCG/ACT  inhaler Commonly known as: VENTOLIN HFA Inhale 2 puffs into the lungs every 4 (four) hours as needed for wheezing or shortness of breath.   aspirin 81 MG EC tablet Take 1 tablet (81 mg total) by mouth daily.   atorvastatin 80 MG tablet Commonly known as: LIPITOR Take 1 tablet (80 mg total) by mouth daily at 6 PM.   carvedilol 12.5 MG tablet Commonly known as: COREG Take 1 tablet (12.5 mg total) by mouth 2 (two) times daily with a meal.   cetirizine 10 MG tablet Commonly known as: ZYRTEC Take 10 mg by mouth daily.   insulin glargine 100 UNIT/ML injection Commonly known as: LANTUS Inject 55 Units into the skin at bedtime.   isosorbide mononitrate 30 MG 24 hr tablet Commonly known as: IMDUR Take 1 tablet (30 mg total) by mouth daily.   isosorbide-hydrALAZINE 20-37.5 MG tablet Commonly known as: BIDIL Take 1 tablet by mouth 3 (three) times daily.   liraglutide 18 MG/3ML Sopn Commonly known as: VICTOZA Inject 1.2 mg into the skin daily.   meclizine 12.5 MG tablet Commonly known as: ANTIVERT Take 1 tablet (12.5 mg total) by mouth 2 (two) times daily as needed for dizziness.   multivitamin with minerals tablet Take 1 tablet by mouth daily.   nitroGLYCERIN 0.4 MG SL tablet Commonly known as: NITROSTAT Place 1 tablet (0.4 mg total) under the tongue every 5 (five) minutes x 3 doses as needed for chest pain.   pantoprazole 40 MG tablet Commonly known as: PROTONIX Take 1 tablet (40 mg total) by mouth daily.   potassium chloride SA 20 MEQ tablet Commonly known as: KLOR-CON Take 20 mEq by mouth daily.   prasugrel 10 MG Tabs tablet Commonly known as: EFFIENT Take 10 mg by mouth daily.   tamsulosin 0.4 MG Caps capsule Commonly known as: Flomax Take 1 capsule (0.4 mg total) by mouth daily after supper.   torsemide 100 MG tablet Commonly known as: DEMADEX Take 0.5 tablets (50 mg total) by mouth daily as needed (Weight gain/lower extremity edema/dyspnea).   Trelegy  Ellipta 100-62.5-25 MCG/INH Aepb Generic drug: Fluticasone-Umeclidin-Vilant Inhale 1 puff into the lungs daily.            Durable Medical Equipment  (From admission, onward)         Start     Ordered   09/20/19 1320  For home use only DME Bedside commode  Once    Question:  Patient needs a bedside commode to treat with the following condition  Answer:  Balance disorder   09/20/19 1320   09/18/19 1648  For home use only DME 4 wheeled rolling walker with seat  Once    Question:  Patient needs a walker to treat with the following condition  Answer:  Weakness   09/18/19 1647         Follow-up Information    [facility] Follow up.   Specialty: Home Health Services Contact information: [address] [place] [address] [phone]        Beatriz Stallion., PA-C Follow up on 10/16/2019.   Specialty: Physician Assistant Why: Please arrive 15 minutes early for your 10:15am post-hospital cardiology appointment Contact information: [address] [place] [address] [place] [address] [phone]        Jarome Matin, MD Follow up in 1 week(s).   Specialty: Internal Medicine Contact information: [id number]  [address] [place] [address] [phone]        O'Neal, Ronnald Ramp, MD .   Specialties: Internal Medicine, Cardiology, Radiology Contact information: [address] [place] [address] [phone]          No Known Allergies  Consultations: Cardiology   Procedures/Studies: DG Chest 1 View  Result Date: 09/17/2019 CLINICAL DATA:  Respiratory distress. EXAM: CHEST  1 VIEW COMPARISON:  Chest radiograph 09/14/2019 FINDINGS: Monitoring leads overlie the patient. Stable cardiomegaly status post median sternotomy and CABG procedure. No consolidative pulmonary opacities. No pleural effusion or pneumothorax. IMPRESSION: Cardiomegaly. No acute cardiopulmonary process. Electronically Signed   By: Annia Belt M.D.   On:  09/17/2019 14:34   DG Chest 2 View  Result Date: 09/14/2019 CLINICAL DATA:  Chest pain EXAM: CHEST - 2 VIEW COMPARISON:  May 25, 2018 FINDINGS: The heart size and mediastinal contours are mildly enlarged. Aortic knob calcifications. Overlying median sternotomy wires. No large airspace consolidation or pleural effusion. No acute osseous abnormality. IMPRESSION: No active cardiopulmonary disease. Electronically Signed   By: Jonna Clark M.D.   On: 09/14/2019 02:19   ECHOCARDIOGRAM COMPLETE  Result Date: 09/14/2019    ECHOCARDIOGRAM REPORT   Patient Name:   Mark Fernandez Date of Exam: 09/14/2019 Medical Rec #:  [id number]       Height:       64.0 in Accession #:    [id number]      Weight:       195.0 lb Date of Birth:  Nov 11, 1950       BSA:          1.935 m Patient Age:    68 years        BP:           107/88 mmHg Patient Gender: M               HR:           73 bpm. Exam Location:  Inpatient Procedure: 2D Echo, Cardiac Doppler and Color Doppler Indications:    Chest pain  History:        Patient has prior history of Echocardiogram examinations, most                 recent 12/10/2015. Cardiomyopathy, CAD and Previous Myocardial                 Infarction, Prior CABG, Arrythmias:NSVT; Risk Factors:Diabetes,                 Dyslipidemia, Hypertension and Current Smoker.  Sonographer:    Lavenia Atlas Referring Phys: [phone] RONDELL A SMITH IMPRESSIONS  1. Global hypokinesis; septal dyssynergy; overall moderate to severe LV dysfunction; grade 2 diastolic dysfunction; severe MR; mild LAE.  2. Left ventricular ejection fraction, by estimation, is 30 to 35%. The left ventricle has moderate to severely decreased function. The left ventricle demonstrates global hypokinesis. The left ventricular internal cavity size was mildly dilated. Left ventricular diastolic parameters are consistent with Grade II diastolic dysfunction (pseudonormalization). Elevated left atrial pressure.  3. Right ventricular systolic  function is normal. The right ventricular size is normal. There is moderately elevated pulmonary artery systolic pressure.  4. Left atrial size was mildly dilated.  5. The mitral valve is normal in structure. Severe mitral valve regurgitation. No evidence of mitral stenosis.  6. The aortic valve is tricuspid. Aortic valve regurgitation is not visualized. Mild aortic valve sclerosis is present, with no evidence of aortic valve stenosis.  7. The inferior vena cava is normal in size with greater than 50% respiratory variability, suggesting right atrial pressure of 3 mmHg. FINDINGS  Left Ventricle: Left ventricular ejection fraction, by estimation, is 30 to 35%. The left ventricle has moderate to severely decreased function. The left ventricle demonstrates global hypokinesis. The left ventricular internal cavity size was mildly dilated. There is no left ventricular hypertrophy. Left ventricular diastolic parameters are consistent with Grade II diastolic dysfunction (pseudonormalization). Elevated left atrial pressure. Right Ventricle: The right ventricular size is normal. Right ventricular systolic function is normal. There is moderately elevated pulmonary artery systolic pressure. The tricuspid regurgitant velocity is 2.97 m/s, and with an assumed right atrial pressure of 8 mmHg, the estimated right ventricular systolic pressure is 43.3 mmHg. Left Atrium: Left atrial size was mildly dilated. Right Atrium: Right atrial size was normal in size. Pericardium: There is no evidence of pericardial effusion. Mitral Valve: The mitral valve is normal in structure. Normal mobility of the mitral valve leaflets. Mild mitral annular calcification. Severe mitral valve regurgitation. No evidence of mitral valve stenosis. Tricuspid Valve: The tricuspid valve is normal in structure. Tricuspid valve regurgitation is mild . No evidence of tricuspid stenosis. Aortic Valve: The aortic valve is tricuspid. Aortic valve regurgitation is not  visualized. Mild aortic valve sclerosis is present, with no evidence of aortic valve stenosis. Pulmonic Valve: The pulmonic valve was normal in structure. Pulmonic valve regurgitation is trivial. No evidence of pulmonic stenosis. Aorta: The aortic root is normal in size and structure. Venous: The inferior vena cava is normal in size with greater than 50% respiratory variability, suggesting right atrial pressure of 3 mmHg. IAS/Shunts: No atrial level shunt detected by color flow Doppler. Additional Comments: Global hypokinesis; septal dyssynergy; overall moderate to severe LV dysfunction; grade 2 diastolic dysfunction; severe MR; mild LAE.  LEFT VENTRICLE PLAX 2D LVIDd:         5.42 cm  Diastology LVIDs:         4.93 cm  LV e' lateral:   7.08 cm/s LV PW:         1.27 cm  LV E/e' lateral: 15.4 LV IVS:        1.29 cm  LV e' medial:    3.16 cm/s LVOT diam:     2.00 cm  LV E/e' medial:  34.5 LV SV:         45 LV SV Index:   23 LVOT Area:     3.14 cm  RIGHT VENTRICLE RV Basal diam:  2.54 cm RV S prime:     4.74 cm/s TAPSE (M-mode): 2.1 cm LEFT ATRIUM           Index       RIGHT ATRIUM           Index LA diam:      4.30 cm 2.22 cm/m  RA Area:     13.40 cm LA Vol (A4C): 68.9 ml 35.60 ml/m RA Volume:   30.90 ml  15.97 ml/m  AORTIC VALVE LVOT Vmax:   74.20 cm/s LVOT Vmean:  49.000 cm/s LVOT VTI:    0.142 m  AORTA Ao Root diam: 2.70 cm MITRAL VALVE                TRICUSPID VALVE MV Area (PHT): 3.31 cm     TR Peak grad:   35.3 mmHg MV Decel Time: 229 msec     TR Vmax:        297.00 cm/s MV  E velocity: 109.00 cm/s MV A velocity: 57.40 cm/s   SHUNTS MV E/A ratio:  1.90         Systemic VTI:  0.14 m                             Systemic Diam: 2.00 cm Olga Millers MD Electronically signed by Olga Millers MD Signature Date/Time: 09/14/2019/1:08:11 PM    Final    ECHO TEE  Result Date: 09/18/2019    TRANSESOPHOGEAL ECHO REPORT   Patient Name:   Mark Fernandez Date of Exam: 09/18/2019 Medical Rec #:  [id number]        Height:       64.0 in Accession #:    [id number]      Weight:       198.3 lb Date of Birth:  22-Nov-1950       BSA:          1.949 m Patient Age:    68 years        BP:           125/68 mmHg Patient Gender: M               HR:           74 bpm. Exam Location:  Inpatient Procedure: Transesophageal Echo, Color Doppler and Cardiac Doppler Indications:     mitral insufficiency 201046  History:         Patient has prior history of Echocardiogram examinations, most                  recent 09/14/2019. Cardiomyopathy, CAD, Prior CABG; Risk                  Factors:Hypertension and Diabetes.  Sonographer:     Thurman Coyer RDCS (AE) Referring Phys:  [id number] Francee Nodal FURTH Diagnosing Phys: Zoila Shutter MD PROCEDURE: After discussion of the risks and benefits of a TEE, an informed consent was obtained from the patient. The transesophogeal probe was passed without difficulty through the esophogus of the patient. Imaged were obtained with the patient in a supine position. Sedation performed by different physician. The patient was monitored while under deep sedation. Anesthestetic sedation was provided intravenously by Anesthesiology:  of Propofol. Image quality was excellent. The patient's vital signs; including heart rate, blood pressure, and oxygen saturation; remained stable throughout the procedure. The patient developed no complications during the procedure. IMPRESSIONS  1. Left ventricular ejection fraction, by estimation, is 25 to 30%. The left ventricle has severely decreased function. The left ventricle demonstrates global hypokinesis. The left ventricular internal cavity size was mildly dilated. There is moderate left ventricular hypertrophy.  2. Right ventricular systolic function mild to moderately reduced. The right ventricular size is normal.  3. Left atrial size was mildly dilated. No left atrial/left atrial appendage thrombus was detected.  4. Mild mitral annular dilatation.  5. The mitral valve is  abnormal. Severe mitral valve regurgitation.  6. The tricuspid valve is abnormal. Tricuspid valve regurgitation is moderate.  7. The aortic valve is tricuspid. Aortic valve regurgitation is not visualized. FINDINGS  Left Ventricle: Left ventricular ejection fraction, by estimation, is 25 to 30%. The left ventricle has severely decreased function. The left ventricle demonstrates global hypokinesis. The left ventricular internal cavity size was mildly dilated. There is moderate left ventricular hypertrophy. Abnormal (paradoxical) septal motion, consistent with left bundle branch block. Right Ventricle: The right ventricular size  is normal. No increase in right ventricular wall thickness. Right ventricular systolic function mild to moderately reduced. Left Atrium: Left atrial size was mildly dilated. Spontaneous echo contrast was present in the left atrium. No left atrial/left atrial appendage thrombus was detected. Right Atrium: Right atrial size was normal in size. Pericardium: There is no evidence of pericardial effusion. Mitral Valve: The mitral valve is abnormal. There is mild thickening of the mitral valve leaflet(s). Mild mitral annular calcification. Mild mitral annular dilatation. Severe mitral valve regurgitation, with centrally-directed jet. Pulmonary venous flow shows systolic flow reversal. Tricuspid Valve: The tricuspid valve is abnormal. Tricuspid valve regurgitation is moderate. RVSP is moderately elevated at 45 mmHg + RAP. Aortic Valve: The aortic valve is tricuspid. Aortic valve regurgitation is not visualized. Pulmonic Valve: The pulmonic valve was grossly normal. Pulmonic valve regurgitation is trivial. Aorta: The aortic root and ascending aorta are structurally normal, with no evidence of dilitation. Venous: The left upper pulmonary vein and left lower pulmonary vein are normal. A pattern of systolic flow reversal, suggestive of severe mitral regurgitation is recorded from the left upper pulmonary  vein. IAS/Shunts: No atrial level shunt detected by color flow Doppler.  MR Peak grad:    176.4 mmHg  TRICUSPID VALVE MR Mean grad:    118.0 mmHg  TR Peak grad:   44.6 mmHg MR Vmax:         664.00 cm/s TR Vmax:        334.00 cm/s MR Vmean:        505.0 cm/s MR PISA:         4.36 cm MR PISA Eff ROA: 25 mm MR PISA Radius:  0.83 cm Lyman Bishop MD Electronically signed by Lyman Bishop MD Signature Date/Time: 09/18/2019/11:04:24 AM    Final       Discharge Exam: Vitals:   09/22/19 7893 09/22/19 0803  BP: 119/70   Pulse: 81 85  Resp: 17 16  Temp: 98.1 F (36.7 C)   SpO2: 99% 99%   Vitals:   09/21/19 1735 09/21/19 2128 09/22/19 0613 09/22/19 0803  BP: (!) 108/58 111/64 119/70   Pulse: 82 81 81 85  Resp: 18 18 17 16   Temp: 97.8 F (36.6 C) 98.1 F (36.7 C) 98.1 F (36.7 C)   TempSrc: Oral Oral Oral   SpO2: 99% 98% 99% 99%  Weight:      Height:        General: Pt is alert, awake, not in acute distress Cardiovascular: RRR, S1/S2 +, no rubs, no gallops Respiratory: CTA bilaterally, no wheezing, no rhonchi Abdominal: Soft, NT, ND, bowel sounds + Extremities: no edema, no cyanosis    The results of significant diagnostics from this hospitalization (including imaging, microbiology, ancillary and laboratory) are listed below for reference.     Microbiology: Recent Results (from the past 240 hour(s))  SARS CORONAVIRUS 2 (TAT 6-24 HRS) Nasopharyngeal Nasopharyngeal Swab     Status: None   Collection Time: 09/14/19  4:02 AM   Specimen: Nasopharyngeal Swab  Result Value Ref Range Status   SARS Coronavirus 2 NEGATIVE NEGATIVE Final    Comment: (NOTE) SARS-CoV-2 target nucleic acids are NOT DETECTED. The SARS-CoV-2 RNA is generally detectable in upper and lower respiratory specimens during the acute phase of infection. Negative results do not preclude SARS-CoV-2 infection, do not rule out co-infections with other pathogens, and should not be used as the sole basis for treatment  or other patient management decisions. Negative results must be combined with clinical observations,  patient history, and epidemiological information. The expected result is Negative. Fact Sheet for Patients: HairSlick.no Fact Sheet for Healthcare Providers: quierodirigir.com This test is not yet approved or cleared by the [place] FDA and  has been authorized for detection and/or diagnosis of SARS-CoV-2 by FDA under an Emergency Use Authorization (EUA). This EUA will remain  in effect (meaning this test can be used) for the duration of the COVID-19 declaration under Section 56 4(b)(1) of the Act, 21 U.S.C. section 360bbb-3(b)(1), unless the authorization is terminated or revoked sooner. Performed at [facility], [address], [place], [place] [address]      Labs: BNP (last 3 results) Recent Labs    09/18/19 0733  BNP 945.0*   Basic Metabolic Panel: Recent Labs  Lab 09/19/19 0620 09/20/19 0544 09/21/19 0351 09/21/19 1130 09/22/19 0645  NA 144 143 144 143 141  K 3.9 4.3 4.2 4.5 5.1  CL 112* 108 107 106 105  CO2 22 25 23 24 22   GLUCOSE 133* 147* 100* 104* 124*  BUN 34* 32* 32* 33* 34*  CREATININE 1.78* 1.94* 2.17* 2.26* 2.29*  CALCIUM 8.8* 9.1 9.3 9.5 9.4  MG 2.0  --   --   --   --    Liver Function Tests: No results for input(s): AST, ALT, ALKPHOS, BILITOT, PROT, ALBUMIN in the last 168 hours. No results for input(s): LIPASE, AMYLASE in the last 168 hours. No results for input(s): AMMONIA in the last 168 hours. CBC: Recent Labs  Lab 09/16/19 0845  WBC 5.4  HGB 9.5*  HCT 29.1*  MCV 90.4  PLT 309   Cardiac Enzymes: No results for input(s): CKTOTAL, CKMB, CKMBINDEX, TROPONINI in the last 168 hours. BNP: Invalid input(s): POCBNP CBG: Recent Labs  Lab 09/21/19 0551 09/21/19 1107 09/21/19 1625 09/21/19 2126 09/22/19 0610  GLUCAP 91 97 126* 157* 111*   D-Dimer No results for input(s):  DDIMER in the last 72 hours. Hgb A1c No results for input(s): HGBA1C in the last 72 hours. Lipid Profile No results for input(s): CHOL, HDL, LDLCALC, TRIG, CHOLHDL, LDLDIRECT in the last 72 hours. Thyroid function studies No results for input(s): TSH, T4TOTAL, T3FREE, THYROIDAB in the last 72 hours.  Invalid input(s): FREET3 Anemia work up No results for input(s): VITAMINB12, FOLATE, FERRITIN, TIBC, IRON, RETICCTPCT in the last 72 hours. Urinalysis    Component Value Date/Time   COLORURINE AMBER (A) 09/16/2019 0600   APPEARANCEUR CLOUDY (A) 09/16/2019 0600   LABSPEC 1.028 09/16/2019 0600   PHURINE 5.0 09/16/2019 0600   GLUCOSEU NEGATIVE 09/16/2019 0600   HGBUR LARGE (A) 09/16/2019 0600   BILIRUBINUR NEGATIVE 09/16/2019 0600   KETONESUR NEGATIVE 09/16/2019 0600   PROTEINUR 30 (A) 09/16/2019 0600   NITRITE NEGATIVE 09/16/2019 0600   LEUKOCYTESUR NEGATIVE 09/16/2019 0600   Sepsis Labs Invalid input(s): PROCALCITONIN,  WBC,  LACTICIDVEN Microbiology Recent Results (from the past 240 hour(s))  SARS CORONAVIRUS 2 (TAT 6-24 HRS) Nasopharyngeal Nasopharyngeal Swab     Status: None   Collection Time: 09/14/19  4:02 AM   Specimen: Nasopharyngeal Swab  Result Value Ref Range Status   SARS Coronavirus 2 NEGATIVE NEGATIVE Final    Comment: (NOTE) SARS-CoV-2 target nucleic acids are NOT DETECTED. The SARS-CoV-2 RNA is generally detectable in upper and lower respiratory specimens during the acute phase of infection. Negative results do not preclude SARS-CoV-2 infection, do not rule out co-infections with other pathogens, and should not be used as the sole basis for treatment or other patient  management decisions. Negative results must be combined with clinical observations, patient history, and epidemiological information. The expected result is Negative. Fact Sheet for Patients: HairSlick.no Fact Sheet for Healthcare  Providers: quierodirigir.com This test is not yet approved or cleared by the [place] FDA and  has been authorized for detection and/or diagnosis of SARS-CoV-2 by FDA under an Emergency Use Authorization (EUA). This EUA will remain  in effect (meaning this test can be used) for the duration of the COVID-19 declaration under Section 56 4(b)(1) of the Act, 21 U.S.C. section 360bbb-3(b)(1), unless the authorization is terminated or revoked sooner. Performed at [facility], [address], [place], [place] [address]      Time coordinating discharge: Over 30 minutes  SIGNED:   Hughie Closs, MD  [facility] 09/22/2019, 9:59 AM  If 7PM-7AM, please contact night-coverage [email or web address]

## 2019-09-22 NOTE — Plan of Care (Signed)
Patient eager to get home and understanding of instructions

## 2019-10-12 ENCOUNTER — Ambulatory Visit: Payer: Medicare Other | Admitting: Cardiology

## 2019-10-16 ENCOUNTER — Ambulatory Visit: Payer: Medicare Other | Admitting: Medical

## 2021-08-12 ENCOUNTER — Emergency Department (HOSPITAL_COMMUNITY): Payer: No Typology Code available for payment source

## 2021-08-12 ENCOUNTER — Emergency Department (HOSPITAL_COMMUNITY)
Admission: EM | Admit: 2021-08-12 | Discharge: 2021-08-12 | Disposition: A | Discharge status: Home / Self Care | Payer: No Typology Code available for payment source | Attending: Emergency Medicine | Admitting: Emergency Medicine

## 2021-08-12 DIAGNOSIS — I2510 Atherosclerotic heart disease of native coronary artery without angina pectoris: Secondary | ICD-10-CM | POA: Diagnosis not present

## 2021-08-12 DIAGNOSIS — R197 Diarrhea, unspecified: Secondary | ICD-10-CM | POA: Diagnosis present

## 2021-08-12 DIAGNOSIS — K5732 Diverticulitis of large intestine without perforation or abscess without bleeding: Secondary | ICD-10-CM | POA: Diagnosis not present

## 2021-08-12 DIAGNOSIS — Z79899 Other long term (current) drug therapy: Secondary | ICD-10-CM | POA: Diagnosis not present

## 2021-08-12 DIAGNOSIS — Z20822 Contact with and (suspected) exposure to covid-19: Secondary | ICD-10-CM | POA: Diagnosis not present

## 2021-08-12 DIAGNOSIS — E119 Type 2 diabetes mellitus without complications: Secondary | ICD-10-CM | POA: Insufficient documentation

## 2021-08-12 DIAGNOSIS — I10 Essential (primary) hypertension: Secondary | ICD-10-CM | POA: Diagnosis not present

## 2021-08-12 DIAGNOSIS — Z794 Long term (current) use of insulin: Secondary | ICD-10-CM | POA: Diagnosis not present

## 2021-08-12 DIAGNOSIS — N3090 Cystitis, unspecified without hematuria: Secondary | ICD-10-CM | POA: Insufficient documentation

## 2021-08-12 DIAGNOSIS — K5792 Diverticulitis of intestine, part unspecified, without perforation or abscess without bleeding: Secondary | ICD-10-CM

## 2021-08-12 DIAGNOSIS — R42 Dizziness and giddiness: Secondary | ICD-10-CM | POA: Diagnosis not present

## 2021-08-12 DIAGNOSIS — Z7982 Long term (current) use of aspirin: Secondary | ICD-10-CM | POA: Diagnosis not present

## 2021-08-12 LAB — CBC
HCT: 39.7 % (ref 39.0–52.0)
Hemoglobin: 12.6 g/dL — ABNORMAL LOW (ref 13.0–17.0)
MCH: 28.7 pg (ref 26.0–34.0)
MCHC: 31.7 g/dL (ref 30.0–36.0)
MCV: 90.4 fL (ref 80.0–100.0)
Platelets: 289 10*3/uL (ref 150–400)
RBC: 4.39 MIL/uL (ref 4.22–5.81)
RDW: 14.9 % (ref 11.5–15.5)
WBC: 14.6 10*3/uL — ABNORMAL HIGH (ref 4.0–10.5)
nRBC: 0 % (ref 0.0–0.2)

## 2021-08-12 LAB — RESP PANEL BY RT-PCR (FLU A&B, COVID) ARPGX2
Influenza A by PCR: NEGATIVE
Influenza B by PCR: NEGATIVE
SARS Coronavirus 2 by RT PCR: NEGATIVE

## 2021-08-12 LAB — COMPREHENSIVE METABOLIC PANEL
ALT: 18 U/L (ref 0–44)
AST: 29 U/L (ref 15–41)
Albumin: 3.3 g/dL — ABNORMAL LOW (ref 3.5–5.0)
Alkaline Phosphatase: 78 U/L (ref 38–126)
Anion gap: 11 (ref 5–15)
BUN: 34 mg/dL — ABNORMAL HIGH (ref 8–23)
CO2: 23 mmol/L (ref 22–32)
Calcium: 8.8 mg/dL — ABNORMAL LOW (ref 8.9–10.3)
Chloride: 107 mmol/L (ref 98–111)
Creatinine, Ser: 2.67 mg/dL — ABNORMAL HIGH (ref 0.61–1.24)
GFR, Estimated: 25 mL/min — ABNORMAL LOW (ref >60)
Glucose, Bld: 319 mg/dL — ABNORMAL HIGH (ref 70–99)
Potassium: 3.6 mmol/L (ref 3.5–5.1)
Sodium: 141 mmol/L (ref 135–145)
Total Bilirubin: 0.9 mg/dL (ref 0.3–1.2)
Total Protein: 8.2 g/dL — ABNORMAL HIGH (ref 6.5–8.1)

## 2021-08-12 LAB — URINALYSIS, ROUTINE W REFLEX MICROSCOPIC
Bilirubin Urine: NEGATIVE
Glucose, UA: >=500 mg/dL — AB
Ketones, ur: NEGATIVE mg/dL
Nitrite: NEGATIVE
Protein, ur: NEGATIVE mg/dL
Specific Gravity, Urine: 1.02 (ref 1.005–1.030)
WBC, UA: >50 WBC/hpf — ABNORMAL HIGH (ref 0–5)
pH: 5 (ref 5.0–8.0)

## 2021-08-12 LAB — LIPASE, BLOOD: Lipase: 35 U/L (ref 11–51)

## 2021-08-12 MED ORDER — SODIUM CHLORIDE 0.9 % IV BOLUS
500.0000 mL | Freq: Once | INTRAVENOUS | Status: AC
  Administered 2021-08-12: 500 mL via INTRAVENOUS

## 2021-08-12 MED ORDER — SODIUM CHLORIDE 0.9 % IV SOLN
1.0000 g | Freq: Once | INTRAVENOUS | Status: AC
  Administered 2021-08-12: 1 g via INTRAVENOUS
  Filled 2021-08-12: qty 10

## 2021-08-12 MED ORDER — METRONIDAZOLE 500 MG PO TABS
500.0000 mg | ORAL_TABLET | Freq: Once | ORAL | Status: AC
  Administered 2021-08-12: 500 mg via ORAL
  Filled 2021-08-12: qty 1

## 2021-08-12 MED ORDER — METRONIDAZOLE 500 MG PO TABS: 500.0000 mg | ORAL_TABLET | Freq: Three times a day (TID) | ORAL | 0 refills | Status: AC

## 2021-08-12 MED ORDER — CEFDINIR 300 MG PO CAPS: 300.0000 mg | ORAL_CAPSULE | Freq: Two times a day (BID) | ORAL | 0 refills | Status: AC

## 2021-08-12 NOTE — ED Triage Notes (Addendum)
EMS stated, he has diarrhea since Thursday and has caused him to be dizzy. Also has some left flank pain CBG 442 IV left forearm 20 g.- given 400cc

## 2021-08-12 NOTE — ED Provider Notes (Signed)
[facility] EMERGENCY DEPARTMENT Provider Note   CSN: [id number] Arrival date & time: 08/12/21  1136     History  Chief Complaint  Patient presents with   Diarrhea   Dizziness    Mark Fernandez is a 71 y.o. male. With past medical history of CAD, cardiomyopathy, HTN, DM who presents to the emergency department with diarrhea.  States that since Thursday he has had ongoing diarrhea. States that on Thursday he was having watery, non-bloody diarrhea every hour as well as increased urine production. States that he was having diffuse lower abdominal pain with diarrhea. States he continued to have diarrhea over the weekend including 2 episodes yesterday, and one episode this morning. He additionally endorses mild nausea without vomiting. Endorses dysuria. Endorses decreased PO intake. Endorses lightheadedness with standing. No syncope. Denies fevers, chest pain, shortness of breath.   Collateral history provided by Venida Jarvis (daughter) [phone]: She states that he has not been feeling good since Thursday.  She states that she would call him daily as she lives in [place] to check up on him.  She states that he has been having incontinence of bowel and urine which is not his normal.  States that he has had decreased p.o. intake since Friday, however has continued to take his insulin.  She states that he attempted some ginger ale yesterday but overall has been very weak.  She also states that when she has been calling him he "sounds delusional."  She states that "last time he was like this he had a UTI."  States that she went to check on him yesterday and he would not get up off the couch.  She attempted to get him to the emergency department however he refused.  She states that today she called him and he continued to sound somewhat disoriented so she called EMS.    Diarrhea Associated symptoms: abdominal pain   Associated symptoms: no fever and no vomiting    Dizziness Associated symptoms: diarrhea and nausea   Associated symptoms: no blood in stool, no chest pain, no shortness of breath and no vomiting       Home Medications Prior to Admission medications   Medication Sig Start Date End Date Taking? Authorizing Provider  albuterol (VENTOLIN HFA) 108 (90 Base) MCG/ACT inhaler Inhale 2 puffs into the lungs every 4 (four) hours as needed for wheezing or shortness of breath.  09/12/19   [provider]  aspirin EC 81 MG EC tablet Take 1 tablet (81 mg total) by mouth daily. 12/11/15   Cheryln Manly, NP  atorvastatin (LIPITOR) 80 MG tablet Take 1 tablet (80 mg total) by mouth daily at 6 PM. 04/15/18   Barrett, Evelene Croon, PA-C  carvedilol (COREG) 12.5 MG tablet Take 1 tablet (12.5 mg total) by mouth 2 (two) times daily with a meal. Patient not taking: Reported on 09/14/2019 04/15/18   Barrett, Evelene Croon, PA-C  cetirizine (ZYRTEC) 10 MG tablet Take 10 mg by mouth daily.    [provider]  insulin glargine (LANTUS) 100 UNIT/ML injection Inject 55 Units into the skin at bedtime.    [provider]  isosorbide mononitrate (IMDUR) 30 MG 24 hr tablet Take 1 tablet (30 mg total) by mouth daily. Patient not taking: Reported on 09/14/2019 04/15/18   Barrett, Evelene Croon, PA-C  liraglutide (VICTOZA) 18 MG/3ML SOPN Inject 1.2 mg into the skin daily.    [provider]  meclizine (ANTIVERT) 12.5 MG tablet Take 1 tablet (12.5  mg total) by mouth 2 (two) times daily as needed for dizziness. Patient not taking: Reported on 09/14/2019 05/25/18   Dewayne Hatch, MD  Multiple Vitamins-Minerals (MULTIVITAMIN WITH MINERALS) tablet Take 1 tablet by mouth daily.    [provider]  nitroGLYCERIN (NITROSTAT) 0.4 MG SL tablet Place 1 tablet (0.4 mg total) under the tongue every 5 (five) minutes x 3 doses as needed for chest pain. 04/15/18   Barrett, Evelene Croon, PA-C  pantoprazole (PROTONIX) 40 MG tablet Take 1 tablet (40 mg total)  by mouth daily. 04/15/18   Barrett, Evelene Croon, PA-C  potassium chloride SA (KLOR-CON) 20 MEQ tablet Take 20 mEq by mouth daily.    [provider]  prasugrel (EFFIENT) 10 MG TABS tablet Take 10 mg by mouth daily. 07/06/19   [provider]  tamsulosin (FLOMAX) 0.4 MG CAPS capsule Take 1 capsule (0.4 mg total) by mouth daily after supper. 05/18/18   Thurnell Lose, MD  torsemide (DEMADEX) 100 MG tablet Take 0.5 tablets (50 mg total) by mouth daily as needed (Weight gain/lower extremity edema/dyspnea). 09/22/19 10/22/19  Darliss Cheney, MD  TRELEGY ELLIPTA 100-62.5-25 MCG/INH AEPB Inhale 1 puff into the lungs daily. 09/12/19   [provider]      Allergies    Patient has no known allergies.    Review of Systems   Review of Systems  Constitutional:  Positive for appetite change. Negative for fever.  Respiratory:  Negative for shortness of breath.   Cardiovascular:  Negative for chest pain.  Gastrointestinal:  Positive for abdominal pain, diarrhea and nausea. Negative for blood in stool and vomiting.  Genitourinary:  Positive for dysuria. Negative for penile discharge and penile pain.  Neurological:  Positive for dizziness and light-headedness. Negative for syncope.  All other systems reviewed and are negative.  Physical Exam Updated Vital Signs BP 136/85    Pulse 100    Temp 98 F (36.7 C) (Oral)    Resp (!) 21    SpO2 98%  Physical Exam Vitals and nursing note reviewed.  Constitutional:      General: He is not in acute distress.    Appearance: Normal appearance. He is ill-appearing. He is not toxic-appearing.  HENT:     Head: Normocephalic and atraumatic.     Mouth/Throat:     Mouth: Mucous membranes are moist.     Pharynx: Oropharynx is clear.  Eyes:     General: No scleral icterus.    Extraocular Movements: Extraocular movements intact.     Pupils: Pupils are equal, round, and reactive to light.  Cardiovascular:     Rate and Rhythm: Normal rate and  regular rhythm.     Pulses: Normal pulses.     Heart sounds: No murmur heard. Pulmonary:     Effort: Pulmonary effort is normal. No respiratory distress.     Breath sounds: Normal breath sounds.  Abdominal:     General: Abdomen is protuberant. Bowel sounds are normal.     Palpations: Abdomen is soft.     Tenderness: There is no abdominal tenderness. There is no guarding or rebound. Negative signs include Murphy's sign and McBurney's sign.  Musculoskeletal:        General: Normal range of motion.     Cervical back: Neck supple. No tenderness.  Skin:    General: Skin is warm and dry.     Capillary Refill: Capillary refill takes less than 2 seconds.  Neurological:     General: No focal deficit present.  Mental Status: He is alert and oriented to person, place, and time. Mental status is at baseline.  Psychiatric:        Mood and Affect: Mood normal.        Behavior: Behavior normal.        Thought Content: Thought content normal.    ED Results / Procedures / Treatments   Labs (all labs ordered are listed, but only abnormal results are displayed) Labs Reviewed  COMPREHENSIVE METABOLIC PANEL - Abnormal; Notable for the following components:      Result Value   Glucose, Bld 319 (*)    BUN 34 (*)    Creatinine, Ser 2.67 (*)    Calcium 8.8 (*)    Total Protein 8.2 (*)    Albumin 3.3 (*)    GFR, Estimated 25 (*)    All other components within normal limits  CBC - Abnormal; Notable for the following components:   WBC 14.6 (*)    Hemoglobin 12.6 (*)    All other components within normal limits  URINALYSIS, ROUTINE W REFLEX MICROSCOPIC - Abnormal; Notable for the following components:   APPearance HAZY (*)    Glucose, UA >=500 (*)    Hgb urine dipstick MODERATE (*)    Leukocytes,Ua MODERATE (*)    WBC, UA >50 (*)    Bacteria, UA MANY (*)    All other components within normal limits  RESP PANEL BY RT-PCR (FLU A&B, COVID) [id number]  URINE CULTURE  LIPASE, BLOOD    EKG None  Radiology CT Abdomen Pelvis Wo Contrast  Result Date: 08/12/2021 CLINICAL DATA:  Abdominal pain, diarrhea since Thursday. Left flank pain. EXAM: CT ABDOMEN AND PELVIS WITHOUT CONTRAST TECHNIQUE: Multidetector CT imaging of the abdomen and pelvis was performed following the standard protocol without IV contrast. RADIATION DOSE REDUCTION: This exam was performed according to the departmental dose-optimization program which includes automated exposure control, adjustment of the mA and/or kV according to patient size and/or use of iterative reconstruction technique. COMPARISON:  December 09, 2015. FINDINGS: Lower chest: Partially visualized intracardiac leads. Prior median sternotomy. Bibasilar atelectasis versus scarring. Hepatobiliary: No suspicious hepatic lesion on this noncontrast examination. Gallbladder surgically absent. No biliary ductal dilation. Pancreas: No pancreatic ductal dilation or evidence of acute inflammation. Spleen: No splenomegaly. Adrenals/Urinary Tract: Bilateral adrenal glands appear normal. No hydronephrosis. Nonobstructive right lower pole renal stone measures 4 mm. Nonobstructive left interpolar renal stone measures 9 mm. No obstructive ureteral or bladder calculi identified. Urinary bladder is unremarkable for degree of distension. Stomach/Bowel: No enteric contrast was administered. Stomach is minimally distended limiting evaluation. No pathologic dilation of small or large bowel. The appendix and terminal ileum appear normal. Left-sided colonic diverticulosis with sigmoid colonic wall thickening and adjacent inflammation likely reflecting mild acute uncomplicated sigmoid diverticulitis/colitis. No pneumatosis. Vascular/Lymphatic: Aortic and branch vessel atherosclerosis without abdominal aortic aneurysm. No pathologically enlarged abdominal or pelvic lymph nodes. Reproductive: Prostate is unremarkable. Other: No pneumoperitoneum.  No walled off fluid collects.  Musculoskeletal: Multilevel degenerative changes spine with Schmorl's node at the L3-L4 disc space. IMPRESSION: 1. Left-sided colonic diverticulosis with sigmoid colonic wall thickening and adjacent inflammation likely reflecting mild acute uncomplicated sigmoid diverticulitis/colitis. 2. Bilateral nonobstructing nephrolithiasis. 3.  Aortic Atherosclerosis (ICD10-I70.0). Electronically Signed   By: Dahlia Bailiff M.D.   On: 08/12/2021 13:29    Procedures Procedures   Medications Ordered in ED Medications  sodium chloride 0.9 % bolus 500 mL (0 mLs Intravenous Stopped 08/12/21 1529)  cefTRIAXone (ROCEPHIN) 1 g in sodium chloride  0.9 % 100 mL IVPB (0 g Intravenous Stopped 08/12/21 1618)  metroNIDAZOLE (FLAGYL) tablet 500 mg (500 mg Oral Given 08/12/21 1546)   ED Course/ Medical Decision Making/ A&P                           Medical Decision Making Amount and/or Complexity of Data Reviewed Labs: ordered. Radiology: ordered.  Risk Prescription drug management.  Patient presents to the ED with complaints of abdominal pain. This involves an extensive number of treatment options, and is a complaint that carries with it a high risk of complications and morbidity.   Additional history obtained:  Additional history obtained from: External records from outside source obtained and reviewed including:  EKG: EKG: paced, LBBB, prolonged QT interval.   Cardiac Monitoring: The patient was maintained on a cardiac monitor.  I personally viewed and interpreted the cardiac monitored which showed an underlying rhythm of: sinus rhythm   Lab Results: I personally ordered, reviewed, and interpreted labs. Pertinent results include: CBC with leukocytosis to 14.6, hemoglobin 12.6 (improved from remote results) CMP with glucose [id number] without metabolic acidosis  Creatinine 2.67, appears to chronic but increasing from last values  Lipase 35, negative  UA with evidence of possible UTI -> culture sent  COVID and  flu negative  Imaging Studies ordered:  I ordered imaging studies which included CT.  I independently reviewed & interpreted imaging & am in agreement with radiology impression. Imaging shows: Left-sided colonic diverticulosis with sigmoid colonic wall thickening and adjacent inflammation likely reflecting mild acute uncomplicated sigmoid diverticulitis  Medications  I ordered medication including 500 mL fluids for dehydration, Rocephin and Flagyl for UTI, diverticulitis Reevaluation of the patient after medication shows that patient  n/a  ED Course: 71 year old male who presents emergency department with nonbloody diarrhea.  Additionally presents with dysuria.  Based on his history, exam doubt invasive bacterial causing diarrhea such as C. difficile as he has had no recent antibiotics.  Shiga toxin no nonbloody diarrhea.  No recent travel.  Patient is not immunocompromise.  Diarrhea nonbloody/likely IBS.  Given his history, low suspicion for partial SBO, appendicitis.  CT abdomen pelvis shows that he does have mild diverticulitis which may be the cause of his diarrhea.  He does have leukocytosis to 14.6, given first dose of Flagyl here for diverticulitis. He additionally endorsed dysuria.  His UA is consistent with possible UTI as he has moderate leukocytes, many bacteria, will WBC greater than 50.  I sent this off for culture however we will empirically treat.  Started him on 1 g Rocephin here in the emergency department.  CT without stones so doubt infected obstructed stone.  Doubt pyelonephritis at this time.  Patient is nonseptic in appearance.  His creatinine is elevated 2.67 which appears to be chronically elevated.  He may have some prerenal dehydration contributing, however think that this is most consistent with chronic kidney disease.  He was given 500 mL of fluid here in the emergency department.  Given over 2 hours given his history of heart failure.  He tolerated this  appropriately.  Spoke with pharmacy here in the emergency department regarding antibiotic coverage for him given that he has UTI and diverticulitis.  The recommended Flagyl as well as cefdinir over the next 10 days.  Will prescribe both of these to the patient.  He received first dose of medications here in the emergency department.  Overall feel that he is appropriate for discharge as  he is nonseptic in appearance.  He is afebrile, without an acute abdomen on exam.  Tolerating p.o.  Patient was able to ambulate unassisted with a cane up and down the hall here in the emergency department.  He was also able to pee spontaneously on his own. Dr. Reather Converse, attending, assessed patient as well and feel he is appropriate for outpatient antibiotics and close follow-up with primary care provider.  Patient is agreeable to this plan. Given return precautions for worsening abdominal pain or diarrhea despite antibiotic use, inability to tolerate p.o.  After consideration of the diagnostic results and the patients response to treatment, I feel that the patent would benefit from discharge.  Considered admission given age and diverticulitis however the patient is nonseptic in appearance and able to tolerate p.o. so will be appropriate for p.o. antibiotics outpatient. The patient has been appropriately medically screened and/or stabilized in the ED. I have low suspicion for any other emergent medical condition which would require further screening, evaluation or treatment in the ED or require inpatient management. The patient is overall well appearing and non-toxic in appearance. They are hemodynamically stable at time of discharge.   Final Clinical Impression(s) / ED Diagnoses Final diagnoses:  Diverticulitis  Cystitis    Rx / DC Orders ED Discharge Orders          Ordered    cefdinir (OMNICEF) 300 MG capsule  2 times daily        08/12/21 1701    metroNIDAZOLE (FLAGYL) 500 MG tablet  Every 8 hours        08/12/21  1701              Mickie Hillier, PA-C 08/12/21 1707    Elnora Morrison, MD 08/18/21 0001

## 2021-08-12 NOTE — ED Notes (Signed)
Pt transported to CT via stretcher at this time.  

## 2021-08-12 NOTE — ED Notes (Signed)
Pt ambulated in the hallway without assistance. Pt exhibits strong, steady gait. Denies SOB or dizziness. Pt was also able to urinate 200 mL. RN made aware.

## 2021-08-12 NOTE — ED Notes (Signed)
AVS with prescriptions provided to and discussed with patient and family members at bedside. Pt verbalizes understanding of discharge instructions and denies any questions or concerns at this time. Pt has ride home. Pt ambulated out of department independently with steady gait.  

## 2021-08-12 NOTE — ED Notes (Signed)
Lab called to add on urine culture.  ?

## 2021-08-12 NOTE — Discharge Instructions (Signed)
You were seen in the emergency department today for diarrhea and pain when you urinate.  You were found to have a UTI and a condition called diverticulitis.  This is inflammation of the colon which may be the cause of your diarrhea.  We are giving you 2 antibiotics which she will take over the next 10 days.  Please pick up these antibiotics and begin them in the morning.  You will take cefdinir twice a day over the next 10 days and you will take Flagyl every 8 hours for the next 10 days.  You have been prescribed an antibiotic called metronidazole, also known as Flagyl. Some side effects of metronidazole you should be aware of are nausea and headache. Take the medication with food to minimize stomach upset. It is imperative that you do not drink alcohol while taking this antibiotic. Please take the medication as prescribed. Please take the medication until your antibiotic course is complete.  Please return to the emergency department for worsening abdominal pain, fevers, inability to tolerate food or water due to nausea or vomiting.  Otherwise please follow-up with your primary care provider.

## 2021-08-14 LAB — URINE CULTURE: Culture: >=100000 — AB

## 2021-08-15 ENCOUNTER — Telehealth: Payer: Self-pay | Admitting: Emergency Medicine

## 2021-08-15 NOTE — Telephone Encounter (Signed)
Post ED Visit - Positive Culture Follow-up  Culture report reviewed by antimicrobial stewardship pharmacist: [facility] Team []  Elenor Quinones, Pharm.D. []  Heide Guile, Pharm.D., BCPS AQ-ID []  Parks Neptune, Pharm.D., BCPS []  Alycia Rossetti, Pharm.D., BCPS []  [place], Pharm.D., BCPS, AAHIVP []  Legrand Como, Pharm.D., BCPS, AAHIVP []  Salome Arnt, PharmD, BCPS []  Johnnette Gourd, PharmD, BCPS []  Hughes Better, PharmD, BCPS []  Leeroy Cha, PharmD []  Laqueta Linden, PharmD, BCPS []  Albertina Parr, PharmD  [place] Team []  Leodis Sias, PharmD []  Lindell Spar, PharmD []  Royetta Asal, PharmD []  Graylin Shiver, Rph []  Rema Fendt) Glennon Mac, PharmD []  Arlyn Dunning, PharmD []  Netta Cedars, PharmD []  Dia Sitter, PharmD []  Leone Haven, PharmD []  Gretta Arab, PharmD []  Theodis Shove, PharmD []  Peggyann Juba, PharmD []  Reuel Boom, PharmD   Positive urine culture Treated with cefdinir, organism sensitive to the same and no further patient follow-up is required at this time.  Hazle Nordmann 08/15/2021, 10:34 AM
